# Patient Record
Sex: Female | Born: 1980 | Race: Black or African American | Hispanic: No | Marital: Single | Smoking: Former smoker
Health system: Southern US, Community
[De-identification: ages and names within clinical notes are randomized; demographics above are authoritative.]

## PROBLEM LIST (undated history)

## (undated) DIAGNOSIS — E876 Hypokalemia: Secondary | ICD-10-CM

## (undated) DIAGNOSIS — K5790 Diverticulosis of intestine, part unspecified, without perforation or abscess without bleeding: Secondary | ICD-10-CM

## (undated) DIAGNOSIS — K209 Esophagitis, unspecified without bleeding: Secondary | ICD-10-CM

## (undated) DIAGNOSIS — G43909 Migraine, unspecified, not intractable, without status migrainosus: Secondary | ICD-10-CM

## (undated) DIAGNOSIS — E669 Obesity, unspecified: Secondary | ICD-10-CM

## (undated) DIAGNOSIS — F419 Anxiety disorder, unspecified: Secondary | ICD-10-CM

## (undated) DIAGNOSIS — Z8489 Family history of other specified conditions: Secondary | ICD-10-CM

## (undated) DIAGNOSIS — Z8719 Personal history of other diseases of the digestive system: Secondary | ICD-10-CM

## (undated) DIAGNOSIS — J302 Other seasonal allergic rhinitis: Secondary | ICD-10-CM

## (undated) DIAGNOSIS — K449 Diaphragmatic hernia without obstruction or gangrene: Secondary | ICD-10-CM

## (undated) DIAGNOSIS — F329 Major depressive disorder, single episode, unspecified: Secondary | ICD-10-CM

## (undated) DIAGNOSIS — J45909 Unspecified asthma, uncomplicated: Secondary | ICD-10-CM

## (undated) DIAGNOSIS — M1990 Unspecified osteoarthritis, unspecified site: Secondary | ICD-10-CM

## (undated) HISTORY — DX: Esophagitis, unspecified without bleeding: K20.90

## (undated) HISTORY — DX: Hypokalemia: E87.6

## (undated) HISTORY — DX: Anxiety disorder, unspecified: F41.9

## (undated) HISTORY — DX: Diaphragmatic hernia without obstruction or gangrene: K44.9

## (undated) HISTORY — DX: Personal history of other diseases of the digestive system: Z87.19

## (undated) HISTORY — DX: Unspecified osteoarthritis, unspecified site: M19.90

## (undated) HISTORY — PX: WISDOM TOOTH EXTRACTION: SHX21

## (undated) HISTORY — DX: Major depressive disorder, single episode, unspecified: F32.9

## (undated) HISTORY — DX: Migraine, unspecified, not intractable, without status migrainosus: G43.909

## (undated) HISTORY — PX: BREAST BIOPSY: SHX20

## (undated) HISTORY — DX: Diverticulosis of intestine, part unspecified, without perforation or abscess without bleeding: K57.90

---
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
## 2011-10-10 DIAGNOSIS — Z8719 Personal history of other diseases of the digestive system: Secondary | ICD-10-CM

## 2011-10-10 HISTORY — DX: Personal history of other diseases of the digestive system: Z87.19

## 2012-10-09 DIAGNOSIS — M1990 Unspecified osteoarthritis, unspecified site: Secondary | ICD-10-CM

## 2012-10-09 HISTORY — DX: Unspecified osteoarthritis, unspecified site: M19.90

## 2012-10-09 NOTE — L&D Delivery Note (Signed)
Delivery Note At 1106 I was notified pt had developed increased bp's 140s-160s/60s-80s, with one outlying bp of 177/122. Pt also feeling lots of pressure and breathing/pushing w/ uc's. Pre-e labs and labetalol prn were ordered. She did receive 1 dose of labetalol 20mg  iv just prior to delivery. Denies  scotomata, ruq/epigastric pain, n/v. Reports having frontal ha since last night, but thinks it's r/t allergies. DTRs 2+, no clonus, trace BLE edema. At 11:35 AM a viable female was delivered via Vaginal, Spontaneous Delivery (Presentation:Left Occiput Anterior).  APGAR: 8, 9; weight: not available at time of note.   Placenta status: Intact, Spontaneous.  Cord:  with the following complications: hyperspiraled.    Anesthesia: Epidural  Episiotomy: n/a Lacerations: small hemostatic 1st degree bilateral periurethral, no repair needed Suture Repair: n/a Est. Blood Loss (mL):  Mom to postpartum.  Baby to Couplet care / Skin to Skin. Plans to breastfeed, undecided about contraception, desires OP circumcision.  Marge Duncans 08/23/2013, 11:51 AM

## 2012-12-20 ENCOUNTER — Emergency Department (HOSPITAL_COMMUNITY)
Admission: EM | Admit: 2012-12-20 | Discharge: 2012-12-20 | Disposition: A | Discharge status: Home / Self Care | Payer: Medicaid Other | Attending: Emergency Medicine | Admitting: Emergency Medicine

## 2012-12-20 ENCOUNTER — Encounter (HOSPITAL_COMMUNITY): Payer: Self-pay | Admitting: *Deleted

## 2012-12-20 DIAGNOSIS — R350 Frequency of micturition: Secondary | ICD-10-CM | POA: Insufficient documentation

## 2012-12-20 DIAGNOSIS — R112 Nausea with vomiting, unspecified: Secondary | ICD-10-CM | POA: Insufficient documentation

## 2012-12-20 DIAGNOSIS — E669 Obesity, unspecified: Secondary | ICD-10-CM | POA: Insufficient documentation

## 2012-12-20 DIAGNOSIS — R109 Unspecified abdominal pain: Secondary | ICD-10-CM

## 2012-12-20 DIAGNOSIS — R197 Diarrhea, unspecified: Secondary | ICD-10-CM | POA: Insufficient documentation

## 2012-12-20 DIAGNOSIS — R1013 Epigastric pain: Secondary | ICD-10-CM | POA: Insufficient documentation

## 2012-12-20 HISTORY — DX: Other seasonal allergic rhinitis: J30.2

## 2012-12-20 HISTORY — DX: Obesity, unspecified: E66.9

## 2012-12-20 LAB — URINALYSIS, ROUTINE W REFLEX MICROSCOPIC
Ketones, ur: 15 mg/dL — AB
Leukocytes, UA: NEGATIVE
Nitrite: NEGATIVE
Urobilinogen, UA: 0.2 mg/dL (ref 0.0–1.0)
pH: 6 (ref 5.0–8.0)

## 2012-12-20 MED ORDER — GI COCKTAIL ~~LOC~~
30.0000 mL | Freq: Once | ORAL | Status: AC
Start: 2012-12-20 — End: 2012-12-20
  Administered 2012-12-20: 30 mL via ORAL
  Filled 2012-12-20: qty 30

## 2012-12-20 MED ORDER — ONDANSETRON 4 MG PO TBDP
4.0000 mg | ORAL_TABLET | Freq: Once | ORAL | Status: AC
Start: 2012-12-20 — End: 2012-12-20
  Administered 2012-12-20: 4 mg via ORAL
  Filled 2012-12-20: qty 1

## 2012-12-20 MED ORDER — PROMETHAZINE HCL 25 MG PO TABS: 25.0000 mg | ORAL_TABLET | Freq: Four times a day (QID) | ORAL | Status: DC | PRN

## 2012-12-20 NOTE — ED Provider Notes (Signed)
History     CSN: [id number]  Arrival date & time 12/20/12  4098   First MD Initiated Contact with Patient 12/20/12 [phone]      Chief Complaint  Patient presents with  . Abdominal Pain  . Diarrhea    (Consider location/radiation/quality/duration/timing/severity/associated sxs/prior treatment) HPI Comments: Patient presents with a chief complaint of nausea, vomiting, and diarrhea.  She is also having some mild intermittent epigastric pain.  She describes the pain as a crampy pain.  Pain does not radiate.  Symptoms started yesterday.  She reports that she has had several episodes of diarrhea and one episode of vomiting.  No blood in her emesis or blood in her stool.  She has not taken anything for her symptoms prior to arrival.  She denies fever or chills.   She has some increased urinary frequency, but no dysuria or hematuria.  No prior history of abdominal surgeries.    Patient is a 32 y.o. female presenting with diarrhea. The history is provided by the patient.  Diarrhea Associated symptoms: abdominal pain and vomiting   Associated symptoms: no chills and no fever     Past Medical History  Diagnosis Date  . Obesity   . Seasonal allergies     History reviewed. No pertinent past surgical history.  History reviewed. No pertinent family history.  History  Substance Use Topics  . Smoking status: Not on file  . Smokeless tobacco: Not on file  . Alcohol Use: No    OB History   Grav Para Term Preterm Abortions TAB SAB Ect Mult Living                  Review of Systems  Constitutional: Negative for fever and chills.  Gastrointestinal: Positive for nausea, vomiting, abdominal pain and diarrhea. Negative for constipation, blood in stool and abdominal distention.  Genitourinary: Positive for frequency. Negative for urgency and pelvic pain.  All other systems reviewed and are negative.    Allergies  Chocolate; Coffee bean extract; Soy allergy; Tea; and Yeast-related  products  Home Medications   Current Outpatient Rx  Name  Route  Sig  Dispense  Refill  . naphazoline-pheniramine (NAPHCON-A) 0.025-0.3 % ophthalmic solution   Both Eyes   Place 1 drop into both eyes 4 (four) times daily as needed.         . naproxen sodium (ANAPROX) 220 MG tablet   Oral   Take 440 mg by mouth 2 (two) times daily as needed.           BP 107/59  Pulse 80  Temp(Src) 98 F (36.7 C) (Oral)  SpO2 97%  LMP 11/17/2012  Physical Exam  Nursing note and vitals reviewed. Constitutional: She appears well-developed and well-nourished. No distress.  HENT:  Head: Normocephalic and atraumatic.  Mouth/Throat: Oropharynx is clear and moist.  Neck: Normal range of motion. Neck supple.  Cardiovascular: Normal rate, regular rhythm and normal heart sounds.   Pulmonary/Chest: Effort normal and breath sounds normal.  Abdominal: Soft. Bowel sounds are normal. She exhibits no distension and no mass. There is no rebound, no guarding, no CVA tenderness, no tenderness at McBurney's point and negative Murphy's sign.  Mild epigastric tenderness to palpation  Musculoskeletal: Normal range of motion.  Neurological: She is alert.  Skin: Skin is warm and dry. She is not diaphoretic.  Psychiatric: She has a normal mood and affect.    ED Course  Procedures (including critical care time)  Labs Reviewed  URINALYSIS, ROUTINE W REFLEX  MICROSCOPIC - Abnormal; Notable for the following:    Ketones, ur 15 (*)    All other components within normal limits   No results found.   No diagnosis found.  Patient able to tolerate PO liquids.  On re examination patient did not have any tenderness to palpation of the abdomen.  MDM  Patient presents today with a chief complaint of nausea, vomiting, and diarrhea.  Symptoms improved while in the ED.  No rebound or guarding on physical exam.  Patient able to tolerate PO liquids.  Patient discharged home with Rx for Phenergan.  Return precautions  discussed.          Pascal Lux Cecil, PA-C 12/21/12 1719

## 2012-12-20 NOTE — ED Notes (Signed)
Patient given urine cup to get sample per PA request.

## 2012-12-20 NOTE — ED Notes (Signed)
AIDET performed. 

## 2012-12-20 NOTE — ED Notes (Signed)
Pt ambulatory leaving ED; pt alert and mentating appropriately upon d/c teaching and prescription teaching; pt given d/c teaching and prescription; pt verbalizes understanding of d/c teaching and prescription, pt has no further questions upon d/c. NAD noted upon d/c.

## 2012-12-20 NOTE — ED Notes (Signed)
Mid abd pain that started yesterday, having nausea and mild diarrhea.

## 2012-12-20 NOTE — ED Notes (Signed)
Pt given ginger ale in attempt to PO challenge pt; pt informed to call out if gets nauseous; pt denies nausea currently; pt alert and mentating appropriately.

## 2012-12-20 NOTE — ED Notes (Signed)
Pt states she has been able to keep some of the ginger ale down

## 2012-12-22 NOTE — ED Provider Notes (Signed)
Medical screening examination/treatment/procedure(s) were performed by non-physician practitioner and as supervising physician I was immediately available for consultation/collaboration.  Flint Melter, MD 12/22/12 [phone]

## 2013-01-01 ENCOUNTER — Emergency Department (HOSPITAL_COMMUNITY)
Admission: EM | Admit: 2013-01-01 | Discharge: 2013-01-01 | Disposition: A | Discharge status: Home / Self Care | Payer: Medicaid Other | Attending: Emergency Medicine | Admitting: Emergency Medicine

## 2013-01-01 ENCOUNTER — Emergency Department (HOSPITAL_COMMUNITY): Payer: Medicaid Other

## 2013-01-01 ENCOUNTER — Encounter (HOSPITAL_COMMUNITY): Payer: Self-pay

## 2013-01-01 DIAGNOSIS — R109 Unspecified abdominal pain: Secondary | ICD-10-CM | POA: Insufficient documentation

## 2013-01-01 DIAGNOSIS — Z3201 Encounter for pregnancy test, result positive: Secondary | ICD-10-CM | POA: Insufficient documentation

## 2013-01-01 DIAGNOSIS — Z3490 Encounter for supervision of normal pregnancy, unspecified, unspecified trimester: Secondary | ICD-10-CM

## 2013-01-01 DIAGNOSIS — E669 Obesity, unspecified: Secondary | ICD-10-CM | POA: Insufficient documentation

## 2013-01-01 DIAGNOSIS — R3589 Other polyuria: Secondary | ICD-10-CM | POA: Insufficient documentation

## 2013-01-01 DIAGNOSIS — R358 Other polyuria: Secondary | ICD-10-CM | POA: Insufficient documentation

## 2013-01-01 LAB — URINALYSIS, ROUTINE W REFLEX MICROSCOPIC
Bilirubin Urine: NEGATIVE
Hgb urine dipstick: NEGATIVE
Ketones, ur: NEGATIVE mg/dL
Nitrite: NEGATIVE
Protein, ur: NEGATIVE mg/dL
Specific Gravity, Urine: 1.023 (ref 1.005–1.030)
Urobilinogen, UA: 0.2 mg/dL (ref 0.0–1.0)

## 2013-01-01 LAB — WET PREP, GENITAL: Trich, Wet Prep: NONE SEEN

## 2013-01-01 LAB — HCG, QUANTITATIVE, PREGNANCY: hCG, Beta Chain, Quant, S: 44581 m[IU]/mL — ABNORMAL HIGH (ref <5)

## 2013-01-01 MED ORDER — PRENATAL VITAMINS 0.8 MG PO TABS: 1.0000 | ORAL_TABLET | Freq: Every day | ORAL | Status: DC

## 2013-01-01 NOTE — ED Notes (Signed)
Pt returned from ultrasound.   Placed back on monitor.

## 2013-01-01 NOTE — ED Notes (Signed)
Lower abdominal pain for 1 month, bilateral breast pain , sore to touch,  Pt. Is voiding a lot, denies any dysuria. Pt. Is late for her period , 16 days.  Pt, is nauseated  Denies any vomiting.

## 2013-01-01 NOTE — ED Notes (Signed)
Pt called for triage x3 

## 2013-01-01 NOTE — ED Provider Notes (Signed)
History     CSN: [id number]  Arrival date & time 01/01/13  1538   First MD Initiated Contact with Patient 01/01/13 1657      Chief Complaint  Patient presents with  . Breast Pain    (Consider location/radiation/quality/duration/timing/severity/associated sxs/prior treatment) HPI Complete of nausea, intermittent low abdominal pain for 2 weeks patient reports breast tenderness for several weeks, patient reports that she is late for her menstrual period. Last normal limits her. Presently 6 weeks ago. She had an episode of spotting yesterday. Other associated symptoms include polyuria. No treatment prior to coming here nothing makes symptoms better or worse she denies abdominal discomfort presently. Patient states blood type is B. positive Past Medical History  Diagnosis Date  . Obesity   . Seasonal allergies     History reviewed. No pertinent past surgical history.  No family history on file.  History  Substance Use Topics  . Smoking status: Never Smoker   . Smokeless tobacco: Not on file  . Alcohol Use: No    OB History   Grav Para Term Preterm Abortions TAB SAB Ect Mult Living                  Review of Systems  Constitutional: Negative.   HENT: Negative.   Respiratory: Negative.   Cardiovascular: Negative.   Gastrointestinal: Positive for abdominal pain.  Genitourinary:       Late for menses, breast tenderness,  Musculoskeletal: Negative.   Skin: Negative.   Neurological: Negative.   Psychiatric/Behavioral: Negative.   All other systems reviewed and are negative.    Allergies  Chocolate; Coffee bean extract; Soy allergy; Tea; and Yeast-related products  Home Medications  No current outpatient prescriptions on file.  BP 134/79  Pulse 63  Temp(Src) 98.4 F (36.9 C) (Oral)  Resp 16  SpO2 100%  LMP 11/18/2012  Physical Exam  Nursing note and vitals reviewed. Constitutional: She appears well-developed and well-nourished.  HENT:  Head: Normocephalic  and atraumatic.  Eyes: Conjunctivae are normal. Pupils are equal, round, and reactive to light.  Neck: Neck supple. No tracheal deviation present. No thyromegaly present.  Cardiovascular: Normal rate and regular rhythm.   No murmur heard. Pulmonary/Chest: Effort normal and breath sounds normal.  Abdominal: Soft. Bowel sounds are normal. She exhibits no distension. There is no tenderness.  Genitourinary: Vagina normal.  No external lesion. Minimal white discharge in vault. No blood in vault. Cervical os closed no cervical motion tenderness no adnexal masses or tenderness  Musculoskeletal: Normal range of motion. She exhibits no edema and no tenderness.  Neurological: She is alert. Coordination normal.  Skin: Skin is warm and dry. No rash noted.  Psychiatric: She has a normal mood and affect.    ED Course  Procedures (including critical care time)  Labs Reviewed  POCT PREGNANCY, URINE - Abnormal; Notable for the following:    Preg Test, Ur POSITIVE (*)    All other components within normal limits  URINALYSIS, ROUTINE W REFLEX MICROSCOPIC   No results found.   No diagnosis found. Results for orders placed during the hospital encounter of 01/01/13  WET PREP, GENITAL      Result Value Range   Yeast Wet Prep HPF POC NONE SEEN  NONE SEEN   Trich, Wet Prep NONE SEEN  NONE SEEN   Clue Cells Wet Prep HPF POC FEW (*) NONE SEEN   WBC, Wet Prep HPF POC MANY (*) NONE SEEN  URINALYSIS, ROUTINE W REFLEX MICROSCOPIC  Result Value Range   Color, Urine YELLOW  YELLOW   APPearance CLEAR  CLEAR   Specific Gravity, Urine 1.023  1.005 - 1.030   pH 6.5  5.0 - 8.0   Glucose, UA NEGATIVE  NEGATIVE mg/dL   Hgb urine dipstick NEGATIVE  NEGATIVE   Bilirubin Urine NEGATIVE  NEGATIVE   Ketones, ur NEGATIVE  NEGATIVE mg/dL   Protein, ur NEGATIVE  NEGATIVE mg/dL   Urobilinogen, UA 0.2  0.0 - 1.0 mg/dL   Nitrite NEGATIVE  NEGATIVE   Leukocytes, UA NEGATIVE  NEGATIVE  HCG, QUANTITATIVE, PREGNANCY       Result Value Range   hCG, Beta Chain, Quant, S 44581 (*) <5 mIU/mL  POCT PREGNANCY, URINE      Result Value Range   Preg Test, Ur POSITIVE (*) NEGATIVE   US Ob Comp Less 14 Wks  01/01/2013  *RADIOLOGY REPORT*  Clinical Data: Lower abdominal pain, positive pregnancy test  OBSTETRIC <14 WK [name] AND TRANSVAGINAL OB US  Technique:  Both transabdominal and transvaginal ultrasound examinations were performed for complete evaluation of the gestation as well as the maternal uterus, adnexal regions, and pelvic cul-de-sac.  Transvaginal technique was performed to assess early pregnancy.  Comparison:  None.  Intrauterine gestational sac:  Visualized/normal in shape. Yolk sac: Visualized Embryo: Visualized Cardiac Activity: Visualized Heart Rate: 130 bpm  CRL: 7  mm  6 w  4 d          [name] EDC: 08/23/13  Maternal uterus/adnexae: The ovaries are normal.  Small amount of free fluid identified.  IMPRESSION: Intrauterine gestational sac, yolk sac, fetal pole, and cardiac activity noted.  Today's measurement by crown-rump length of 6 weeks 4 days gestational age is concordant with assigned gestational age by LMP of 6 weeks 2 days.  EDC by LMP 08/25/2013.   Original Report Authenticated By: Christiana Pellant, M.D.    US Ob Transvaginal  01/01/2013  *RADIOLOGY REPORT*  Clinical Data: Lower abdominal pain, positive pregnancy test  OBSTETRIC <14 WK [name] AND TRANSVAGINAL OB US  Technique:  Both transabdominal and transvaginal ultrasound examinations were performed for complete evaluation of the gestation as well as the maternal uterus, adnexal regions, and pelvic cul-de-sac.  Transvaginal technique was performed to assess early pregnancy.  Comparison:  None.  Intrauterine gestational sac:  Visualized/normal in shape. Yolk sac: Visualized Embryo: Visualized Cardiac Activity: Visualized Heart Rate: 130 bpm  CRL: 7  mm  6 w  4 d          [name] EDC: 08/23/13  Maternal uterus/adnexae: The ovaries are normal.  Small amount of free fluid  identified.  IMPRESSION: Intrauterine gestational sac, yolk sac, fetal pole, and cardiac activity noted.  Today's measurement by crown-rump length of 6 weeks 4 days gestational age is concordant with assigned gestational age by LMP of 6 weeks 2 days.  EDC by LMP 08/25/2013.   Original Report Authenticated By: Christiana Pellant, M.D.     8:35 PM resting comfortable  MDM  Plan referral women's health clinic Prescription prenatal vitamins Diagnosis intrauterine pregnancy        Doug Sou, MD 01/01/13 2041

## 2013-01-01 NOTE — ED Notes (Signed)
Pt called for triage x2 

## 2013-01-01 NOTE — ED Notes (Signed)
Patient with child in [facility] ED

## 2013-01-02 LAB — GC/CHLAMYDIA PROBE AMP: CT Probe RNA: NEGATIVE

## 2013-01-21 ENCOUNTER — Other Ambulatory Visit: Payer: Medicaid Other

## 2013-01-21 DIAGNOSIS — Z3201 Encounter for pregnancy test, result positive: Secondary | ICD-10-CM

## 2013-01-21 LAB — HIV ANTIBODY (ROUTINE TESTING W REFLEX): HIV: NONREACTIVE

## 2013-01-22 ENCOUNTER — Other Ambulatory Visit: Payer: Medicaid Other

## 2013-01-22 LAB — OBSTETRIC PANEL
Basophils Absolute: 0 10*3/uL (ref 0.0–0.1)
Eosinophils Relative: 1 % (ref 0–5)
Hepatitis B Surface Ag: NEGATIVE
Lymphocytes Relative: 22 % (ref 12–46)
Lymphs Abs: 1.6 10*3/uL (ref 0.7–4.0)
Neutro Abs: 5.1 10*3/uL (ref 1.7–7.7)
Neutrophils Relative %: 70 % (ref 43–77)
Platelets: 229 10*3/uL (ref 150–400)
RBC: 4.5 MIL/uL (ref 3.87–5.11)
RDW: 13.9 % (ref 11.5–15.5)
Rubella: 2.77 Index — ABNORMAL HIGH (ref <0.90)
WBC: 7.2 10*3/uL (ref 4.0–10.5)

## 2013-01-23 LAB — HEMOGLOBINOPATHY EVALUATION
Hemoglobin Other: 0 %
Hgb F Quant: 0 % (ref 0.0–2.0)
Hgb S Quant: 0 %

## 2013-02-18 ENCOUNTER — Encounter: Payer: Self-pay | Admitting: Obstetrics & Gynecology

## 2013-02-18 ENCOUNTER — Other Ambulatory Visit: Payer: Self-pay | Admitting: Obstetrics & Gynecology

## 2013-02-18 ENCOUNTER — Other Ambulatory Visit (HOSPITAL_COMMUNITY)
Admission: RE | Admit: 2013-02-18 | Discharge: 2013-02-18 | Disposition: A | Discharge status: Home / Self Care | Payer: Medicaid Other | Source: Ambulatory Visit | Attending: Obstetrics & Gynecology | Admitting: Obstetrics & Gynecology

## 2013-02-18 ENCOUNTER — Ambulatory Visit (INDEPENDENT_AMBULATORY_CARE_PROVIDER_SITE_OTHER): Payer: Medicaid Other | Admitting: Obstetrics & Gynecology

## 2013-02-18 VITALS — BP 114/77 | Temp 98.1°F | Ht 61.0 in | Wt 203.7 lb

## 2013-02-18 DIAGNOSIS — Z3481 Encounter for supervision of other normal pregnancy, first trimester: Secondary | ICD-10-CM

## 2013-02-18 DIAGNOSIS — Z01419 Encounter for gynecological examination (general) (routine) without abnormal findings: Secondary | ICD-10-CM | POA: Insufficient documentation

## 2013-02-18 DIAGNOSIS — Z1151 Encounter for screening for human papillomavirus (HPV): Secondary | ICD-10-CM | POA: Insufficient documentation

## 2013-02-18 DIAGNOSIS — Z113 Encounter for screening for infections with a predominantly sexual mode of transmission: Secondary | ICD-10-CM | POA: Insufficient documentation

## 2013-02-18 DIAGNOSIS — Z3480 Encounter for supervision of other normal pregnancy, unspecified trimester: Secondary | ICD-10-CM | POA: Insufficient documentation

## 2013-02-18 LAB — POCT URINALYSIS DIP (DEVICE)
Bilirubin Urine: NEGATIVE
Hgb urine dipstick: NEGATIVE
Ketones, ur: NEGATIVE mg/dL
Nitrite: NEGATIVE
Protein, ur: 30 mg/dL — AB
pH: 6.5 (ref 5.0–8.0)

## 2013-02-18 LAB — GLUCOSE TOLERANCE, 1 HOUR (50G) W/O FASTING: Glucose, 1 Hour GTT: 121 mg/dL (ref 70–140)

## 2013-02-18 NOTE — Progress Notes (Signed)
Informal [name] for viability- +FM, +cardiac, FHR 158 per m-mode.

## 2013-02-18 NOTE — Progress Notes (Signed)
Pulse- 78  Pain/pressure- lower abd then shots to vaginal area New ob packet given  Weight gain 11-20lb  Declined flu vaccine

## 2013-02-18 NOTE — Progress Notes (Signed)
Subjective:    Lynn Wilcox is being seen today for her first obstetrical visit.  This is not a planned pregnancy. She is at [redacted]w[redacted]d gestation. Her obstetrical history is significant for obesity. Relationship with FOB: significant other, not living together. Patient does not intend to breast feed. Pregnancy history fully reviewed.  Menstrual History: OB History   Grav Para Term Preterm Abortions TAB SAB Ect Mult Living   2 1 1       1       Patient's last menstrual period was 11/18/2012.    The following portions of the patient's history were reviewed and updated as appropriate: allergies, current medications, past family history, past medical history, past social history, past surgical history and problem list.  Review of Systems Pertinent items are noted in HPI.    Objective:    BP 114/77  Temp(Src) 98.1 F (36.7 C)  Ht 5\' 1"  (1.549 m)  Wt 203 lb 11.2 oz (92.398 kg)  BMI 38.51 kg/m2  LMP 11/18/2012  General Appearance:    Alert, cooperative, no distress, appears stated age  Head:    Normocephalic, without obvious abnormality, atraumatic           Throat:   Lips, mucosa, and tongue normal; teeth and gums normal  Neck:   Supple, symmetrical, trachea midline, no adenopathy;    thyroid:  no enlargement/tenderness/nodules; no carotid   bruit or JVD  Back:     Symmetric, no curvature, ROM normal, no CVA tenderness  Lungs:     Clear to auscultation bilaterally, respirations unlabored  Chest Wall:    No tenderness or deformity   Heart:    Regular rate and rhythm, S1 and S2 normal, no murmur, rub   or gallop  Breast Exam:    No tenderness, masses, or nipple abnormality  Abdomen:     Soft, non-tender, bowel sounds active all four quadrants,    no masses, no organomegaly  Genitalia:    Normal female without lesion, discharge or tenderness: uterus ~16 weeks size difficult to assess due to body habitus     Extremities:   Extremities normal, atraumatic, no cyanosis or edema  Pulses:    2+ and symmetric all extremities  Skin:   Skin color, texture, turgor normal, no rashes or lesions  Lymph nodes:   Cervical, supraclavicular, and axillary nodes normal    Assessment:    Pregnancy at 13 and 3/7 weeks  C/o excess fatigue - will obtain TSH  Quick look sono today to check FHT   Plan:    Initial labs drawn. Prenatal vitamins. Problem list reviewed and updated. Follow up in 4 weeks. Lab: early 1 hr GTT and TSH today

## 2013-02-18 NOTE — Patient Instructions (Addendum)
Prenatal Care  WHAT IS PRENATAL CARE?  Prenatal care means health care during your pregnancy, before your baby is born. Take care of yourself and your baby by:   Getting early prenatal care. If you know you are pregnant, or think you might be pregnant, call your caregiver as soon as possible. Schedule a visit for a general/prenatal examination.  Getting regular prenatal care. Follow your caregiver's schedule for blood and other necessary tests. Do not miss appointments.  Do everything you can to keep yourself and your baby healthy during your pregnancy.  Prenatal care should include evaluation of medical, dietary, educational, psychological, and social needs for the couple and the medical, surgical, and genetic history of the family of the mother and father.  Discuss with your caregiver:  Your medicines, prescription, over-the-counter, and herbal medicines.  Substance abuse, alcohol, smoking, and illegal drugs.  Domestic abuse and violence, if present.  Your immunizations.  Nutrition and diet.  Exercising.  Environment and occupational hazards, at home and at work.  History of sexually transmitted disease, both you and your partner.  Previous pregnancies. WHY IS PRENATAL CARE SO IMPORTANT?  By seeing you regularly, your caregiver has the chance to find problems early, so that they can be treated as soon as possible. Other problems might be prevented. Many studies have shown that early and regular prenatal care is important for the health of both mothers and their babies.  I AM THINKING ABOUT GETTING PREGNANT. HOW CAN I TAKE CARE OF MYSELF?  Taking care of yourself before you get pregnant helps you to have a healthy pregnancy. It also lowers your chances of having a baby born with a birth defect. Here are ways to take care of yourself before you get pregnant:   Eat healthy foods, exercise regularly (30 minutes per day for most days of the week is best), and get enough rest and  sleep. Talk to your caregiver about what kinds of foods and exercises are best for you.  Take 400 micrograms (mcg) of folic acid (one of the B vitamins) every day. The best way to do this is to take a daily multivitamin pill that contains this amount of folic acid. Getting enough of the synthetic (manufactured) form of folic acid every day before you get pregnant and during early pregnancy can help prevent certain birth defects. Many breakfast cereals and other grain products have folic acid added to them, but only certain cereals contain 400 mcg of folic acid per serving. Check the label on your multivitamin or cereal to find the amount of folic acid in the food.  See your caregiver for a complete check up before getting pregnant. Make sure that you have had all your immunization shots, especially for rubella ([place] measles). Rubella can cause serious birth defects. Chickenpox is another illness you want to avoid during pregnancy. If you have had chickenpox and rubella in the past, you should be immune to them.  Tell your caregiver about any prescription or non-prescription medicines (including herbal remedies) you are taking. Some medicines are not safe to take during pregnancy.  Stop smoking cigarettes, drinking alcohol, or taking illegal drugs. Ask your caregiver for help, if you need it. You can also get help with alcohol and drugs by talking with a member of your faith community, a counselor, or a trusted friend.  Discuss and treat any medical, social, or psychological problems before getting pregnant.  Discuss any history of genetic problems in the mother, father, and their families. Do  genetic testing before getting pregnant, when possible.  Discuss any physical or emotional abuse with your caregiver.  Discuss with your caregiver if you might be exposed to harmful chemicals on your job or where you live.  Discuss with your caregiver if you think your job or the hours you work may be  harmful and should be changed.  The father should be involved with the decision making and with all aspects of the pregnancy, labor, and delivery.  If you have medical insurance, make sure you are covered for pregnancy. I JUST FOUND OUT THAT I AM PREGNANT. HOW CAN I TAKE CARE OF MYSELF?  Here are ways to take care of yourself and the precious new life growing inside you:   Continue taking your multivitamin with 400 micrograms (mcg) of folic acid every day.  Get early and regular prenatal care. It does not matter if this is your first pregnancy or if you already have children. It is very important to see a caregiver during your pregnancy. Your caregiver will check at each visit to make sure that you and the baby are healthy. If there are any problems, action can be taken right away to help you and the baby.  Eat a healthy diet that includes:  Fruits.  Vegetables.  Foods low in saturated fat.  Grains.  Calcium-rich foods.  Drink 6 to 8 glasses of liquids a day.  Unless your caregiver tells you not to, try to be physically active for 30 minutes, most days of the week. If you are pressed for time, you can get your activity in through 10 minute segments, three times a day.  If you smoke, drink alcohol, or use drugs, STOP. These can cause long-term damage to your baby. Talk with your caregiver about steps to take to stop smoking. Talk with a member of your faith community, a counselor, a trusted friend, or your caregiver if you are concerned about your alcohol or drug use.  Ask your caregiver before taking any medicine, even over-the-counter medicines. Some medicines are not safe to take during pregnancy.  Get plenty of rest and sleep.  Avoid hot tubs and saunas during pregnancy.  Do not have X-rays taken, unless absolutely necessary and with the recommendation of your caregiver. A lead shield can be placed on your abdomen, to protect the baby when X-rays are taken in other parts of the  body.  Do not empty the cat litter when you are pregnant. It may contain a parasite that causes an infection called toxoplasmosis, which can cause birth defects. Also, use gloves when working in garden areas used by cats.  Do not eat uncooked or undercooked cheese, meats, or fish.  Stay away from toxic chemicals like:  Insecticides.  Solvents (some cleaners or paint thinners).  Lead.  Mercury.  Sexual relations may continue until the end of the pregnancy, unless you have a medical problem or there is a problem with the pregnancy and your caregiver tells you not to.  Do not wear high heel shoes, especially during the second half of the pregnancy. You can lose your balance and fall.  Do not take long trips, unless absolutely necessary. Be sure to see your caregiver before going on the trip.  Do not sit in one position for more than 2 hours, when on a trip.  Take a copy of your medical records when going on a trip.  Know where there is a hospital in the city you are visiting, in case of an  emergency.  Most dangerous household products will have pregnancy warnings on their labels. Ask your caregiver about products if you are unsure.  Limit or eliminate your caffeine intake from coffee, tea, sodas, medicines, and chocolate.  Many women continue working through pregnancy. Staying active might help you stay healthier. If you have a question about the safety or the hours you work at your particular job, talk with your caregiver.  Get informed:  Read books.  Watch videos.  Go to childbirth classes for you and the father.  Talk with experienced moms.  Ask your caregiver about childbirth education classes for you and your partner. Classes can help you and your partner prepare for the birth of your baby.  Ask about a pediatrician (baby doctor) and methods and pain medicine for labor, delivery, and possible Cesarean delivery (C-section). I AM NOT THINKING ABOUT GETTING PREGNANT  RIGHT NOW, BUT HEARD THAT ALL WOMEN SHOULD TAKE FOLIC ACID EVERY DAY?  All women of childbearing age, with even a remote chance of getting pregnant, should try to make sure they get enough folic acid. Many pregnancies are not planned. Many women do not know they are actually pregnant early in their pregnancies, and certain birth defects happen in the very early part of pregnancy. Taking 400 micrograms (mcg) of folic acid every day will help prevent certain birth defects that happen in the early part of pregnancy. If a woman begins taking vitamin pills in the second or third month of pregnancy, it may be too late to prevent birth defects. Folic acid may also have other health benefits for women, besides preventing birth defects.  HOW OFTEN SHOULD I SEE MY CAREGIVER DURING PREGNANCY?  Your caregiver will give you a schedule for your prenatal visits. You will have visits more often as you get closer to the end of your pregnancy. An average pregnancy lasts about 40 weeks.  A typical schedule includes visiting your caregiver:   About once each month, during your first 6 months of pregnancy.  Every 2 weeks, during the next 2 months.  Weekly in the last month, until the delivery date. Your caregiver will probably want to see you more often if:  You are over 35.  Your pregnancy is high risk, because you have certain health problems or problems with the pregnancy, such as:  Diabetes.  High blood pressure.  The baby is not growing on schedule, according to the dates of the pregnancy. Your caregiver will do special tests, to make sure you and the baby are not having any serious problems. WHAT HAPPENS DURING PRENATAL VISITS?   At your first prenatal visit, your caregiver will talk to you about you and your partner's health history and your family's health history, and will do a physical exam.  On your first visit, a physical exam will include checks of your blood pressure, height and weight, and an  exam of your pelvic organs. Your caregiver will do a Pap test if you have not had one recently, and will do cultures of your cervix to make sure there is no infection.  At each visit, there will be tests of your blood, urine, blood pressure, weight, and checking the progress of the baby.  Your caregiver will be able to tell you when to expect that your baby will be born.  Each visit is also a chance for you to learn about staying healthy during pregnancy and for asking questions.  Discuss whether you will be breastfeeding.  At your later prenatal  visits, your caregiver will check how you are doing and how the baby is developing. You may have a number of tests done as your pregnancy progresses.  Ultrasound exams are often used to check on the baby's growth and health.  You may have more urine and blood tests, as well as special tests, if needed. These may include amniocentesis (examine fluid in the pregnancy sac), stress tests (check how baby responds to contractions), biophysical profile (measures fetus well-being). Your caregiver will explain the tests and why they are necessary. I AM IN MY LATE THIRTIES, AND I WANT TO HAVE A CHILD NOW. SHOULD I DO ANYTHING SPECIAL?  As you get older, there is more chance of having a medical problem (high blood pressure), pregnancy problem (preeclampsia, problems with the placenta), miscarriage, or a baby born with a birth defect. However, most women in their late thirties and early forties have healthy babies. See your caregiver on a regular basis before you get pregnant and be sure to go for exams throughout your pregnancy. Your caregiver probably will want to do some special tests to check on you and your baby's health when you are pregnant.  Women today are often delaying having children until later in life, when they are in their thirties and forties. While many women in their thirties and forties have no difficulty getting pregnant, fertility does decline  with age. For women over 40 who cannot get pregnant after 6 months of trying, it is recommended that they see their caregiver for a fertility evaluation. It is not uncommon to have trouble becoming pregnant or experience infertility (inability to become pregnant after trying for one year). If you think that you or your partner may be infertile, you can discuss this with your caregiver. He or she can recommend treatments such as drugs, surgery, or assisted reproductive technology.  Document Released: 09/28/2003 Document Revised: 12/18/2011 Document Reviewed: 08/25/2009 [facility] Patient Information 2013 [place], [place]. Breastfeeding Deciding to breastfeed is one of the best choices you can make for you and your baby. The information that follows gives a brief overview of the benefits of breastfeeding as well as common topics surrounding breastfeeding. BENEFITS OF BREASTFEEDING For the baby  The first milk (colostrum) helps the baby's digestive system function better.   There are antibodies in the mother's milk that help the baby fight off infections.   The baby has a lower incidence of asthma, allergies, and sudden infant death syndrome (SIDS).   The nutrients in breast milk are better for the baby than infant formulas, and breast milk helps the baby's brain grow better.   Babies who breastfeed have less gas, colic, and constipation.  For the mother  Breastfeeding helps develop a very special bond between the mother and her baby.   Breastfeeding is convenient, always available at the correct temperature, and costs nothing.   Breastfeeding burns calories in the mother and helps her lose weight that was gained during pregnancy.   Breastfeeding makes the uterus contract back down to normal size faster and slows bleeding following delivery.   Breastfeeding mothers have a lower risk of developing breast cancer.  BREASTFEEDING FREQUENCY  A healthy, full-term baby may breastfeed as  often as every hour or space his or her feedings to every 3 hours.   Watch your baby for signs of hunger. Nurse your baby if he or she shows signs of hunger. How often you nurse will vary from baby to baby.   Nurse as often as the baby  requests, or when you feel the need to reduce the fullness of your breasts.   Awaken the baby if it has been 3 4 hours since the last feeding.   Frequent feeding will help the mother make more milk and will help prevent problems, such as sore nipples and engorgement of the breasts.  BABY'S POSITION AT THE BREAST  Whether lying down or sitting, be sure that the baby's tummy is facing your tummy.   Support the breast with 4 fingers underneath the breast and the thumb above. Make sure your fingers are well away from the nipple and baby's mouth.   Stroke the baby's lips gently with your finger or nipple.   When the baby's mouth is open wide enough, place all of your nipple and as much of the areola as possible into your baby's mouth.   Pull the baby in close so the tip of the nose and the baby's cheeks touch the breast during the feeding.  FEEDINGS AND SUCTION  The length of each feeding varies from baby to baby and from feeding to feeding.   The baby must suck about 2 3 minutes for your milk to get to him or her. This is called a "let down." For this reason, allow the baby to feed on each breast as long as he or she wants. Your baby will end the feeding when he or she has received the right balance of nutrients.   To break the suction, put your finger into the corner of the baby's mouth and slide it between his or her gums before removing your breast from his or her mouth. This will help prevent sore nipples.  HOW TO TELL WHETHER YOUR BABY IS GETTING ENOUGH BREAST MILK. Wondering whether or not your baby is getting enough milk is a common concern among mothers. You can be assured that your baby is getting enough milk if:   Your baby is actively  sucking and you hear swallowing.   Your baby seems relaxed and satisfied after a feeding.   Your baby nurses at least 8 12 times in a 24 hour time period. Nurse your baby until he or she unlatches or falls asleep at the first breast (at least 10 20 minutes), then offer the second side.   Your baby is wetting 5 6 disposable diapers (6 8 cloth diapers) in a 24 hour period by 59 51 days of age.   Your baby is having at least 3 4 stools every 24 hours for the first 6 weeks. The stool should be soft and yellow.   Your baby should gain 4 7 ounces per week after he or she is 35 days old.   Your breasts feel softer after nursing.  REDUCING BREAST ENGORGEMENT  In the first week after your baby is born, you may experience signs of breast engorgement. When breasts are engorged, they feel heavy, warm, full, and may be tender to the touch. You can reduce engorgement if you:   Nurse frequently, every 2 3 hours. Mothers who breastfeed early and often have fewer problems with engorgement.   Place light ice packs on your breasts for 10 20 minutes between feedings. This reduces swelling. Wrap the ice packs in a lightweight towel to protect your skin. Bags of frozen vegetables work well for this purpose.   Take a warm shower or apply warm, moist heat to your breast for 5 10 minutes just before each feeding. This increases circulation and helps the milk flow.  Gently massage your breast before and during the feeding. Using your finger tips, massage from the chest wall towards your nipple in a circular motion.   Make sure that the baby empties at least one breast at every feeding before switching sides.   Use a breast pump to empty the breasts if your baby is sleepy or not nursing well. You may also want to pump if you are returning to work oryou feel you are getting engorged.   Avoid bottle feeds, pacifiers, or supplemental feedings of water or juice in place of breastfeeding. Breast milk is  all the food your baby needs. It is not necessary for your baby to have water or formula. In fact, to help your breasts make more milk, it is best not to give your baby supplemental feedings during the early weeks.   Be sure the baby is latched on and positioned properly while breastfeeding.   Wear a supportive bra, avoiding underwire styles.   Eat a balanced diet with enough fluids.   Rest often, relax, and take your prenatal vitamins to prevent fatigue, stress, and anemia.  If you follow these suggestions, your engorgement should improve in 24 48 hours. If you are still experiencing difficulty, call your lactation consultant or caregiver.  CARING FOR YOURSELF Take care of your breasts  Bathe or shower daily.   Avoid using soap on your nipples.   Start feedings on your left breast at one feeding and on your right breast at the next feeding.   You will notice an increase in your milk supply 2 5 days after delivery. You may feel some discomfort from engorgement, which makes your breasts very firm and often tender. Engorgement "peaks" out within 24 48 hours. In the meantime, apply warm moist towels to your breasts for 5 10 minutes before feeding. Gentle massage and expression of some milk before feeding will soften your breasts, making it easier for your baby to latch on.   Wear a well-fitting nursing bra, and air dry your nipples for a 3 after each feeding.   Only use cotton bra pads.   Only use pure lanolin on your nipples after nursing. You do not need to wash it off before feeding the baby again. Another option is to express a few drops of breast milk and gently massage it into your nipples.  Take care of yourself  Eat well-balanced meals and nutritious snacks.   Drinking milk, fruit juice, and water to satisfy your thirst (about 8 glasses a day).   Get plenty of rest.  Avoid foods that you notice affect the baby in a bad way.  SEEK MEDICAL CARE IF:    You have difficulty with breastfeeding and need help.   You have a hard, red, sore area on your breast that is accompanied by a fever.   Your baby is too sleepy to eat well or is having trouble sleeping.   Your baby is wetting less than 6 diapers a day, by 30 days of age.   Your baby's skin or white part of his or her eyes is more yellow than it was in the hospital.   You feel depressed.  Document Released: 09/25/2005 Document Revised: 03/26/2012 Document Reviewed: 12/24/2011 [facility] Patient Information 2013 [place], [place].

## 2013-02-19 ENCOUNTER — Encounter: Payer: Self-pay | Admitting: Obstetrics & Gynecology

## 2013-02-19 LAB — CULTURE, OB URINE: Organism ID, Bacteria: NO GROWTH

## 2013-03-18 ENCOUNTER — Encounter: Payer: Self-pay | Admitting: Obstetrics & Gynecology

## 2013-03-18 ENCOUNTER — Ambulatory Visit (INDEPENDENT_AMBULATORY_CARE_PROVIDER_SITE_OTHER): Payer: Medicaid Other | Admitting: Obstetrics & Gynecology

## 2013-03-18 VITALS — BP 104/72 | Temp 99.2°F | Wt 212.1 lb

## 2013-03-18 DIAGNOSIS — Z3480 Encounter for supervision of other normal pregnancy, unspecified trimester: Secondary | ICD-10-CM

## 2013-03-18 DIAGNOSIS — IMO0002 Reserved for concepts with insufficient information to code with codable children: Secondary | ICD-10-CM

## 2013-03-18 LAB — POCT URINALYSIS DIP (DEVICE)
Ketones, ur: 40 mg/dL — AB
Protein, ur: NEGATIVE mg/dL
Specific Gravity, Urine: >=1.03 (ref 1.005–1.030)
Urobilinogen, UA: 0.2 mg/dL (ref 0.0–1.0)
pH: 6 (ref 5.0–8.0)

## 2013-03-18 NOTE — Progress Notes (Signed)
Pt with no complaints.  No FM, No VB , no ctx Quad screen today sono in 2 weeks

## 2013-03-18 NOTE — Progress Notes (Signed)
Pulse- 88  Pain-cramping

## 2013-03-18 NOTE — Patient Instructions (Addendum)
AFP Maternal This is a routine screen (tests) used to check for fetal abnormalities such as Down syndrome and neural tube defects. Down Syndrome is a chromosomal abnormality, sometimes called Trisomy 61. Neural tube defects are serious birth defects. The brain, spinal cord, or their coverings do not develop completely. Women should be tested in the 15th to 20th week of pregnancy. The msAFP screen involves three or four tests that measure substances found in the blood that make the testing better. During development, AFP levels in fetal blood and amniotic fluid rise until about 12 weeks. The levels then gradually fall until birth. AFP is a protein produce by fetal tissue. AFP crosses the placenta and appears in the maternal blood. A baby with an open neural tube defect has an opening in its spine, head, or abdominal wall that allows higher-than-usual amounts of AFP to pass into the mother's blood. If a screen is positive, more tests are needed to make a diagnosis. These include ultrasound and perhaps amniocentesis (checking the fluid that surrounds the baby). These tests are used to help women and their caregivers make decisions about the management of their pregnancies. In pregnancies where the fetus is carrying the chromosomal defect that results in Down syndrome, the levels of AFP and unconjugated estriol tend to be low and hCG and inhibin A levels high.  PREPARATION FOR TEST Blood is drawn from a vein in your arm usually between the 15th and 20th weeks of pregnancy. Four different tests on your blood are done. These are AFP, hCG, unconjugated estriol, and inhibin A. The combination of tests produces a more accurate result. NORMAL FINDINGS   Adult: less than 40ng/mL or less than 40 mg/L (SI units)  Child younger than1 year: less than 30 ng/mL Ranges are stratified by weeks of gestation and vary among laboratories. Ranges for normal findings may vary among different laboratories and hospitals. You  should always check with your doctor after having lab work or other tests done to discuss the meaning of your test results and whether your values are considered within normal limits. MEANING OF TEST  These are screening tests. Not all fetal abnormalities will give positive test results. Of all women who have positive AFP screening results, only a very small number of them have babies who actually have a neural tube defect or chromosomal abnormality. Your caregiver will go over the test results with you and discuss the importance and meaning of your results, as well as treatment options and the need for additional tests if necessary. OBTAINING THE TEST RESULTS It is your responsibility to obtain your test results. Ask the lab or department performing the test when and how you will get your results. Document Released: 10/17/2004 Document Revised: 12/18/2011 Document Reviewed: 08/29/2008 [facility] Patient Information 2014 [place], [place]. Pregnancy - Second Trimester The second trimester of pregnancy (3 to 6 months) is a period of rapid growth for you and your baby. At the end of the sixth month, your baby is about 9 inches long and weighs 1 1/2 pounds. You will begin to feel the baby move between 18 and 20 weeks of the pregnancy. This is called quickening. Weight gain is faster. A clear fluid (colostrum) may leak out of your breasts. You may feel small contractions of the womb (uterus). This is known as false labor or Braxton-Hicks contractions. This is like a practice for labor when the baby is ready to be born. Usually, the problems with morning sickness have usually passed by the end of  your first trimester. Some women develop small dark blotches (called cholasma, mask of pregnancy) on their face that usually goes away after the baby is born. Exposure to the sun makes the blotches worse. Acne may also develop in some pregnant women and pregnant women who have acne, may find that it goes away. PRENATAL  EXAMS  Blood work may continue to be done during prenatal exams. These tests are done to check on your health and the probable health of your baby. Blood work is used to follow your blood levels (hemoglobin). Anemia (low hemoglobin) is common during pregnancy. Iron and vitamins are given to help prevent this. You will also be checked for diabetes between 24 and 28 weeks of the pregnancy. Some of the previous blood tests may be repeated.  The size of the uterus is measured during each visit. This is to make sure that the baby is continuing to grow properly according to the dates of the pregnancy.  Your blood pressure is checked every prenatal visit. This is to make sure you are not getting toxemia.  Your urine is checked to make sure you do not have an infection, diabetes or protein in the urine.  Your weight is checked often to make sure gains are happening at the suggested rate. This is to ensure that both you and your baby are growing normally.  Sometimes, an ultrasound is performed to confirm the proper growth and development of the baby. This is a test which bounces harmless sound waves off the baby so your caregiver can more accurately determine due dates. Sometimes, a test is done on the amniotic fluid surrounding the baby. This test is called an amniocentesis. The amniotic fluid is obtained by sticking a needle into the belly (abdomen). This is done to check the chromosomes in instances where there is a concern about possible genetic problems with the baby. It is also sometimes done near the end of pregnancy if an early delivery is required. In this case, it is done to help make sure the baby's lungs are mature enough for the baby to live outside of the womb. CHANGES OCCURING IN THE SECOND TRIMESTER OF PREGNANCY Your body goes through many changes during pregnancy. They vary from person to person. Talk to your caregiver about changes you notice that you are concerned about.  During the second  trimester, you will likely have an increase in your appetite. It is normal to have cravings for certain foods. This varies from person to person and pregnancy to pregnancy.  Your lower abdomen will begin to bulge.  You may have to urinate more often because the uterus and baby are pressing on your bladder. It is also common to get more bladder infections during pregnancy. You can help this by drinking lots of fluids and emptying your bladder before and after intercourse.  You may begin to get stretch marks on your hips, abdomen, and breasts. These are normal changes in the body during pregnancy. There are no exercises or medicines to take that prevent this change.  You may begin to develop swollen and bulging veins (varicose veins) in your legs. Wearing support hose, elevating your feet for 15 minutes, 3 to 4 times a day and limiting salt in your diet helps lessen the problem.  Heartburn may develop as the uterus grows and pushes up against the stomach. Antacids recommended by your caregiver helps with this problem. Also, eating smaller meals 4 to 5 times a day helps.  Constipation can be treated  with a stool softener or adding bulk to your diet. Drinking lots of fluids, and eating vegetables, fruits, and whole grains are helpful.  Exercising is also helpful. If you have been very active up until your pregnancy, most of these activities can be continued during your pregnancy. If you have been less active, it is helpful to start an exercise program such as walking.  Hemorrhoids may develop at the end of the second trimester. Warm sitz baths and hemorrhoid cream recommended by your caregiver helps hemorrhoid problems.  Backaches may develop during this time of your pregnancy. Avoid heavy lifting, wear low heal shoes, and practice good posture to help with backache problems.  Some pregnant women develop tingling and numbness of their hand and fingers because of swelling and tightening of ligaments  in the wrist (carpel tunnel syndrome). This goes away after the baby is born.  As your breasts enlarge, you may have to get a bigger bra. Get a comfortable, cotton, support bra. Do not get a nursing bra until the last month of the pregnancy if you will be nursing the baby.  You may get a dark line from your belly button to the pubic area called the linea nigra.  You may develop rosy cheeks because of increase blood flow to the face.  You may develop spider looking lines of the face, neck, arms, and chest. These go away after the baby is born. HOME CARE INSTRUCTIONS   It is extremely important to avoid all smoking, herbs, alcohol, and unprescribed drugs during your pregnancy. These chemicals affect the formation and growth of the baby. Avoid these chemicals throughout the pregnancy to ensure the delivery of a healthy infant.  Most of your home care instructions are the same as suggested for the first trimester of your pregnancy. Keep your caregiver's appointments. Follow your caregiver's instructions regarding medicine use, exercise, and diet.  During pregnancy, you are providing food for you and your baby. Continue to eat regular, well-balanced meals. Choose foods such as meat, fish, milk and other low fat dairy products, vegetables, fruits, and whole-grain breads and cereals. Your caregiver will tell you of the ideal weight gain.  A physical sexual relationship may be continued up until near the end of pregnancy if there are no other problems. Problems could include early (premature) leaking of amniotic fluid from the membranes, vaginal bleeding, abdominal pain, or other medical or pregnancy problems.  Exercise regularly if there are no restrictions. Check with your caregiver if you are unsure of the safety of some of your exercises. The greatest weight gain will occur in the last 2 trimesters of pregnancy. Exercise will help you:  Control your weight.  Get you in shape for labor and  delivery.  Lose weight after you have the baby.  Wear a good support or jogging bra for breast tenderness during pregnancy. This may help if worn during sleep. Pads or tissues may be used in the bra if you are leaking colostrum.  Do not use hot tubs, steam rooms or saunas throughout the pregnancy.  Wear your seat belt at all times when driving. This protects you and your baby if you are in an accident.  Avoid raw meat, uncooked cheese, cat litter boxes, and soil used by cats. These carry germs that can cause birth defects in the baby.  The second trimester is also a good time to visit your dentist for your dental health if this has not been done yet. Getting your teeth cleaned is okay.  Use a soft toothbrush. Brush gently during pregnancy.  It is easier to leak urine during pregnancy. Tightening up and strengthening the pelvic muscles will help with this problem. Practice stopping your urination while you are going to the bathroom. These are the same muscles you need to strengthen. It is also the muscles you would use as if you were trying to stop from passing gas. You can practice tightening these muscles up 10 times a set and repeating this about 3 times per day. Once you know what muscles to tighten up, do not perform these exercises during urination. It is more likely to contribute to an infection by backing up the urine.  Ask for help if you have financial, counseling, or nutritional needs during pregnancy. Your caregiver will be able to offer counseling for these needs as well as refer you for other special needs.  Your skin may become oily. If so, wash your face with mild soap, use non-greasy moisturizer and oil or cream based makeup. MEDICINES AND DRUG USE IN PREGNANCY  Take prenatal vitamins as directed. The vitamin should contain 1 milligram of folic acid. Keep all vitamins out of reach of children. Only a couple vitamins or tablets containing iron may be fatal to a baby or young child  when ingested.  Avoid use of all medicines, including herbs, over-the-counter medicines, not prescribed or suggested by your caregiver. Only take over-the-counter or prescription medicines for pain, discomfort, or fever as directed by your caregiver. Do not use aspirin.  Let your caregiver also know about herbs you may be using.  Alcohol is related to a number of birth defects. This includes fetal alcohol syndrome. All alcohol, in any form, should be avoided completely. Smoking will cause low birth rate and premature babies.  Street or illegal drugs are very harmful to the baby. They are absolutely forbidden. A baby born to an addicted mother will be addicted at birth. The baby will go through the same withdrawal an adult does. SEEK MEDICAL CARE IF:  You have any concerns or worries during your pregnancy. It is better to call with your questions if you feel they cannot wait, rather than worry about them. SEEK IMMEDIATE MEDICAL CARE IF:   An unexplained oral temperature above 102 F (38.9 C) develops, or as your caregiver suggests.  You have leaking of fluid from the vagina (birth canal). If leaking membranes are suspected, take your temperature and tell your caregiver of this when you call.  There is vaginal spotting, bleeding, or passing clots. Tell your caregiver of the amount and how many pads are used. Light spotting in pregnancy is common, especially following intercourse.  You develop a bad smelling vaginal discharge with a change in the color from clear to white.  You continue to feel sick to your stomach (nauseated) and have no relief from remedies suggested. You vomit blood or coffee ground-like materials.  You lose more than 2 pounds of weight or gain more than 2 pounds of weight over 1 week, or as suggested by your caregiver.  You notice swelling of your face, hands, feet, or legs.  You get exposed to [place] measles and have never had them.  You are exposed to fifth disease  or chickenpox.  You develop belly (abdominal) pain. Round ligament discomfort is a common non-cancerous (benign) cause of abdominal pain in pregnancy. Your caregiver still must evaluate you.  You develop a bad headache that does not go away.  You develop fever, diarrhea, pain with urination,  or shortness of breath.  You develop visual problems, blurry, or double vision.  You fall or are in a car accident or any kind of trauma.  There is mental or physical violence at home. Document Released: 09/19/2001 Document Revised: 06/19/2012 Document Reviewed: 03/24/2009 [facility] Patient Information 2014 [place], [place].

## 2013-03-26 ENCOUNTER — Encounter: Payer: Self-pay | Admitting: *Deleted

## 2013-03-31 ENCOUNTER — Encounter: Payer: Self-pay | Admitting: *Deleted

## 2013-03-31 DIAGNOSIS — IMO0002 Reserved for concepts with insufficient information to code with codable children: Secondary | ICD-10-CM | POA: Insufficient documentation

## 2013-04-01 ENCOUNTER — Ambulatory Visit (HOSPITAL_COMMUNITY)
Admission: RE | Admit: 2013-04-01 | Discharge: 2013-04-01 | Disposition: A | Discharge status: Home / Self Care | Payer: Medicaid Other | Source: Ambulatory Visit | Attending: Obstetrics & Gynecology | Admitting: Obstetrics & Gynecology

## 2013-04-01 ENCOUNTER — Encounter (HOSPITAL_COMMUNITY): Payer: Self-pay

## 2013-04-01 DIAGNOSIS — Z3480 Encounter for supervision of other normal pregnancy, unspecified trimester: Secondary | ICD-10-CM

## 2013-04-01 DIAGNOSIS — IMO0002 Reserved for concepts with insufficient information to code with codable children: Secondary | ICD-10-CM

## 2013-04-01 DIAGNOSIS — Z3482 Encounter for supervision of other normal pregnancy, second trimester: Secondary | ICD-10-CM

## 2013-04-01 DIAGNOSIS — Z3689 Encounter for other specified antenatal screening: Secondary | ICD-10-CM | POA: Insufficient documentation

## 2013-04-15 ENCOUNTER — Ambulatory Visit (INDEPENDENT_AMBULATORY_CARE_PROVIDER_SITE_OTHER): Payer: Medicaid Other | Admitting: Obstetrics & Gynecology

## 2013-04-15 VITALS — BP 128/76 | Temp 97.0°F | Wt 217.0 lb

## 2013-04-15 DIAGNOSIS — IMO0002 Reserved for concepts with insufficient information to code with codable children: Secondary | ICD-10-CM

## 2013-04-15 DIAGNOSIS — K219 Gastro-esophageal reflux disease without esophagitis: Secondary | ICD-10-CM

## 2013-04-15 DIAGNOSIS — Z3480 Encounter for supervision of other normal pregnancy, unspecified trimester: Secondary | ICD-10-CM

## 2013-04-15 LAB — POCT URINALYSIS DIP (DEVICE)
Hgb urine dipstick: NEGATIVE
Nitrite: NEGATIVE
Protein, ur: NEGATIVE mg/dL
Specific Gravity, Urine: 1.025 (ref 1.005–1.030)
Urobilinogen, UA: 0.2 mg/dL (ref 0.0–1.0)
pH: 7 (ref 5.0–8.0)

## 2013-04-15 MED ORDER — PANTOPRAZOLE SODIUM 40 MG PO TBEC: 40.0000 mg | DELAYED_RELEASE_TABLET | Freq: Every day | ORAL | Status: DC

## 2013-04-15 NOTE — Progress Notes (Signed)
Back pain issues, recommend chiropractic. Lots of heartburn, Rx Protonix

## 2013-04-15 NOTE — Progress Notes (Signed)
Pulse- 88 Edema-feet Pt reports "belly button hurts sometimes" and sharp pain shooting from lower abd to vaginal area

## 2013-04-15 NOTE — Patient Instructions (Signed)

## 2013-05-13 ENCOUNTER — Ambulatory Visit (INDEPENDENT_AMBULATORY_CARE_PROVIDER_SITE_OTHER): Payer: Medicaid Other | Admitting: Obstetrics & Gynecology

## 2013-05-13 VITALS — BP 106/71 | Temp 99.2°F | Wt 222.7 lb

## 2013-05-13 DIAGNOSIS — Z3482 Encounter for supervision of other normal pregnancy, second trimester: Secondary | ICD-10-CM

## 2013-05-13 DIAGNOSIS — IMO0002 Reserved for concepts with insufficient information to code with codable children: Secondary | ICD-10-CM

## 2013-05-13 DIAGNOSIS — Z8709 Personal history of other diseases of the respiratory system: Secondary | ICD-10-CM

## 2013-05-13 LAB — POCT URINALYSIS DIP (DEVICE)
Hgb urine dipstick: NEGATIVE
Nitrite: NEGATIVE
Protein, ur: NEGATIVE mg/dL
Urobilinogen, UA: 0.2 mg/dL (ref 0.0–1.0)
pH: 6.5 (ref 5.0–8.0)

## 2013-05-13 MED ORDER — ALBUTEROL SULFATE HFA 108 (90 BASE) MCG/ACT IN AERS: 2.0000 | INHALATION_SPRAY | Freq: Four times a day (QID) | RESPIRATORY_TRACT | Status: DC | PRN

## 2013-05-13 NOTE — Progress Notes (Signed)
Reflux is much better, dealing with back pain. Has h/o asthma, getting some sx. rx albuterol inhaler prn.

## 2013-05-13 NOTE — Progress Notes (Signed)
Pulse- 90  Edema-feet  Pain/pressure-vaginal

## 2013-05-13 NOTE — Patient Instructions (Addendum)

## 2013-05-20 ENCOUNTER — Encounter: Payer: Self-pay | Admitting: *Deleted

## 2013-05-27 ENCOUNTER — Encounter (HOSPITAL_COMMUNITY): Payer: Self-pay | Admitting: Nurse Practitioner

## 2013-05-27 ENCOUNTER — Emergency Department (HOSPITAL_COMMUNITY)
Admission: EM | Admit: 2013-05-27 | Discharge: 2013-05-27 | Disposition: A | Discharge status: Home / Self Care | Payer: Medicaid Other | Attending: Emergency Medicine | Admitting: Emergency Medicine

## 2013-05-27 DIAGNOSIS — E669 Obesity, unspecified: Secondary | ICD-10-CM | POA: Insufficient documentation

## 2013-05-27 DIAGNOSIS — Y929 Unspecified place or not applicable: Secondary | ICD-10-CM | POA: Insufficient documentation

## 2013-05-27 DIAGNOSIS — T148XXA Other injury of unspecified body region, initial encounter: Secondary | ICD-10-CM | POA: Insufficient documentation

## 2013-05-27 DIAGNOSIS — O9989 Other specified diseases and conditions complicating pregnancy, childbirth and the puerperium: Secondary | ICD-10-CM | POA: Insufficient documentation

## 2013-05-27 DIAGNOSIS — M79609 Pain in unspecified limb: Secondary | ICD-10-CM | POA: Insufficient documentation

## 2013-05-27 DIAGNOSIS — Y939 Activity, unspecified: Secondary | ICD-10-CM | POA: Insufficient documentation

## 2013-05-27 DIAGNOSIS — Z79899 Other long term (current) drug therapy: Secondary | ICD-10-CM | POA: Insufficient documentation

## 2013-05-27 DIAGNOSIS — X58XXXA Exposure to other specified factors, initial encounter: Secondary | ICD-10-CM | POA: Insufficient documentation

## 2013-05-27 MED ORDER — HYDROCODONE-ACETAMINOPHEN 5-325 MG PO TABS: 2.0000 | ORAL_TABLET | ORAL | Status: DC | PRN

## 2013-05-27 MED ORDER — HYDROCODONE-ACETAMINOPHEN 5-325 MG PO TABS
2.0000 | ORAL_TABLET | Freq: Once | ORAL | Status: AC
  Administered 2013-05-27: 2 via ORAL
  Filled 2013-05-27: qty 2

## 2013-05-27 NOTE — ED Notes (Addendum)
Patient says she is [redacted] weeks pregnant and her right leg is hurting so bad she cannot walk on it.  The patient says she cannot put pressure on her right leg because it hurts.  She also mentioned that every time she turns, something pops in her hips.  She cannot walk or do anything because of the pain.  The patient said their have been no complications with her pregnancy.  She says when she lifts her belly on the right side, she take the pressure off the leg and it hurts less.

## 2013-05-27 NOTE — ED Notes (Signed)
Pt c/o R groin/R thigh pain onset Sunday. States the pain is so bad its hard to move her leg or walk. She is [redacted] weeks pregnant with regular prenatal care and no complications.

## 2013-05-27 NOTE — ED Notes (Signed)
Patient is alert and orientedx4.  Patient was explained discharge instructions and they understood them with no questions.  The patient's signigicant other, Leonides Grills, is taking the patient home.

## 2013-05-27 NOTE — ED Provider Notes (Signed)
CSN: [id number]     Arrival date & time 05/27/13  1115 History     First MD Initiated Contact with Patient 05/27/13 1219     Chief Complaint  Patient presents with  . Leg Pain   (Consider location/radiation/quality/duration/timing/severity/associated sxs/prior Treatment) HPI Comments: 32 year old female who is approximately [redacted] weeks pregnant, presents with right inguinal and femoral pain that started in the last several days. She states that she originally felt acute onset of pain, this was during walking, it is reproducible when she stands and walks and is relieved when she lays down the back. It gets severe when she tries to lift her right leg off the bed and when she internally rotates. There is no associated dysuria, hematuria, diarrhea, fever, chills, nausea or vomiting. She has had appropriate fetal movements, she has no abdominal pain whatsoever. According to the patient's pregnancy is totally uncomplicated thus far, her first pregnancy was a totally normal pregnancy that she is G2 P1 at 27 weeks.  Patient is a 32 y.o. female presenting with leg pain. The history is provided by the patient and a relative.  Leg Pain   Past Medical History  Diagnosis Date  . Obesity   . Seasonal allergies    Past Surgical History  Procedure Laterality Date  . Wisdom tooth extraction    . Breast biopsy      age 23   History reviewed. No pertinent family history. History  Substance Use Topics  . Smoking status: Never Smoker   . Smokeless tobacco: Never Used  . Alcohol Use: No   OB History   Grav Para Term Preterm Abortions TAB SAB Ect Mult Living   2 1 1       1      Review of Systems  All other systems reviewed and are negative.    Allergies  Chocolate; Coffee bean extract; Soy allergy; Tea; and Yeast-related products  Home Medications   Current Outpatient Rx  Name  Route  Sig  Dispense  Refill  . albuterol (PROVENTIL HFA;VENTOLIN HFA) 108 (90 BASE) MCG/ACT inhaler    Inhalation   Inhale 2 puffs into the lungs every 6 (six) hours as needed for wheezing.   1 Inhaler   2   . pantoprazole (PROTONIX) 40 MG tablet   Oral   Take 1 tablet (40 mg total) by mouth daily.   30 tablet   4   . Prenatal Multivit-Min-Fe-FA (PRENATAL VITAMINS) 0.8 MG tablet   Oral   Take 1 tablet by mouth daily.   30 tablet   0   . HYDROcodone-acetaminophen (NORCO/VICODIN) 5-325 MG per tablet   Oral   Take 2 tablets by mouth every 4 (four) hours as needed for pain.   10 tablet   0    BP 113/66  Pulse 72  Temp(Src) 98.1 F (36.7 C) (Oral)  Resp 16  Ht 5\' 2"  (1.575 m)  Wt 225 lb (102.059 kg)  BMI 41.14 kg/m2  SpO2 100%  LMP 11/18/2012 Physical Exam  Nursing note and vitals reviewed. Constitutional: She appears well-developed and well-nourished. No distress.  HENT:  Head: Normocephalic and atraumatic.  Mouth/Throat: Oropharynx is clear and moist. No oropharyngeal exudate.  Eyes: Conjunctivae and EOM are normal. Pupils are equal, round, and reactive to light. Right eye exhibits no discharge. Left eye exhibits no discharge. No scleral icterus.  Neck: Normal range of motion. Neck supple. No JVD present. No thyromegaly present.  Cardiovascular: Normal rate, regular rhythm, normal heart sounds and intact  distal pulses.  Exam reveals no gallop and no friction rub.   No murmur heard. Pulmonary/Chest: Effort normal and breath sounds normal. No respiratory distress. She has no wheezes. She has no rales.  Abdominal: Soft. Bowel sounds are normal. She exhibits no distension and no mass. There is no tenderness.  Abdominal girth appropriate for gestational., No tenderness, positive fetal movements, no pain or tenderness in the right lower quadrant.  Musculoskeletal: Normal range of motion. She exhibits tenderness ( Tenderness with straight leg raise, there is no pain when I manually raise her leg without her assistance, pain with internal rotation). She exhibits no edema.  No  tenderness with knee extension or flexion or ankle extension or flexion on the right side.  Lymphadenopathy:    She has no cervical adenopathy.  Neurological: She is alert. Coordination normal.  Skin: Skin is warm and dry. No rash noted. No erythema.  Psychiatric: She has a normal mood and affect. Her behavior is normal.    ED Course   Procedures (including critical care time)  Labs Reviewed - No data to display No results found. 1. Muscle strain     MDM  Overall the patient appears frail well, she has a soft nontender abdomen with appropriate gestational size, she is reducible tenderness with internal rotation of the leg as well as straight leg raise flexion of that same leg. I suspect that she has a muscle strain possibly upper intrapelvic muscles and hip flexors. She does not have pain with adduction. I talked her and counseled her against using NSAIDs at this point in her pregnancy as they would be category D., I have recommended Tylenol and if not she should be using something stronger such as a hydrocodone that she has been warned of the side effects of these medications in pregnancy she wishes to move forward with this and followup with her family doctor. The patient appears stable for discharge, fetal heart tones are 160 beats per minute.  Meds given in ED:  Medications  HYDROcodone-acetaminophen (NORCO/VICODIN) 5-325 MG per tablet 2 tablet (2 tablets Oral Given 05/27/13 1313)    New Prescriptions   HYDROCODONE-ACETAMINOPHEN (NORCO/VICODIN) 5-325 MG PER TABLET    Take 2 tablets by mouth every 4 (four) hours as needed for pain.      Vida Roller, MD 05/27/13 [phone]

## 2013-06-03 ENCOUNTER — Ambulatory Visit (INDEPENDENT_AMBULATORY_CARE_PROVIDER_SITE_OTHER): Payer: Medicaid Other | Admitting: Obstetrics & Gynecology

## 2013-06-03 VITALS — BP 127/78 | Temp 97.2°F | Wt 225.2 lb

## 2013-06-03 DIAGNOSIS — Z3483 Encounter for supervision of other normal pregnancy, third trimester: Secondary | ICD-10-CM

## 2013-06-03 DIAGNOSIS — K219 Gastro-esophageal reflux disease without esophagitis: Secondary | ICD-10-CM

## 2013-06-03 DIAGNOSIS — N390 Urinary tract infection, site not specified: Secondary | ICD-10-CM

## 2013-06-03 DIAGNOSIS — IMO0002 Reserved for concepts with insufficient information to code with codable children: Secondary | ICD-10-CM

## 2013-06-03 DIAGNOSIS — O2343 Unspecified infection of urinary tract in pregnancy, third trimester: Secondary | ICD-10-CM

## 2013-06-03 DIAGNOSIS — O2390 Unspecified genitourinary tract infection in pregnancy, unspecified trimester: Secondary | ICD-10-CM

## 2013-06-03 DIAGNOSIS — Z23 Encounter for immunization: Secondary | ICD-10-CM

## 2013-06-03 LAB — POCT URINALYSIS DIP (DEVICE)
Nitrite: NEGATIVE
Specific Gravity, Urine: 1.025 (ref 1.005–1.030)
Urobilinogen, UA: 1 mg/dL (ref 0.0–1.0)
pH: 7 (ref 5.0–8.0)

## 2013-06-03 LAB — CBC
HCT: 32.6 % — ABNORMAL LOW (ref 36.0–46.0)
MCHC: 33.7 g/dL (ref 30.0–36.0)
MCV: 84 fL (ref 78.0–100.0)
RDW: 13.9 % (ref 11.5–15.5)

## 2013-06-03 MED ORDER — TETANUS-DIPHTH-ACELL PERTUSSIS 5-2.5-18.5 LF-MCG/0.5 IM SUSP
0.5000 mL | Freq: Once | INTRAMUSCULAR | Status: AC
  Administered 2013-06-03: 0.5 mL via INTRAMUSCULAR

## 2013-06-03 NOTE — Patient Instructions (Signed)
Return to clinic for any obstetric concerns or go to [facility] for evaluation  

## 2013-06-03 NOTE — Progress Notes (Signed)
Pulse- 86   Pain/pressure-when baby moves or balls up in a knot  Edema- ankles Pt went [facility] a week ago and was put on bed rest for a pulled muscle.

## 2013-06-03 NOTE — Addendum Note (Signed)
Addended by: Franchot Mimes on: 06/03/2013 12:51 PM   Modules accepted: Orders

## 2013-06-03 NOTE — Progress Notes (Signed)
Third trimester labs today. Counseled about Tdap vaccine, patient to get this today.  Moderate LE on UA today, no urinary symptoms, will send urine culture and follow up results.  No other complaints or concerns.  Fetal movement and labor precautions reviewed.

## 2013-06-04 ENCOUNTER — Encounter: Payer: Self-pay | Admitting: Obstetrics & Gynecology

## 2013-06-04 ENCOUNTER — Telehealth: Payer: Self-pay

## 2013-06-04 LAB — GLUCOSE TOLERANCE, 1 HOUR (50G) W/O FASTING: Glucose, 1 Hour GTT: 162 mg/dL — ABNORMAL HIGH (ref 70–140)

## 2013-06-04 NOTE — Telephone Encounter (Signed)
Called pt and left message to please return the call its concerning results and appt.

## 2013-06-04 NOTE — Telephone Encounter (Signed)
Message copied by Faythe Casa on Wed Jun 04, 2013  3:28 PM ------      Message from: Jaynie Collins A      Created: Wed Jun 04, 2013  8:44 AM       1 hr GTT 162.  Please schedule for 3 hr GTT. Please call to inform patient of results and recommendations.       ------

## 2013-06-05 NOTE — Telephone Encounter (Signed)
Spoke to patient and gave result. She will come in on 06/10/2013 at 0800 for 3 hour GTT. Patient knows to be fasting.

## 2013-06-08 LAB — CULTURE, OB URINE

## 2013-06-10 MED ORDER — NITROFURANTOIN MONOHYD MACRO 100 MG PO CAPS: 100.0000 mg | ORAL_CAPSULE | Freq: Two times a day (BID) | ORAL | Status: DC

## 2013-06-10 NOTE — Progress Notes (Signed)
UA showed >80K of coag negative Staph. Macrobid prescribed.

## 2013-06-10 NOTE — Addendum Note (Signed)
Addended by: Jaynie Collins A on: 06/10/2013 08:46 AM   Modules accepted: Orders

## 2013-06-11 ENCOUNTER — Other Ambulatory Visit: Payer: Medicaid Other

## 2013-06-11 DIAGNOSIS — O99810 Abnormal glucose complicating pregnancy: Secondary | ICD-10-CM

## 2013-06-12 ENCOUNTER — Encounter: Payer: Self-pay | Admitting: Family Medicine

## 2013-06-17 ENCOUNTER — Encounter: Payer: Self-pay | Admitting: Family Medicine

## 2013-06-17 ENCOUNTER — Ambulatory Visit (INDEPENDENT_AMBULATORY_CARE_PROVIDER_SITE_OTHER): Payer: Medicaid Other | Admitting: Family Medicine

## 2013-06-17 VITALS — BP 121/79 | Temp 97.1°F | Wt 224.4 lb

## 2013-06-17 DIAGNOSIS — Z3480 Encounter for supervision of other normal pregnancy, unspecified trimester: Secondary | ICD-10-CM

## 2013-06-17 DIAGNOSIS — IMO0002 Reserved for concepts with insufficient information to code with codable children: Secondary | ICD-10-CM

## 2013-06-17 LAB — POCT URINALYSIS DIP (DEVICE)
Glucose, UA: NEGATIVE mg/dL
Hgb urine dipstick: NEGATIVE
Specific Gravity, Urine: >=1.03 (ref 1.005–1.030)
Urobilinogen, UA: 1 mg/dL (ref 0.0–1.0)
pH: 6 (ref 5.0–8.0)

## 2013-06-17 NOTE — Patient Instructions (Signed)

## 2013-06-17 NOTE — Progress Notes (Signed)
S: 32 yo G2P1001 @ [redacted]w[redacted]d for routine OBV. - no ctx, vb, lof. +FM - having increased urinary frequency- no fevers, chills, nausea, vomiting, back pain - rx of macrobid given but she wasn't taking due to a reaction on the rx with her protonix so wasn't sure she could.   O:see flowsheet  A/P:  - doing well. No concerns - reassurance given about taking macrobid. Advised can take with zantac instead of protonix for the next week if she would like  - 3 hour glucola was normal.  - will f/u in 2 weeks - labor precautions and fm discussed

## 2013-06-17 NOTE — Progress Notes (Signed)
Pulse: 92

## 2013-06-25 ENCOUNTER — Encounter: Payer: Self-pay | Admitting: *Deleted

## 2013-07-01 ENCOUNTER — Encounter: Payer: Self-pay | Admitting: Family Medicine

## 2013-07-01 ENCOUNTER — Ambulatory Visit (INDEPENDENT_AMBULATORY_CARE_PROVIDER_SITE_OTHER): Payer: Medicaid Other | Admitting: Family Medicine

## 2013-07-01 VITALS — BP 113/75 | Temp 98.2°F | Wt 228.3 lb

## 2013-07-01 DIAGNOSIS — IMO0002 Reserved for concepts with insufficient information to code with codable children: Secondary | ICD-10-CM

## 2013-07-01 DIAGNOSIS — Z3483 Encounter for supervision of other normal pregnancy, third trimester: Secondary | ICD-10-CM

## 2013-07-01 LAB — POCT URINALYSIS DIP (DEVICE)
Bilirubin Urine: NEGATIVE
Hgb urine dipstick: NEGATIVE
Ketones, ur: NEGATIVE mg/dL
pH: 8.5 — ABNORMAL HIGH (ref 5.0–8.0)

## 2013-07-01 NOTE — Progress Notes (Signed)
31yo G2P1001 @ [redacted]w[redacted]d here for ROBV - no ctx, vb, lof. +FM - finished macrobid for UTI- no symptoms - having some hip pain when gets up in the middle of the night to go to the bathroom. Feels like her joints are loose and groin is tight.   O: see flowsheet   A/p - pain at night consistent with round ligament pain and likely relaxin release causing some of the loose feeling - ua without evidence of UTI - ptl precuations discussed - no other concerns today - f/u in 2 weeks

## 2013-07-01 NOTE — Patient Instructions (Signed)
Pregnancy - Third Trimester The third trimester of pregnancy (the last 3 months) is a period of the most rapid growth for you and your baby. The baby approaches a length of 20 inches and a weight of 6 to 10 pounds. The baby is adding on fat and getting ready for life outside your body. While inside, babies have periods of sleeping and waking, sucking thumbs, and hiccuping. You can often feel small contractions of the uterus. This is false labor. It is also called Braxton-Hicks contractions. This is like a practice for labor. The usual problems in this stage of pregnancy include more difficulty breathing, swelling of the hands and feet from water retention, and having to urinate more often because of the uterus and baby pressing on your bladder.  PRENATAL EXAMS  Blood work may continue to be done during prenatal exams. These tests are done to check on your health and the probable health of your baby. Blood work is used to follow your blood levels (hemoglobin). Anemia (low hemoglobin) is common during pregnancy. Iron and vitamins are given to help prevent this. You may also continue to be checked for diabetes. Some of the past blood tests may be done again.  The size of the uterus is measured during each visit. This makes sure your baby is growing properly according to your pregnancy dates.  Your blood pressure is checked every prenatal visit. This is to make sure you are not getting toxemia.  Your urine is checked every prenatal visit for infection, diabetes, and protein.  Your weight is checked at each visit. This is done to make sure gains are happening at the suggested rate and that you and your baby are growing normally.  Sometimes, an ultrasound is performed to confirm the position and the proper growth and development of the baby. This is a test done that bounces harmless sound waves off the baby so your caregiver can more accurately determine a due date.  Discuss the type of pain medicine and  anesthesia you will have during your labor and delivery.  Discuss the possibility and anesthesia if a cesarean section might be necessary.  Inform your caregiver if there is any mental or physical violence at home. Sometimes, a specialized non-stress test, contraction stress test, and biophysical profile are done to make sure the baby is not having a problem. Checking the amniotic fluid surrounding the baby is called an amniocentesis. The amniotic fluid is removed by sticking a needle into the belly (abdomen). This is sometimes done near the end of pregnancy if an early delivery is required. In this case, it is done to help make sure the baby's lungs are mature enough for the baby to live outside of the womb. If the lungs are not mature and it is unsafe to deliver the baby, an injection of cortisone medicine is given to the mother 1 to 2 days before the delivery. This helps the baby's lungs mature and makes it safer to deliver the baby. CHANGES OCCURING IN THE THIRD TRIMESTER OF PREGNANCY Your body goes through many changes during pregnancy. They vary from person to person. Talk to your caregiver about changes you notice and are concerned about.  During the last trimester, you have probably had an increase in your appetite. It is normal to have cravings for certain foods. This varies from person to person and pregnancy to pregnancy.  You may begin to get stretch marks on your hips, abdomen, and breasts. These are normal changes in the body   during pregnancy. There are no exercises or medicines to take which prevent this change.  Constipation may be treated with a stool softener or adding bulk to your diet. Drinking lots of fluids, fiber in vegetables, fruits, and whole grains are helpful.  Exercising is also helpful. If you have been very active up until your pregnancy, most of these activities can be continued during your pregnancy. If you have been less active, it is helpful to start an exercise  program such as walking. Consult your caregiver before starting exercise programs.  Avoid all smoking, alcohol, non-prescribed drugs, herbs and "street drugs" during your pregnancy. These chemicals affect the formation and growth of the baby. Avoid chemicals throughout the pregnancy to ensure the delivery of a healthy infant.  Backache, varicose veins, and hemorrhoids may develop or get worse.  You will tire more easily in the third trimester, which is normal.  The baby's movements may be stronger and more often.  You may become short of breath easily.  Your belly button may stick out.  A yellow discharge may leak from your breasts called colostrum.  You may have a bloody mucus discharge. This usually occurs a few days to a week before labor begins. HOME CARE INSTRUCTIONS   Keep your caregiver's appointments. Follow your caregiver's instructions regarding medicine use, exercise, and diet.  During pregnancy, you are providing food for you and your baby. Continue to eat regular, well-balanced meals. Choose foods such as meat, fish, milk and other low fat dairy products, vegetables, fruits, and whole-grain breads and cereals. Your caregiver will tell you of the ideal weight gain.  A physical sexual relationship may be continued throughout pregnancy if there are no other problems such as early (premature) leaking of amniotic fluid from the membranes, vaginal bleeding, or belly (abdominal) pain.  Exercise regularly if there are no restrictions. Check with your caregiver if you are unsure of the safety of your exercises. Greater weight gain will occur in the last 2 trimesters of pregnancy. Exercising helps:  Control your weight.  Get you in shape for labor and delivery.  You lose weight after you deliver.  Rest a lot with legs elevated, or as needed for leg cramps or low back pain.  Wear a good support or jogging bra for breast tenderness during pregnancy. This may help if worn during  sleep. Pads or tissues may be used in the bra if you are leaking colostrum.  Do not use hot tubs, steam rooms, or saunas.  Wear your seat belt when driving. This protects you and your baby if you are in an accident.  Avoid raw meat, cat litter boxes and soil used by cats. These carry germs that can cause birth defects in the baby.  It is easier to leak urine during pregnancy. Tightening up and strengthening the pelvic muscles will help with this problem. You can practice stopping your urination while you are going to the bathroom. These are the same muscles you need to strengthen. It is also the muscles you would use if you were trying to stop from passing gas. You can practice tightening these muscles up 10 times a set and repeating this about 3 times per day. Once you know what muscles to tighten up, do not perform these exercises during urination. It is more likely to cause an infection by backing up the urine.  Ask for help if you have financial, counseling, or nutritional needs during pregnancy. Your caregiver will be able to offer counseling for these   needs as well as refer you for other special needs.  Make a list of emergency phone numbers and have them available.  Plan on getting help from family or friends when you go home from the hospital.  Make a trial run to the hospital.  Take prenatal classes with the father to understand, practice, and ask questions about the labor and delivery.  Prepare the baby's room or nursery.  Do not travel out of the city unless it is absolutely necessary and with the advice of your caregiver.  Wear only low or no heal shoes to have better balance and prevent falling. MEDICINES AND DRUG USE IN PREGNANCY  Take prenatal vitamins as directed. The vitamin should contain 1 milligram of folic acid. Keep all vitamins out of reach of children. Only a couple vitamins or tablets containing iron may be fatal to a baby or young child when ingested.  Avoid use  of all medicines, including herbs, over-the-counter medicines, not prescribed or suggested by your caregiver. Only take over-the-counter or prescription medicines for pain, discomfort, or fever as directed by your caregiver. Do not use aspirin, ibuprofen or naproxen unless approved by your caregiver.  Let your caregiver also know about herbs you may be using.  Alcohol is related to a number of birth defects. This includes fetal alcohol syndrome. All alcohol, in any form, should be avoided completely. Smoking will cause low birth rate and premature babies.  Illegal drugs are very harmful to the baby. They are absolutely forbidden. A baby born to an addicted mother will be addicted at birth. The baby will go through the same withdrawal an adult does. SEEK MEDICAL CARE IF: You have any concerns or worries during your pregnancy. It is better to call with your questions if you feel they cannot wait, rather than worry about them. SEEK IMMEDIATE MEDICAL CARE IF:   An unexplained oral temperature above 102 F (38.9 C) develops, or as your caregiver suggests.  You have leaking of fluid from the vagina. If leaking membranes are suspected, take your temperature and tell your caregiver of this when you call.  There is vaginal spotting, bleeding or passing clots. Tell your caregiver of the amount and how many pads are used.  You develop a bad smelling vaginal discharge with a change in the color from clear to white.  You develop vomiting that lasts more than 24 hours.  You develop chills or fever.  You develop shortness of breath.  You develop burning on urination.  You loose more than 2 pounds of weight or gain more than 2 pounds of weight or as suggested by your caregiver.  You notice sudden swelling of your face, hands, and feet or legs.  You develop belly (abdominal) pain. Round ligament discomfort is a common non-cancerous (benign) cause of abdominal pain in pregnancy. Your caregiver still  must evaluate you.  You develop a severe headache that does not go away.  You develop visual problems, blurred or double vision.  If you have not felt your baby move for more than 1 hour. If you think the baby is not moving as much as usual, eat something with sugar in it and lie down on your left side for an hour. The baby should move at least 4 to 5 times per hour. Call right away if your baby moves less than that.  You fall, are in a car accident, or any kind of trauma.  There is mental or physical violence at home. Document Released: 09/19/2001   Document Revised: 06/19/2012 Document Reviewed: 03/24/2009 [employer] Patient Information 2014 [employer]. Round Ligament Pain The round ligament is made up of muscle and fibrous tissue. It is attached to the uterus near the fallopian tube. The round ligament is located on both sides of the uterus and helps support the position of the uterus. It usually begins in the second trimester of pregnancy when the uterus comes out of the pelvis. The pain can come and go until the baby is delivered. Round ligament pain is not a serious problem and does not cause harm to the baby. CAUSE During pregnancy the uterus grows the most from the second trimester to delivery. As it grows, it stretches and slightly twists the round ligaments. When the uterus leans from one side to the other, the round ligament on the opposite side pulls and stretches. This can cause pain. SYMPTOMS  Pain can occur on one side or both sides. The pain is usually a short, sharp, and pinching-like. Sometimes it can be a dull, lingering and aching pain. The pain is located in the lower side of the abdomen or in the groin. The pain is internal and usually starts deep in the groin and moves up to the outside of the hip area. Pain can occur with:  Sudden change in position like getting out of bed or a chair.  Rolling over in bed.  Coughing or sneezing.  Walking too much.  Any type of  physical activity. DIAGNOSIS  Your caregiver will make sure there are no serious problems causing the pain. When nothing serious is found, the symptoms usually indicate that the pain is from the round ligament. TREATMENT   Sit down and relax when the pain starts.  Flex your knees up to your belly.  Lay on your side with a pillow under your belly (abdomen) and another one between your legs.  Sit in a hot bath for 15 to 20 minutes or until the pain goes away. HOME CARE INSTRUCTIONS   Only take over-the-counter or prescriptions medicines for pain, discomfort or fever as directed by your caregiver.  Sit and stand slowly.  Avoid long walks if it causes pain.  Stop or lessen your physical activities if it causes pain. SEEK MEDICAL CARE IF:   The pain does not go away with any of your treatment.  You need stronger medication for the pain.  You develop back pain that you did not have before with the side pain. SEEK IMMEDIATE MEDICAL CARE IF:   You develop a temperature of 102 F (38.9 C) or higher.  You develop uterine contractions.  You develop vaginal bleeding.  You develop nausea, vomiting or diarrhea.  You develop chills.  You have pain when you urinate. Document Released: 07/04/2008 Document Revised: 12/18/2011 Document Reviewed: 07/04/2008 [employer] Patient Information 2014 [employer].  

## 2013-07-01 NOTE — Progress Notes (Signed)
Pulse- 88 Patient reports occasional lower abdominal/pelvic pressure; also reports lower back pain and occasional right hand numbness

## 2013-07-16 ENCOUNTER — Ambulatory Visit (INDEPENDENT_AMBULATORY_CARE_PROVIDER_SITE_OTHER): Payer: Medicaid Other | Admitting: Obstetrics and Gynecology

## 2013-07-16 ENCOUNTER — Encounter: Payer: Self-pay | Admitting: Obstetrics and Gynecology

## 2013-07-16 VITALS — BP 121/67 | Temp 98.6°F | Wt 227.9 lb

## 2013-07-16 DIAGNOSIS — O368390 Maternal care for abnormalities of the fetal heart rate or rhythm, unspecified trimester, not applicable or unspecified: Secondary | ICD-10-CM

## 2013-07-16 DIAGNOSIS — Z3483 Encounter for supervision of other normal pregnancy, third trimester: Secondary | ICD-10-CM

## 2013-07-16 NOTE — Progress Notes (Signed)
P=91, 

## 2013-07-16 NOTE — Progress Notes (Signed)
Fetal tachy x > 1 min> Diane to establish baseline normalcy with EFM.  S>D today, pt obese. Had normal 3 hr. Plan [name] eval if persists. Recommend avoid simple sugars.

## 2013-07-16 NOTE — Addendum Note (Signed)
Addended by: Jill Side on: 07/16/2013 12:41 PM   Modules accepted: Orders

## 2013-07-16 NOTE — Patient Instructions (Signed)

## 2013-07-17 LAB — POCT URINALYSIS DIP (DEVICE)
Ketones, ur: NEGATIVE mg/dL
Protein, ur: NEGATIVE mg/dL
Specific Gravity, Urine: 1.02 (ref 1.005–1.030)
pH: 7.5 (ref 5.0–8.0)

## 2013-07-18 NOTE — Progress Notes (Signed)
FHR 150 baseline

## 2013-07-30 ENCOUNTER — Encounter: Payer: Self-pay | Admitting: Family Medicine

## 2013-07-30 ENCOUNTER — Ambulatory Visit (INDEPENDENT_AMBULATORY_CARE_PROVIDER_SITE_OTHER): Payer: Medicaid Other | Admitting: Family Medicine

## 2013-07-30 VITALS — BP 112/76 | Temp 97.8°F | Wt 233.0 lb

## 2013-07-30 DIAGNOSIS — IMO0002 Reserved for concepts with insufficient information to code with codable children: Secondary | ICD-10-CM

## 2013-07-30 DIAGNOSIS — Z3493 Encounter for supervision of normal pregnancy, unspecified, third trimester: Secondary | ICD-10-CM

## 2013-07-30 LAB — POCT URINALYSIS DIP (DEVICE)
Hgb urine dipstick: NEGATIVE
Protein, ur: NEGATIVE mg/dL
Specific Gravity, Urine: 1.02 (ref 1.005–1.030)
Urobilinogen, UA: 0.2 mg/dL (ref 0.0–1.0)
pH: 7 (ref 5.0–8.0)

## 2013-07-30 LAB — OB RESULTS CONSOLE GC/CHLAMYDIA: Chlamydia: NEGATIVE

## 2013-07-30 NOTE — Addendum Note (Signed)
Addended by: Franchot Mimes on: 07/30/2013 10:17 AM   Modules accepted: Orders

## 2013-07-30 NOTE — Progress Notes (Signed)
Pulse 84  Edema trace in hands and feet.

## 2013-07-30 NOTE — Progress Notes (Signed)
  Subjective:    Lynn Wilcox is a 32 y.o. female being seen today for her obstetrical visit. She is at [redacted]w[redacted]d gestation. Patient reports no bleeding, no cramping, no leaking and occasional contractions. Fetal movement: normal.  Menstrual History: OB History   Grav Para Term Preterm Abortions TAB SAB Ect Mult Living   2 1 1       1        Patient's last menstrual period was 11/18/2012.    The following portions of the patient's history were reviewed and updated as appropriate: allergies, current medications, past family history, past medical history, past social history, past surgical history and problem list.  Review of Systems Pertinent items are noted in HPI.   Objective:    BP 112/76  Temp(Src) 97.8 F (36.6 C)  Wt 105.688 kg (233 lb)  BMI 42.61 kg/m2  LMP 11/18/2012 FHT:  140 BPM  Uterine Size: 38 cm and size equals dates  Presentation:      Assessment:   Lynn Wilcox is a 32 y.o. G2P1001 at [redacted]w[redacted]d here for ROB visit.  Discussed with Patient:  -Plans to breast feed.  All questions answered. -Continue prenatal vitamins. -Reviewed fetal kick counts Pt to perform daily at a time when the baby is active, lie laterally with both hands on belly in quiet room and count all movements (hiccups, shoulder rolls, obvious kicks, etc); pt is to report to clinic L&D for less than 10 movements felt in a one hour time period-pt told as soon as she counts 10 movements the count is complete.  - Routine precautions discussed (depression, infection s/s).   Patient provided with all pertinent phone numbers for emergencies. - RTC for any VB, regular, painful cramps/ctxs occurring at a rate of >2/10 min, fever (100.5 or higher), n/v/d, any pain that is unresolving or worsening, LOF, decreased fetal movement, CP, SOB, edema - RTC in 2 weeks for next appt. - Did GBS swabs today and will f/u results and call if abnormal.  Contact#:  Pt has no drug allergies, so will get PCN if GBS+ OR will get  sensitivities on GBS swab as pt is PCN-allergic.  Problems: Patient Active Problem List   Diagnosis Date Noted  . History of asthma 05/13/2013  . GERD (gastroesophageal reflux disease) 04/15/2013  . Edema or excessive weight gain, antepartum 03/31/2013  . Supervision of other normal pregnancy 02/18/2013    To Do: 1.  2. Labor bag packed  [ ]  Vaccines: Flu:  Tdap: recd [ ]  BCM:  [ ]  Readiness: baby has a place to sleep, car seat, other baby necessities.  Edu: [ ]  PTL precautions; [ ]  BF class; [ ]  childbirth class; [ ]   BF counseling;

## 2013-07-31 LAB — GC/CHLAMYDIA PROBE AMP: CT Probe RNA: NEGATIVE

## 2013-08-04 ENCOUNTER — Encounter: Payer: Self-pay | Admitting: Family Medicine

## 2013-08-06 ENCOUNTER — Ambulatory Visit (INDEPENDENT_AMBULATORY_CARE_PROVIDER_SITE_OTHER): Payer: Medicaid Other | Admitting: Family

## 2013-08-06 VITALS — BP 119/76 | Temp 97.0°F | Wt 234.3 lb

## 2013-08-06 DIAGNOSIS — Z3483 Encounter for supervision of other normal pregnancy, third trimester: Secondary | ICD-10-CM

## 2013-08-06 DIAGNOSIS — IMO0002 Reserved for concepts with insufficient information to code with codable children: Secondary | ICD-10-CM

## 2013-08-06 LAB — POCT URINALYSIS DIP (DEVICE)
Nitrite: NEGATIVE
Protein, ur: 30 mg/dL — AB
Specific Gravity, Urine: 1.025 (ref 1.005–1.030)
pH: 7 (ref 5.0–8.0)

## 2013-08-06 MED ORDER — CYCLOBENZAPRINE HCL 10 MG PO TABS: 10.0000 mg | ORAL_TABLET | Freq: Every evening | ORAL | Status: DC | PRN

## 2013-08-06 NOTE — Progress Notes (Signed)
Pulse- 85 Patient reports pain with braxton hicks contractions

## 2013-08-06 NOTE — Progress Notes (Signed)
Reports leg slipping yesterday in front of her, stretched inner leg, did not fall and strike abdomen.  Continued pain in left groin area.  No bleeding or leaking of fluid.  Reviewed signs of labor and GBS results. +headaches > tylenol or flexeril.

## 2013-08-13 ENCOUNTER — Ambulatory Visit (INDEPENDENT_AMBULATORY_CARE_PROVIDER_SITE_OTHER): Payer: Medicaid Other | Admitting: Advanced Practice Midwife

## 2013-08-13 VITALS — BP 105/74 | Temp 98.0°F | Wt 235.7 lb

## 2013-08-13 DIAGNOSIS — Z3483 Encounter for supervision of other normal pregnancy, third trimester: Secondary | ICD-10-CM

## 2013-08-13 DIAGNOSIS — IMO0002 Reserved for concepts with insufficient information to code with codable children: Secondary | ICD-10-CM

## 2013-08-13 LAB — POCT URINALYSIS DIP (DEVICE)
Bilirubin Urine: NEGATIVE
Hgb urine dipstick: NEGATIVE
Ketones, ur: NEGATIVE mg/dL
Protein, ur: NEGATIVE mg/dL
Urobilinogen, UA: 0.2 mg/dL (ref 0.0–1.0)
pH: 7 (ref 5.0–8.0)

## 2013-08-13 NOTE — Progress Notes (Signed)
Doing well. Reviewed signs of labor. Declines exam today

## 2013-08-13 NOTE — Progress Notes (Signed)
P=98 Pt c/o of pressure and irregular contractions, desires cervical exam today.

## 2013-08-13 NOTE — Patient Instructions (Signed)

## 2013-08-20 ENCOUNTER — Ambulatory Visit (INDEPENDENT_AMBULATORY_CARE_PROVIDER_SITE_OTHER): Payer: Medicaid Other | Admitting: Advanced Practice Midwife

## 2013-08-20 VITALS — BP 121/83 | Temp 98.5°F | Wt 237.2 lb

## 2013-08-20 DIAGNOSIS — K219 Gastro-esophageal reflux disease without esophagitis: Secondary | ICD-10-CM

## 2013-08-20 DIAGNOSIS — IMO0002 Reserved for concepts with insufficient information to code with codable children: Secondary | ICD-10-CM

## 2013-08-20 LAB — POCT URINALYSIS DIP (DEVICE)
Glucose, UA: NEGATIVE mg/dL
Hgb urine dipstick: NEGATIVE
Ketones, ur: NEGATIVE mg/dL
Protein, ur: 30 mg/dL — AB
Specific Gravity, Urine: 1.025 (ref 1.005–1.030)
Urobilinogen, UA: 1 mg/dL (ref 0.0–1.0)

## 2013-08-20 NOTE — Progress Notes (Signed)
Pulse- 79  Edema-feet  Pain/pressure- lower abd, stomach Pt reports feeling leaking fluid

## 2013-08-20 NOTE — Progress Notes (Signed)
More contractions this week. Has moisture >> no pooling, no ferning.

## 2013-08-20 NOTE — Patient Instructions (Signed)

## 2013-08-23 ENCOUNTER — Inpatient Hospital Stay (HOSPITAL_COMMUNITY)
Admission: AD | Admit: 2013-08-23 | Discharge: 2013-08-25 | DRG: 775 | Disposition: A | Discharge status: Home / Self Care | Payer: Medicaid Other | Source: Ambulatory Visit | Attending: Obstetrics & Gynecology | Admitting: Obstetrics & Gynecology

## 2013-08-23 ENCOUNTER — Encounter (HOSPITAL_COMMUNITY): Payer: Medicaid Other | Admitting: Anesthesiology

## 2013-08-23 ENCOUNTER — Encounter (HOSPITAL_COMMUNITY): Payer: Self-pay | Admitting: *Deleted

## 2013-08-23 ENCOUNTER — Inpatient Hospital Stay (HOSPITAL_COMMUNITY): Payer: Medicaid Other | Admitting: Anesthesiology

## 2013-08-23 DIAGNOSIS — Z3483 Encounter for supervision of other normal pregnancy, third trimester: Secondary | ICD-10-CM

## 2013-08-23 DIAGNOSIS — IMO0002 Reserved for concepts with insufficient information to code with codable children: Secondary | ICD-10-CM

## 2013-08-23 HISTORY — DX: Unspecified asthma, uncomplicated: J45.909

## 2013-08-23 LAB — COMPREHENSIVE METABOLIC PANEL
AST: 15 U/L (ref 0–37)
Albumin: 2.6 g/dL — ABNORMAL LOW (ref 3.5–5.2)
Alkaline Phosphatase: 135 U/L — ABNORMAL HIGH (ref 39–117)
CO2: 21 mEq/L (ref 19–32)
Calcium: 9.5 mg/dL (ref 8.4–10.5)
Chloride: 101 mEq/L (ref 96–112)
Creatinine, Ser: 0.58 mg/dL (ref 0.50–1.10)
GFR calc non Af Amer: >90 mL/min (ref >90)
Glucose, Bld: 108 mg/dL — ABNORMAL HIGH (ref 70–99)
Potassium: 3.6 mEq/L (ref 3.5–5.1)
Total Bilirubin: 0.2 mg/dL — ABNORMAL LOW (ref 0.3–1.2)

## 2013-08-23 LAB — CBC
HCT: 34.3 % — ABNORMAL LOW (ref 36.0–46.0)
Hemoglobin: 11.8 g/dL — ABNORMAL LOW (ref 12.0–15.0)
MCH: 27.2 pg (ref 26.0–34.0)
MCV: 79 fL (ref 78.0–100.0)
RBC: 4.34 MIL/uL (ref 3.87–5.11)
WBC: 11.1 10*3/uL — ABNORMAL HIGH (ref 4.0–10.5)

## 2013-08-23 LAB — ABO/RH: ABO/RH(D): B POS

## 2013-08-23 LAB — TYPE AND SCREEN
ABO/RH(D): B POS
Antibody Screen: NEGATIVE

## 2013-08-23 MED ORDER — SODIUM CHLORIDE 0.9 % IJ SOLN
3.0000 mL | Freq: Two times a day (BID) | INTRAMUSCULAR | Status: DC
Start: 2013-08-23 — End: 2013-08-25

## 2013-08-23 MED ORDER — FLEET ENEMA 7-19 GM/118ML RE ENEM: 1.0000 | ENEMA | Freq: Every day | RECTAL | Status: DC | PRN

## 2013-08-23 MED ORDER — SODIUM CHLORIDE 0.9 % IV SOLN: 250.0000 mL | INTRAVENOUS | Status: DC | PRN

## 2013-08-23 MED ORDER — OXYCODONE-ACETAMINOPHEN 5-325 MG PO TABS: 1.0000 | ORAL_TABLET | ORAL | Status: DC | PRN

## 2013-08-23 MED ORDER — LIDOCAINE HCL (PF) 1 % IJ SOLN
30.0000 mL | INTRAMUSCULAR | Status: DC | PRN
  Filled 2013-08-23: qty 30

## 2013-08-23 MED ORDER — EPHEDRINE 5 MG/ML INJ
10.0000 mg | INTRAVENOUS | Status: DC | PRN
  Filled 2013-08-23: qty 2

## 2013-08-23 MED ORDER — DIPHENHYDRAMINE HCL 50 MG/ML IJ SOLN: 12.5000 mg | INTRAMUSCULAR | Status: DC | PRN

## 2013-08-23 MED ORDER — WITCH HAZEL-GLYCERIN EX PADS: 1.0000 "application " | MEDICATED_PAD | CUTANEOUS | Status: DC | PRN

## 2013-08-23 MED ORDER — FENTANYL CITRATE 0.05 MG/ML IJ SOLN: 100.0000 ug | INTRAMUSCULAR | Status: DC | PRN

## 2013-08-23 MED ORDER — PHENYLEPHRINE 40 MCG/ML (10ML) SYRINGE FOR IV PUSH (FOR BLOOD PRESSURE SUPPORT)
80.0000 ug | PREFILLED_SYRINGE | INTRAVENOUS | Status: DC | PRN
  Filled 2013-08-23: qty 10
  Filled 2013-08-23: qty 2

## 2013-08-23 MED ORDER — EPHEDRINE 5 MG/ML INJ
10.0000 mg | INTRAVENOUS | Status: DC | PRN
  Filled 2013-08-23: qty 2
  Filled 2013-08-23: qty 4

## 2013-08-23 MED ORDER — ONDANSETRON HCL 4 MG/2ML IJ SOLN: 4.0000 mg | INTRAMUSCULAR | Status: DC | PRN

## 2013-08-23 MED ORDER — PRENATAL MULTIVITAMIN CH
1.0000 | ORAL_TABLET | Freq: Every day | ORAL | Status: DC
  Administered 2013-08-23 – 2013-08-24 (×2): 1 via ORAL
  Filled 2013-08-23 (×2): qty 1

## 2013-08-23 MED ORDER — MEASLES, MUMPS & RUBELLA VAC ~~LOC~~ INJ
0.5000 mL | INJECTION | Freq: Once | SUBCUTANEOUS | Status: DC
  Filled 2013-08-23: qty 0.5

## 2013-08-23 MED ORDER — TETANUS-DIPHTH-ACELL PERTUSSIS 5-2.5-18.5 LF-MCG/0.5 IM SUSP: 0.5000 mL | Freq: Once | INTRAMUSCULAR | Status: DC

## 2013-08-23 MED ORDER — ACETAMINOPHEN 325 MG PO TABS: 650.0000 mg | ORAL_TABLET | ORAL | Status: DC | PRN

## 2013-08-23 MED ORDER — LANOLIN HYDROUS EX OINT: TOPICAL_OINTMENT | CUTANEOUS | Status: DC | PRN

## 2013-08-23 MED ORDER — BENZOCAINE-MENTHOL 20-0.5 % EX AERO
1.0000 "application " | INHALATION_SPRAY | CUTANEOUS | Status: DC | PRN
  Administered 2013-08-23: 1 via TOPICAL
  Filled 2013-08-23: qty 56

## 2013-08-23 MED ORDER — OXYTOCIN 40 UNITS IN LACTATED RINGERS INFUSION - SIMPLE MED: 62.5000 mL/h | INTRAVENOUS | Status: DC | PRN

## 2013-08-23 MED ORDER — OXYTOCIN 40 UNITS IN LACTATED RINGERS INFUSION - SIMPLE MED
62.5000 mL/h | INTRAVENOUS | Status: DC
  Filled 2013-08-23: qty 1000

## 2013-08-23 MED ORDER — FENTANYL 2.5 MCG/ML BUPIVACAINE 1/10 % EPIDURAL INFUSION (WH - ANES)
INTRAMUSCULAR | Status: DC | PRN
  Administered 2013-08-23: 14 mL/h via EPIDURAL

## 2013-08-23 MED ORDER — DIBUCAINE 1 % RE OINT: 1.0000 "application " | TOPICAL_OINTMENT | RECTAL | Status: DC | PRN

## 2013-08-23 MED ORDER — IBUPROFEN 600 MG PO TABS
600.0000 mg | ORAL_TABLET | Freq: Four times a day (QID) | ORAL | Status: DC
  Administered 2013-08-23 – 2013-08-25 (×8): 600 mg via ORAL
  Filled 2013-08-23 (×8): qty 1

## 2013-08-23 MED ORDER — LACTATED RINGERS IV SOLN: 500.0000 mL | INTRAVENOUS | Status: DC | PRN

## 2013-08-23 MED ORDER — OXYTOCIN BOLUS FROM INFUSION
500.0000 mL | INTRAVENOUS | Status: DC
  Administered 2013-08-23: 500 mL via INTRAVENOUS

## 2013-08-23 MED ORDER — LABETALOL HCL 5 MG/ML IV SOLN
20.0000 mg | INTRAVENOUS | Status: DC | PRN
  Administered 2013-08-23: 20 mg via INTRAVENOUS
  Filled 2013-08-23: qty 16
  Filled 2013-08-23 (×2): qty 4

## 2013-08-23 MED ORDER — ONDANSETRON HCL 4 MG PO TABS: 4.0000 mg | ORAL_TABLET | ORAL | Status: DC | PRN

## 2013-08-23 MED ORDER — LACTATED RINGERS IV SOLN
INTRAVENOUS | Status: DC
  Administered 2013-08-23: 07:00:00 via INTRAVENOUS

## 2013-08-23 MED ORDER — IBUPROFEN 600 MG PO TABS
600.0000 mg | ORAL_TABLET | Freq: Four times a day (QID) | ORAL | Status: DC | PRN
  Administered 2013-08-23: 600 mg via ORAL
  Filled 2013-08-23: qty 1

## 2013-08-23 MED ORDER — LIDOCAINE HCL (PF) 1 % IJ SOLN
INTRAMUSCULAR | Status: DC | PRN
  Administered 2013-08-23 (×3): 5 mL

## 2013-08-23 MED ORDER — DIPHENHYDRAMINE HCL 25 MG PO CAPS: 25.0000 mg | ORAL_CAPSULE | Freq: Four times a day (QID) | ORAL | Status: DC | PRN

## 2013-08-23 MED ORDER — ZOLPIDEM TARTRATE 5 MG PO TABS: 5.0000 mg | ORAL_TABLET | Freq: Every evening | ORAL | Status: DC | PRN

## 2013-08-23 MED ORDER — SIMETHICONE 80 MG PO CHEW: 80.0000 mg | CHEWABLE_TABLET | ORAL | Status: DC | PRN

## 2013-08-23 MED ORDER — CITRIC ACID-SODIUM CITRATE 334-500 MG/5ML PO SOLN: 30.0000 mL | ORAL | Status: DC | PRN

## 2013-08-23 MED ORDER — FENTANYL 2.5 MCG/ML BUPIVACAINE 1/10 % EPIDURAL INFUSION (WH - ANES)
14.0000 mL/h | INTRAMUSCULAR | Status: DC | PRN
  Filled 2013-08-23 (×2): qty 125

## 2013-08-23 MED ORDER — BISACODYL 10 MG RE SUPP: 10.0000 mg | Freq: Every day | RECTAL | Status: DC | PRN

## 2013-08-23 MED ORDER — SENNOSIDES-DOCUSATE SODIUM 8.6-50 MG PO TABS
2.0000 | ORAL_TABLET | ORAL | Status: DC
  Administered 2013-08-23 – 2013-08-24 (×2): 2 via ORAL
  Filled 2013-08-23 (×2): qty 2

## 2013-08-23 MED ORDER — ONDANSETRON HCL 4 MG/2ML IJ SOLN: 4.0000 mg | Freq: Four times a day (QID) | INTRAMUSCULAR | Status: DC | PRN

## 2013-08-23 MED ORDER — PHENYLEPHRINE 40 MCG/ML (10ML) SYRINGE FOR IV PUSH (FOR BLOOD PRESSURE SUPPORT)
80.0000 ug | PREFILLED_SYRINGE | INTRAVENOUS | Status: DC | PRN
  Filled 2013-08-23: qty 2

## 2013-08-23 MED ORDER — SODIUM CHLORIDE 0.9 % IJ SOLN: 3.0000 mL | INTRAMUSCULAR | Status: DC | PRN

## 2013-08-23 MED ORDER — LACTATED RINGERS IV SOLN
500.0000 mL | Freq: Once | INTRAVENOUS | Status: AC
  Administered 2013-08-23: 500 mL via INTRAVENOUS

## 2013-08-23 NOTE — MAU Note (Signed)
Gush of light pink fluid around 1230. Contractions every 15 minutes with vaginal bleeding when wiping.

## 2013-08-23 NOTE — H&P (Signed)
Lynn Wilcox is a 32 y.o. female G2P1001 at 75.0 with EDC established by 6wk scan presenting for eval of ctx tonight. Denies bldg; reports an episode of ? Leaking but none since. She receives her prenatal care at the [facility] starting at 13wks  and it has been essentially unremarkable. History  OB History   Grav Para Term Preterm Abortions TAB SAB Ect Mult Living   2 1 1       1      Past Medical History  Diagnosis Date  . Obesity   . Seasonal allergies   . Asthma     Uses inhaler prn   Past Surgical History  Procedure Laterality Date  . Wisdom tooth extraction    . Breast biopsy      age 32   Family History: family history is not on file. Social History:  reports that she has never smoked. She has never used smokeless tobacco. She reports that she does not drink alcohol or use illicit drugs.   Prenatal Transfer Tool  Maternal Diabetes: No- elevated 1hr with nl 3hr GTT Genetic Screening: Normal Maternal Ultrasounds/Referrals: Normal Fetal Ultrasounds or other Referrals:  None Maternal Substance Abuse:  No Significant Maternal Medications:  Meds include: Protonix Significant Maternal Lab Results:  Lab values include: Group B Strep negative Other Comments:  None  ROS  Dilation: 4 Effacement (%): 80 Station: -2 Exam by:: L. Munford RN Blood pressure 139/82, pulse 88, temperature 98.6 F (37 C), temperature source Oral, resp. rate 18, height 5\' 2"  (1.575 m), weight 109.317 kg (241 lb), last menstrual period 11/18/2012, SpO2 100.00%. Exam Physical Exam  Constitutional: She is oriented to person, place, and time. She appears well-developed.  HENT:  Head: Normocephalic.  Cardiovascular: Normal rate.   Respiratory: Effort normal.  GI:  EFM 130s +accels, no decels Ctx q 4-5 mins with interspersed irritability  Genitourinary:  Cx changed from 3 to 4/80/-2 with increased bldy show  Musculoskeletal: Normal range of motion.  Neurological: She is alert and oriented to person,  place, and time.  Skin: Skin is warm and dry.  Psychiatric: She has a normal mood and affect. Her behavior is normal. Thought content normal.    Prenatal labs: ABO, Rh: B/POS/-- (04/15 1106) Antibody: NEG (04/15 1106) Rubella: 2.77 (04/15 1106) RPR: NON REAC (08/26 1208)  HBsAg: NEGATIVE (04/15 1106)  HIV: NON REACTIVE (08/26 1208)  GBS: Negative (10/22 0000)   Assessment/Plan: IUP at 40.0 Latent phase of labor  Admit to [facility] Expectant management Anticipate SVD   Cam Hai 08/23/2013, 6:36 AM

## 2013-08-23 NOTE — Anesthesia Preprocedure Evaluation (Signed)
Anesthesia Evaluation  Patient identified by MRN, date of birth, ID band Patient awake    Reviewed: Allergy & Precautions, H&P , NPO status , Patient's Chart, lab work & pertinent test results  History of Anesthesia Complications Negative for: history of anesthetic complications  Airway Mallampati: II  TM Distance: >3 FB Neck ROM: full    Dental no notable dental hx. (+) Teeth Intact   Pulmonary neg pulmonary ROS, asthma ,  breath sounds clear to auscultation  Pulmonary exam normal       Cardiovascular negative cardio ROS  Rhythm:regular Rate:Normal     Neuro/Psych negative neurological ROS  negative psych ROS   GI/Hepatic negative GI ROS, Neg liver ROS,   Endo/Other  negative endocrine ROSMorbid obesity  Renal/GU negative Renal ROS  negative genitourinary   Musculoskeletal   Abdominal Normal abdominal exam  (+)   Peds  Hematology negative hematology ROS (+)   Anesthesia Other Findings   Reproductive/Obstetrics (+) Pregnancy                             Anesthesia Physical Anesthesia Plan  ASA: II  Anesthesia Plan: Epidural   Post-op Pain Management:    Induction:   Airway Management Planned:   Additional Equipment:   Intra-op Plan:   Post-operative Plan:   Informed Consent: I have reviewed the patients History and Physical, chart, labs and discussed the procedure including the risks, benefits and alternatives for the proposed anesthesia with the patient or authorized representative who has indicated his/her understanding and acceptance.     Plan Discussed with:   Anesthesia Plan Comments:         Anesthesia Quick Evaluation  

## 2013-08-23 NOTE — Progress Notes (Signed)
Kim booker cnm on phone - aware that pts b/p is increasing - no new orders at present

## 2013-08-23 NOTE — Anesthesia Postprocedure Evaluation (Signed)
  Anesthesia Post Note  Patient: Lynn Wilcox  Procedure(s) Performed: * No procedures listed *  Anesthesia type: Epidural  Patient location: Mother/Baby  Post pain: Pain level controlled  Post assessment: Post-op Vital signs reviewed  Last Vitals:  Filed Vitals:   08/23/13 1550  BP: 123/61  Pulse: 112  Temp: 37 C  Resp: 20    Post vital signs: Reviewed  Level of consciousness:alert  Complications: No apparent anesthesia complications

## 2013-08-23 NOTE — Anesthesia Procedure Notes (Signed)
Epidural Patient location during procedure: OB  Staffing Anesthesiologist: Fayette Hamada Performed by: anesthesiologist   Preanesthetic Checklist Completed: patient identified, site marked, surgical consent, pre-op evaluation, timeout performed, IV checked, risks and benefits discussed and monitors and equipment checked  Epidural Patient position: sitting Prep: ChloraPrep Patient monitoring: heart rate, continuous pulse ox and blood pressure Approach: right paramedian Injection technique: LOR saline  Needle:  Needle type: Tuohy  Needle gauge: 17 G Needle length: 9 cm and 9 Needle insertion depth: 8 cm Catheter type: closed end flexible Catheter size: 20 Guage Catheter at skin depth: 14 cm Test dose: negative  Assessment Events: blood not aspirated, injection not painful, no injection resistance, negative IV test and no paresthesia  Additional Notes   Patient tolerated the insertion well without complications.   

## 2013-08-23 NOTE — MAU Note (Signed)
Report called to Dana RN in BS.  

## 2013-08-23 NOTE — Progress Notes (Signed)
Called Joellyn Haff, CNM to ask if urine protein creatinine was still needed; was not collected when ordered in labor and delivery. Blood pressures have been much better since delivery. Kim ordered to cancel the urine protein creatinine level for now and continue to monitor blood pressures. Earl Gala, [facility] [place]

## 2013-08-23 NOTE — Progress Notes (Signed)
Lagretta Loseke is a 32 y.o. G2P1001 at [redacted]w[redacted]d admitted for active labor  Subjective: Mostly comfortable w/ epidural, feeling pressure w/ uc's  Objective: BP 149/70  Pulse 102  Temp(Src) 98.3 F (36.8 C) (Oral)  Resp 16  Ht 5\' 2"  (1.575 m)  Wt 109.317 kg (241 lb)  BMI 44.07 kg/m2  SpO2 100%  LMP 11/18/2012      FHT:  FHR: 140 bpm, variability: moderate,  accelerations:  Present,  decelerations:  Absent UC:   regular, every 2-3 minutes SVE:  6/90/-1, BBOW AROM mod amount clear fluid  Labs: Lab Results  Component Value Date   WBC 11.1* 08/23/2013   HGB 11.8* 08/23/2013   HCT 34.3* 08/23/2013   MCV 79.0 08/23/2013   PLT 178 08/23/2013    Assessment / Plan: Spontaneous labor, progressing normally  Labor: Progressing normally Preeclampsia:  n/a Fetal Wellbeing:  Category I Pain Control:  Epidural I/D:  n/a Anticipated MOD:  NSVD  Marge Duncans 08/23/2013, 9:06 AM

## 2013-08-23 NOTE — Lactation Note (Addendum)
This note was copied from the chart of Lynn Lakevia Perris. Lactation Consultation Note  Patient Name: Lynn Wilcox WUJWJ'[id number] Date: 08/23/2013 Reason for consult: Initial assessment Mom was attempting to latch baby with baby on pillow and leaning in to latch. Assisted Mom with positioning and obtaining good depth with latch. BF basics reviewed with parents. Encouraged to que base BF, cluster feeding discussed. Lactation brochure left for review, advised of OP services and support group. This is Mom's 2nd baby but 1st time BF. 1st baby would not latch.  Advised to ask for assist as needed.   Maternal Data Formula Feeding for Exclusion: No Infant to breast within first hour of birth: Yes Has patient been taught Hand Expression?: Yes Does the patient have breastfeeding experience prior to this delivery?: No  Feeding Feeding Type: Breast Fed Length of feed: 5 min  LATCH Score/Interventions Latch: Repeated attempts needed to sustain latch, nipple held in mouth throughout feeding, stimulation needed to elicit sucking reflex. Intervention(s): Adjust position;Assist with latch;Breast massage;Breast compression  Audible Swallowing: A few with stimulation  Type of Nipple: Everted at rest and after stimulation  Comfort (Breast/Nipple): Soft / non-tender     Hold (Positioning): Assistance needed to correctly position infant at breast and maintain latch. Intervention(s): Breastfeeding basics reviewed;Support Pillows;Position options;Skin to skin  LATCH Score: 7  Lactation Tools Discussed/Used WIC Program: No   Consult Status Consult Status: Follow-up Date: 08/24/13 Follow-up type: In-patient    Alfred Levins 08/23/2013, 11:25 PM

## 2013-08-24 MED ORDER — IBUPROFEN 600 MG PO TABS: 600.0000 mg | ORAL_TABLET | Freq: Four times a day (QID) | ORAL | Status: DC | PRN

## 2013-08-24 NOTE — Progress Notes (Signed)
Pt not discharged 2/2 infant feeding and bili. Will follow up tomrrow.

## 2013-08-24 NOTE — Lactation Note (Signed)
This note was copied from the chart of Lynn Crystalmarie Yasin. Lactation Consultation Note  Patient Name: Lynn Wilcox WUJWJ'X Date: 08/24/2013 Reason for consult: Difficult latch;Follow-up assessment Mom has not been able to get baby to stay awake at the breast since last evening. Mom wants to supplement baby. She pumped with hand pump and received few drops of colostrum. Discussed with Mom ways to supplement and Mom agreed to try SNS so we could keep baby at the breast. Mom attempted to latch baby, but she has very large heavy breasts that she is not supporting well and baby cannot sustain a latch. Assisted Mom with positioning and had her use the pillows to support her breast. Baby was able to latch well in football position on his side. We set up SNS with formula, baby stayed active at the breast with good swallows for 20 minutes. Mom seemed very relieved. DEBP set up for Mom and discussed feeding plan. Demonstrated to both parents how to clean SNS, reviewed guidelines for supplementing with breastfeeding. Encouraged to give baby 15 ml of EBM of formula with feedings. Mom to use SNS to supplement, alternating breast with each feeding. Mom to post pump on preemie setting for 15 minutes, while FOB cleans SNS. Parents reported they agreed with plan. Advised Mom to call as needed for assistance.   Maternal Data    Feeding Feeding Type: Formula Length of feed: 30 min  LATCH Score/Interventions Latch: Repeated attempts needed to sustain latch, nipple held in mouth throughout feeding, stimulation needed to elicit sucking reflex. Intervention(s): Adjust position;Assist with latch;Breast massage;Breast compression  Audible Swallowing: Spontaneous and intermittent (using SNS to supplement)  Type of Nipple: Everted at rest and after stimulation (short nipple shaft) Intervention(s): Hand pump;Double electric pump  Comfort (Breast/Nipple): Soft / non-tender     Hold (Positioning): Assistance needed  to correctly position infant at breast and maintain latch. Intervention(s): Breastfeeding basics reviewed;Support Pillows;Position options;Skin to skin  LATCH Score: 8  Lactation Tools Discussed/Used Tools: Pump;Supplemental Nutrition System Breast pump type: Double-Electric Breast Pump   Consult Status Consult Status: Follow-up Date: 08/25/13 Follow-up type: In-patient    Alfred Levins 08/24/2013, 5:50 PM

## 2013-08-24 NOTE — Discharge Summary (Signed)
Obstetric Discharge Summary Reason for Admission: onset of labor Prenatal Procedures: ultrasound Intrapartum Procedures: spontaneous vaginal delivery Postpartum Procedures: none Complications-Operative and Postpartum: none Eating, drinking, voiding, ambulating well.  +flatus.  Lochia and pain wnl.  Denies dizziness, lightheadedness, or sob. No complaints. States infant not really wanting to BF, she is trying q 2-3hrs, but infant just falling asleep when she attempts. LC came in last night when she first moved to [facility]. Pt worked with lactation on 11/16. Doing well. Meeting milestones and will discharge today. MOC: condoms.  Hemoglobin  Date Value Range Status  08/23/2013 11.8* 12.0 - 15.0 g/dL Final     HCT  Date Value Range Status  08/23/2013 34.3* 36.0 - 46.0 % Final    Physical Exam:  General: alert, cooperative and no distress Lochia: appropriate Uterine Fundus: firm Incision: n/a DVT Evaluation: No evidence of DVT seen on physical exam. Negative Homan's sign. No cords or calf tenderness. No significant calf/ankle edema.  Discharge Diagnoses: Term Pregnancy-delivered  Discharge Information: Date: 08/25/2013 Activity: pelvic rest Diet: routine Medications: PNV and Ibuprofen Condition: stable Instructions: refer to practice specific booklet Discharge to: home Follow-up Information   Schedule an appointment as soon as possible for a visit with [facility]. (4-6 weeks for your postpartum visit)    Specialty:  Obstetrics and Gynecology   Contact information:   [address] [place] [address] [phone]      Newborn Data: Live born female  Birth Weight: 8 lb 8.9 oz (3880 g) APGAR: 8, 9  Home with mother pending Ped assessment Breastfeeding, discussed contraception options- pt declines any hormonal option at this time, and would like to use condoms.  States she has been w/ her partner for 55yrs now, and condoms have worked well for them. Desires OP  circumcision.   Tawana Scale 08/25/2013, 7:41 AM

## 2013-08-24 NOTE — Progress Notes (Signed)
Patient set up with double electric pump.

## 2013-08-25 ENCOUNTER — Encounter (HOSPITAL_COMMUNITY): Payer: Self-pay | Admitting: *Deleted

## 2013-08-25 NOTE — Progress Notes (Signed)
Ur chart review completed.  

## 2013-08-25 NOTE — Lactation Note (Signed)
This note was copied from the chart of Lynn Wilcox. Lactation Consultation Note Mom states that breast feeding has not been going well; states that she did like using the SNS but that now her nipples are so sore that she cannot latch baby at all, nor can she pump comfortably. Mom states that she started using a bottle with formula to give her breasts a break.  Mom states that she does want to continue to breast feed. Mom request another SNS so she can restart feeding at the breast when nipples feel better. SNS provided at mom's request. Discussed the importance of continuing to pump 8 times a day and once at night to protect milk supply; discussed strategies to improve pump comfort - lubricate flange with coconut oil or extra virgin olive oil, and to hand express if pumping is too painful. Mom will use hand pump until she is able to obtain a double electric. Comfort gels provided, with instructions for use. Written instructions provided. Questions answered. Mom verbalize understanding and agreement. Offered to make out patient appointment; mom states she will call when she is ready; office number provided.  Enc mom to attend the BFSG.   Patient Name: Lynn Wilcox Date: 08/25/2013     Maternal Data    Feeding Feeding Type: Breast Fed Length of feed: 15 min  LATCH Score/Interventions                      Lactation Tools Discussed/Used     Consult Status      Lenard Forth 08/25/2013, 10:44 AM

## 2013-08-26 NOTE — Discharge Summary (Signed)
Attestation of Attending Supervision of Advanced Practitioner (CNM/NP): Evaluation and management procedures were performed by the Advanced Practitioner under my supervision and collaboration.  I have reviewed the Advanced Practitioner's note and chart, and I agree with the management and plan.  Tammy Wickliffe 08/26/2013 9:25 AM   

## 2013-08-27 ENCOUNTER — Encounter: Payer: Medicaid Other | Admitting: Advanced Practice Midwife

## 2013-09-25 ENCOUNTER — Ambulatory Visit (INDEPENDENT_AMBULATORY_CARE_PROVIDER_SITE_OTHER): Payer: Medicaid Other | Admitting: Obstetrics and Gynecology

## 2013-09-25 ENCOUNTER — Encounter: Payer: Self-pay | Admitting: Obstetrics and Gynecology

## 2013-09-25 VITALS — BP 127/84 | HR 66 | Temp 96.9°F | Ht 64.0 in | Wt 220.4 lb

## 2013-09-25 DIAGNOSIS — G5600 Carpal tunnel syndrome, unspecified upper limb: Secondary | ICD-10-CM

## 2013-09-25 DIAGNOSIS — G5603 Carpal tunnel syndrome, bilateral upper limbs: Secondary | ICD-10-CM | POA: Insufficient documentation

## 2013-09-25 NOTE — Patient Instructions (Signed)

## 2013-09-25 NOTE — Progress Notes (Signed)
  Subjective:     Lynn Wilcox is a 32 y.o. female G2P2002 who presents for a postpartum visit. She is 1 month 2d postpartum following a spontaneous vaginal delivery. I have fully reviewed the prenatal and intrapartum course. The delivery was at 40 gestational weeks. Outcome: spontaneous vaginal delivery. Anesthesia: epidural. Postpartum course has been uncomplicated. Baby's course has been uncomplicated. Baby is feeding by breast. Bleeding staining only. Bowel function is normal. Bladder function is normal. Patient is not sexually active. Contraception method is condoms. Postpartum depression screening: negative. Stress with newborn, holidays and her mother was visiting and had a stroke. Coping well. Has 79 yo son.    Review of Systems Pertinent items are noted in HPI.  Still having bilateral CTS symptoms but not as severe as during pregnnacy. The following portions of the patient's history were reviewed and updated as appropriate: allergies, current medications, past medical history, past surgical history, social history, problem list.  Objective:    BP 127/84  Pulse 66  Temp(Src) 96.9 F (36.1 C) (Oral)  Ht 5\' 4"  (1.626 m)  Wt 220 lb 6.4 oz (99.973 kg)  BMI 37.81 kg/m2  Breastfeeding? Yes  General:  alert, cooperative and no distress   Breasts:  inspection negative, no nipple discharge or bleeding, no masses or nodularity palpable  Lungs: clear to auscultation bilaterally  Heart:  regular rate and rhythm, S1, S2 normal, no murmur, click, rub or gallop  Abdomen: obese, 4 cm diastasis   Vulva:  not evaluated  Vagina: not evaluated  Cervix:  not evaluated  Corpus: not examined  Adnexa:  not evaluated  Rectal Exam: Not performed.        Assessment:     4 postpartum exam. Pap smear not done at today's visit.  Persistent  bilat CTS symtoms Plan:    1. Contraception: condoms and breastfeeding> declines LARC or other method at this time. 2. Continue PNVs 3. Follow up in: 6 months or  as needed.

## 2014-06-04 ENCOUNTER — Encounter: Payer: Self-pay | Admitting: Obstetrics and Gynecology

## 2014-06-04 ENCOUNTER — Ambulatory Visit (INDEPENDENT_AMBULATORY_CARE_PROVIDER_SITE_OTHER): Payer: Medicaid Other | Admitting: Obstetrics and Gynecology

## 2014-06-04 VITALS — BP 110/80 | HR 72 | Ht 63.0 in | Wt 249.0 lb

## 2014-06-04 DIAGNOSIS — Z01419 Encounter for gynecological examination (general) (routine) without abnormal findings: Secondary | ICD-10-CM

## 2014-06-04 DIAGNOSIS — Z113 Encounter for screening for infections with a predominantly sexual mode of transmission: Secondary | ICD-10-CM

## 2014-06-04 NOTE — Progress Notes (Signed)
  Subjective:     Lynn Wilcox is a 33 y.o. female G2P2 with LMP 05/18/2014 and BMI 44 who is here for a comprehensive physical exam. The patient reports occasional vaginal pruritis. She denies any abnormal discharge. She desires full STD testing today.  History   Social History  . Marital Status: Single    Spouse Name: N/A    Number of Children: N/A  . Years of Education: N/A   Occupational History  . Not on file.   Social History Main Topics  . Smoking status: Never Smoker   . Smokeless tobacco: Never Used  . Alcohol Use: No  . Drug Use: No  . Sexual Activity: Yes    Birth Control/ Protection: None   Other Topics Concern  . Not on file   Social History Narrative  . No narrative on file   Health Maintenance  Topic Date Due  . Influenza Vaccine  05/09/2014  . Pap Smear  02/19/2016  . Tetanus/tdap  06/04/2023   Past Medical History  Diagnosis Date  . Obesity   . Seasonal allergies   . Asthma     Uses inhaler prn   Past Surgical History  Procedure Laterality Date  . Wisdom tooth extraction    . Breast biopsy      age 53   History  Substance Use Topics  . Smoking status: Never Smoker   . Smokeless tobacco: Never Used  . Alcohol Use: No   No family history on file.     Review of Systems A comprehensive review of systems was negative.   Objective:      GENERAL: Well-developed, well-nourished female in no acute distress.  HEENT: Normocephalic, atraumatic. Sclerae anicteric.  NECK: Supple. Normal thyroid.  LUNGS: Clear to auscultation bilaterally.  HEART: Regular rate and rhythm. BREASTS: Symmetric in size. No palpable masses or lymphadenopathy, skin changes, or nipple drainage. ABDOMEN: Soft, nontender, nondistended. No organomegaly. PELVIC: Normal external female genitalia. Vagina is pink and rugated.  Normal discharge. Normal appearing cervix. Uterus is normal in size. No adnexal mass or tenderness. EXTREMITIES: No cyanosis, clubbing, or edema, 2+  distal pulses.    Assessment:    Healthy female exam.      Plan:     Pap smear collected Wet prep collected HIV, RPR, Hepatitis B and C ordered per patient request Patient advised to perform monthly self breast and vulva exam Patient will be contacted with any abnormal results See After Visit Summary for Counseling Recommendations

## 2014-06-04 NOTE — Progress Notes (Signed)
Patient  Is here today for yearly exam and would like screened for sti.

## 2014-06-04 NOTE — Patient Instructions (Signed)
Preventive Care for Adults A healthy lifestyle and preventive care can promote health and wellness. Preventive health guidelines for women include the following key practices.  A routine yearly physical is a good way to check with your health care provider about your health and preventive screening. It is a chance to share any concerns and updates on your health and to receive a thorough exam.  Visit your dentist for a routine exam and preventive care every 6 months. Brush your teeth twice a day and floss once a day. Good oral hygiene prevents tooth decay and gum disease.  The frequency of eye exams is based on your age, health, family medical history, use of contact lenses, and other factors. Follow your health care provider's recommendations for frequency of eye exams.  Eat a healthy diet. Foods like vegetables, fruits, whole grains, low-fat dairy products, and lean protein foods contain the nutrients you need without too many calories. Decrease your intake of foods high in solid fats, added sugars, and salt. Eat the right amount of calories for you.Get information about a proper diet from your health care provider, if necessary.  Regular physical exercise is one of the most important things you can do for your health. Most adults should get at least 150 minutes of moderate-intensity exercise (any activity that increases your heart rate and causes you to sweat) each week. In addition, most adults need muscle-strengthening exercises on 2 or more days a week.  Maintain a healthy weight. The body mass index (BMI) is a screening tool to identify possible weight problems. It provides an estimate of body fat based on height and weight. Your health care provider can find your BMI and can help you achieve or maintain a healthy weight.For adults 20 years and older:  A BMI below 18.5 is considered underweight.  A BMI of 18.5 to 24.9 is normal.  A BMI of 25 to 29.9 is considered overweight.  A BMI of  30 and above is considered obese.  Maintain normal blood lipids and cholesterol levels by exercising and minimizing your intake of saturated fat. Eat a balanced diet with plenty of fruit and vegetables. Blood tests for lipids and cholesterol should begin at age 77 and be repeated every 5 years. If your lipid or cholesterol levels are high, you are over 50, or you are at high risk for heart disease, you may need your cholesterol levels checked more frequently.Ongoing high lipid and cholesterol levels should be treated with medicines if diet and exercise are not working.  If you smoke, find out from your health care provider how to quit. If you do not use tobacco, do not start.  Lung cancer screening is recommended for adults aged 11-80 years who are at high risk for developing lung cancer because of a history of smoking. A yearly low-dose CT scan of the lungs is recommended for people who have at least a 30-pack-year history of smoking and are a current smoker or have quit within the past 15 years. A pack year of smoking is smoking an average of 1 pack of cigarettes a day for 1 year (for example: 1 pack a day for 30 years or 2 packs a day for 15 years). Yearly screening should continue until the smoker has stopped smoking for at least 15 years. Yearly screening should be stopped for people who develop a health problem that would prevent them from having lung cancer treatment.  If you are pregnant, do not drink alcohol. If you are breastfeeding,  be very cautious about drinking alcohol. If you are not pregnant and choose to drink alcohol, do not have more than 1 drink per day. One drink is considered to be 12 ounces (355 mL) of beer, 5 ounces (148 mL) of wine, or 1.5 ounces (44 mL) of liquor.  Avoid use of street drugs. Do not share needles with anyone. Ask for help if you need support or instructions about stopping the use of drugs.  High blood pressure causes heart disease and increases the risk of  stroke. Your blood pressure should be checked at least every 1 to 2 years. Ongoing high blood pressure should be treated with medicines if weight loss and exercise do not work.  If you are 60-61 years old, ask your health care provider if you should take aspirin to prevent strokes.  Diabetes screening involves taking a blood sample to check your fasting blood sugar level. This should be done once every 3 years, after age 77, if you are within normal weight and without risk factors for diabetes. Testing should be considered at a younger age or be carried out more frequently if you are overweight and have at least 1 risk factor for diabetes.  Breast cancer screening is essential preventive care for women. You should practice "breast self-awareness." This means understanding the normal appearance and feel of your breasts and may include breast self-examination. Any changes detected, no matter how small, should be reported to a health care provider. Women in their 85s and 30s should have a clinical breast exam (CBE) by a health care provider as part of a regular health exam every 1 to 3 years. After age 14, women should have a CBE every year. Starting at age 66, women should consider having a mammogram (breast X-ray test) every year. Women who have a family history of breast cancer should talk to their health care provider about genetic screening. Women at a high risk of breast cancer should talk to their health care providers about having an MRI and a mammogram every year.  Breast cancer gene (BRCA)-related cancer risk assessment is recommended for women who have family members with BRCA-related cancers. BRCA-related cancers include breast, ovarian, tubal, and peritoneal cancers. Having family members with these cancers may be associated with an increased risk for harmful changes (mutations) in the breast cancer genes BRCA1 and BRCA2. Results of the assessment will determine the need for genetic counseling and  BRCA1 and BRCA2 testing.  Routine pelvic exams to screen for cancer are no longer recommended for nonpregnant women who are considered low risk for cancer of the pelvic organs (ovaries, uterus, and vagina) and who do not have symptoms. Ask your health care provider if a screening pelvic exam is right for you.  If you have had past treatment for cervical cancer or a condition that could lead to cancer, you need Pap tests and screening for cancer for at least 20 years after your treatment. If Pap tests have been discontinued, your risk factors (such as having a new sexual partner) need to be reassessed to determine if screening should be resumed. Some women have medical problems that increase the chance of getting cervical cancer. In these cases, your health care provider may recommend more frequent screening and Pap tests.  The HPV test is an additional test that may be used for cervical cancer screening. The HPV test looks for the virus that can cause the cell changes on the cervix. The cells collected during the Pap test can be  tested for HPV. The HPV test could be used to screen women aged 83 years and older, and should be used in women of any age who have unclear Pap test results. After the age of 59, women should have HPV testing at the same frequency as a Pap test.  Colorectal cancer can be detected and often prevented. Most routine colorectal cancer screening begins at the age of 85 years and continues through age 81 years. However, your health care provider may recommend screening at an earlier age if you have risk factors for colon cancer. On a yearly basis, your health care provider may provide home test kits to check for hidden blood in the stool. Use of a small camera at the end of a tube, to directly examine the colon (sigmoidoscopy or colonoscopy), can detect the earliest forms of colorectal cancer. Talk to your health care provider about this at age 55, when routine screening begins. Direct  exam of the colon should be repeated every 5-10 years through age 7 years, unless early forms of pre-cancerous polyps or small growths are found.  People who are at an increased risk for hepatitis B should be screened for this virus. You are considered at high risk for hepatitis B if:  You were born in a country where hepatitis B occurs often. Talk with your health care provider about which countries are considered high risk.  Your parents were born in a high-risk country and you have not received a shot to protect against hepatitis B (hepatitis B vaccine).  You have HIV or AIDS.  You use needles to inject street drugs.  You live with, or have sex with, someone who has hepatitis B.  You get hemodialysis treatment.  You take certain medicines for conditions like cancer, organ transplantation, and autoimmune conditions.  Hepatitis C blood testing is recommended for all people born from [birth year] through 1965 and any individual with known risks for hepatitis C.  Practice safe sex. Use condoms and avoid high-risk sexual practices to reduce the spread of sexually transmitted infections (STIs). STIs include gonorrhea, chlamydia, syphilis, trichomonas, herpes, HPV, and human immunodeficiency virus (HIV). Herpes, HIV, and HPV are viral illnesses that have no cure. They can result in disability, cancer, and death.  You should be screened for sexually transmitted illnesses (STIs) including gonorrhea and chlamydia if:  You are sexually active and are younger than 24 years.  You are older than 24 years and your health care provider tells you that you are at risk for this type of infection.  Your sexual activity has changed since you were last screened and you are at an increased risk for chlamydia or gonorrhea. Ask your health care provider if you are at risk.  If you are at risk of being infected with HIV, it is recommended that you take a prescription medicine daily to prevent HIV infection. This is  called preexposure prophylaxis (PrEP). You are considered at risk if:  You are a heterosexual woman, are sexually active, and are at increased risk for HIV infection.  You take drugs by injection.  You are sexually active with a partner who has HIV.  Talk with your health care provider about whether you are at high risk of being infected with HIV. If you choose to begin PrEP, you should first be tested for HIV. You should then be tested every 3 months for as long as you are taking PrEP.  Osteoporosis is a disease in which the bones lose minerals and strength  with aging. This can result in serious bone fractures or breaks. The risk of osteoporosis can be identified using a bone density scan. Women ages 65 years and over and women at risk for fractures or osteoporosis should discuss screening with their health care providers. Ask your health care provider whether you should take a calcium supplement or vitamin D to reduce the rate of osteoporosis.  Menopause can be associated with physical symptoms and risks. Hormone replacement therapy is available to decrease symptoms and risks. You should talk to your health care provider about whether hormone replacement therapy is right for you.  Use sunscreen. Apply sunscreen liberally and repeatedly throughout the day. You should seek shade when your shadow is shorter than you. Protect yourself by wearing long sleeves, pants, a wide-brimmed hat, and sunglasses year round, whenever you are outdoors.  Once a month, do a whole body skin exam, using a mirror to look at the skin on your back. Tell your health care provider of new moles, moles that have irregular borders, moles that are larger than a pencil eraser, or moles that have changed in shape or color.  Stay current with required vaccines (immunizations).  Influenza vaccine. All adults should be immunized every year.  Tetanus, diphtheria, and acellular pertussis (Td, Tdap) vaccine. Pregnant women should  receive 1 dose of Tdap vaccine during each pregnancy. The dose should be obtained regardless of the length of time since the last dose. Immunization is preferred during the 27th-36th week of gestation. An adult who has not previously received Tdap or who does not know her vaccine status should receive 1 dose of Tdap. This initial dose should be followed by tetanus and diphtheria toxoids (Td) booster doses every 10 years. Adults with an unknown or incomplete history of completing a 3-dose immunization series with Td-containing vaccines should begin or complete a primary immunization series including a Tdap dose. Adults should receive a Td booster every 10 years.  Varicella vaccine. An adult without evidence of immunity to varicella should receive 2 doses or a second dose if she has previously received 1 dose. Pregnant females who do not have evidence of immunity should receive the first dose after pregnancy. This first dose should be obtained before leaving the health care facility. The second dose should be obtained 4-8 weeks after the first dose.  Human papillomavirus (HPV) vaccine. Females aged 13-26 years who have not received the vaccine previously should obtain the 3-dose series. The vaccine is not recommended for use in pregnant females. However, pregnancy testing is not needed before receiving a dose. If a female is found to be pregnant after receiving a dose, no treatment is needed. In that case, the remaining doses should be delayed until after the pregnancy. Immunization is recommended for any person with an immunocompromised condition through the age of 26 years if she did not get any or all doses earlier. During the 3-dose series, the second dose should be obtained 4-8 weeks after the first dose. The third dose should be obtained 24 weeks after the first dose and 16 weeks after the second dose.  Zoster vaccine. One dose is recommended for adults aged 60 years or older unless certain conditions are  present.  Measles, mumps, and rubella (MMR) vaccine. Adults born before 1957 generally are considered immune to measles and mumps. Adults born in 1957 or later should have 1 or more doses of MMR vaccine unless there is a contraindication to the vaccine or there is laboratory evidence of immunity to   each of the three diseases. A routine second dose of MMR vaccine should be obtained at least 28 days after the first dose for students attending postsecondary schools, health care workers, or international travelers. People who received inactivated measles vaccine or an unknown type of measles vaccine during 1963-1967 should receive 2 doses of MMR vaccine. People who received inactivated mumps vaccine or an unknown type of mumps vaccine before 1979 and are at high risk for mumps infection should consider immunization with 2 doses of MMR vaccine. For females of childbearing age, rubella immunity should be determined. If there is no evidence of immunity, females who are not pregnant should be vaccinated. If there is no evidence of immunity, females who are pregnant should delay immunization until after pregnancy. Unvaccinated health care workers born before [birth year] who lack laboratory evidence of measles, mumps, or rubella immunity or laboratory confirmation of disease should consider measles and mumps immunization with 2 doses of MMR vaccine or rubella immunization with 1 dose of MMR vaccine.  Pneumococcal 13-valent conjugate (PCV13) vaccine. When indicated, a person who is uncertain of her immunization history and has no record of immunization should receive the PCV13 vaccine. An adult aged 23 years or older who has certain medical conditions and has not been previously immunized should receive 1 dose of PCV13 vaccine. This PCV13 should be followed with a dose of pneumococcal polysaccharide (PPSV23) vaccine. The PPSV23 vaccine dose should be obtained at least 8 weeks after the dose of PCV13 vaccine. An adult aged 71  years or older who has certain medical conditions and previously received 1 or more doses of PPSV23 vaccine should receive 1 dose of PCV13. The PCV13 vaccine dose should be obtained 1 or more years after the last PPSV23 vaccine dose.  Pneumococcal polysaccharide (PPSV23) vaccine. When PCV13 is also indicated, PCV13 should be obtained first. All adults aged 59 years and older should be immunized. An adult younger than age 67 years who has certain medical conditions should be immunized. Any person who resides in a nursing home or long-term care facility should be immunized. An adult smoker should be immunized. People with an immunocompromised condition and certain other conditions should receive both PCV13 and PPSV23 vaccines. People with human immunodeficiency virus (HIV) infection should be immunized as soon as possible after diagnosis. Immunization during chemotherapy or radiation therapy should be avoided. Routine use of PPSV23 vaccine is not recommended for American Indians, [place] Natives, or people younger than 65 years unless there are medical conditions that require PPSV23 vaccine. When indicated, people who have unknown immunization and have no record of immunization should receive PPSV23 vaccine. One-time revaccination 5 years after the first dose of PPSV23 is recommended for people aged 19-64 years who have chronic kidney failure, nephrotic syndrome, asplenia, or immunocompromised conditions. People who received 1-2 doses of PPSV23 before age 68 years should receive another dose of PPSV23 vaccine at age 87 years or later if at least 5 years have passed since the previous dose. Doses of PPSV23 are not needed for people immunized with PPSV23 at or after age 42 years.  Meningococcal vaccine. Adults with asplenia or persistent complement component deficiencies should receive 2 doses of quadrivalent meningococcal conjugate (MenACWY-D) vaccine. The doses should be obtained at least 2 months apart.  Microbiologists working with certain meningococcal bacteria, [place] recruits, people at risk during an outbreak, and people who travel to or live in countries with a high rate of meningitis should be immunized. A first-year college student up through age  21 years who is living in a residence hall should receive a dose if she did not receive a dose on or after her 16th birthday. Adults who have certain high-risk conditions should receive one or more doses of vaccine.  Hepatitis A vaccine. Adults who wish to be protected from this disease, have certain high-risk conditions, work with hepatitis A-infected animals, work in hepatitis A research labs, or travel to or work in countries with a high rate of hepatitis A should be immunized. Adults who were previously unvaccinated and who anticipate close contact with an international adoptee during the first 60 days after arrival in the [place] from a country with a high rate of hepatitis A should be immunized.  Hepatitis B vaccine. Adults who wish to be protected from this disease, have certain high-risk conditions, may be exposed to blood or other infectious body fluids, are household contacts or sex partners of hepatitis B positive people, are clients or workers in certain care facilities, or travel to or work in countries with a high rate of hepatitis B should be immunized.  Haemophilus influenzae type b (Hib) vaccine. A previously unvaccinated person with asplenia or sickle cell disease or having a scheduled splenectomy should receive 1 dose of Hib vaccine. Regardless of previous immunization, a recipient of a hematopoietic stem cell transplant should receive a 3-dose series 6-12 months after her successful transplant. Hib vaccine is not recommended for adults with HIV infection. Preventive Services / Frequency Ages 64 to 68 years  Blood pressure check.** / Every 1 to 2 years.  Lipid and cholesterol check.** / Every 5 years beginning at age  22.  Clinical breast exam.** / Every 3 years for women in their 88s and 53s.  BRCA-related cancer risk assessment.** / For women who have family members with a BRCA-related cancer (breast, ovarian, tubal, or peritoneal cancers).  Pap test.** / Every 2 years from ages 90 through 51. Every 3 years starting at age 21 through age 56 or 3 with a history of 3 consecutive normal Pap tests.  HPV screening.** / Every 3 years from ages 24 through ages 1 to 46 with a history of 3 consecutive normal Pap tests.  Hepatitis C blood test.** / For any individual with known risks for hepatitis C.  Skin self-exam. / Monthly.  Influenza vaccine. / Every year.  Tetanus, diphtheria, and acellular pertussis (Tdap, Td) vaccine.** / Consult your health care provider. Pregnant women should receive 1 dose of Tdap vaccine during each pregnancy. 1 dose of Td every 10 years.  Varicella vaccine.** / Consult your health care provider. Pregnant females who do not have evidence of immunity should receive the first dose after pregnancy.  HPV vaccine. / 3 doses over 6 months, if 72 and younger. The vaccine is not recommended for use in pregnant females. However, pregnancy testing is not needed before receiving a dose.  Measles, mumps, rubella (MMR) vaccine.** / You need at least 1 dose of MMR if you were born in 1957 or later. You may also need a 2nd dose. For females of childbearing age, rubella immunity should be determined. If there is no evidence of immunity, females who are not pregnant should be vaccinated. If there is no evidence of immunity, females who are pregnant should delay immunization until after pregnancy.  Pneumococcal 13-valent conjugate (PCV13) vaccine.** / Consult your health care provider.  Pneumococcal polysaccharide (PPSV23) vaccine.** / 1 to 2 doses if you smoke cigarettes or if you have certain conditions.  Meningococcal vaccine.** /  1 dose if you are age 19 to 21 years and a first-year college  student living in a residence hall, or have one of several medical conditions, you need to get vaccinated against meningococcal disease. You may also need additional booster doses.  Hepatitis A vaccine.** / Consult your health care provider.  Hepatitis B vaccine.** / Consult your health care provider.  Haemophilus influenzae type b (Hib) vaccine.** / Consult your health care provider. Ages 40 to 64 years  Blood pressure check.** / Every 1 to 2 years.  Lipid and cholesterol check.** / Every 5 years beginning at age 20 years.  Lung cancer screening. / Every year if you are aged 55-80 years and have a 30-pack-year history of smoking and currently smoke or have quit within the past 15 years. Yearly screening is stopped once you have quit smoking for at least 15 years or develop a health problem that would prevent you from having lung cancer treatment.  Clinical breast exam.** / Every year after age 40 years.  BRCA-related cancer risk assessment.** / For women who have family members with a BRCA-related cancer (breast, ovarian, tubal, or peritoneal cancers).  Mammogram.** / Every year beginning at age 40 years and continuing for as long as you are in good health. Consult with your health care provider.  Pap test.** / Every 3 years starting at age 30 years through age 65 or 70 years with a history of 3 consecutive normal Pap tests.  HPV screening.** / Every 3 years from ages 30 years through ages 65 to 70 years with a history of 3 consecutive normal Pap tests.  Fecal occult blood test (FOBT) of stool. / Every year beginning at age 50 years and continuing until age 75 years. You may not need to do this test if you get a colonoscopy every 10 years.  Flexible sigmoidoscopy or colonoscopy.** / Every 5 years for a flexible sigmoidoscopy or every 10 years for a colonoscopy beginning at age 50 years and continuing until age 75 years.  Hepatitis C blood test.** / For all people born from 1945 through  1965 and any individual with known risks for hepatitis C.  Skin self-exam. / Monthly.  Influenza vaccine. / Every year.  Tetanus, diphtheria, and acellular pertussis (Tdap/Td) vaccine.** / Consult your health care provider. Pregnant women should receive 1 dose of Tdap vaccine during each pregnancy. 1 dose of Td every 10 years.  Varicella vaccine.** / Consult your health care provider. Pregnant females who do not have evidence of immunity should receive the first dose after pregnancy.  Zoster vaccine.** / 1 dose for adults aged 60 years or older.  Measles, mumps, rubella (MMR) vaccine.** / You need at least 1 dose of MMR if you were born in 1957 or later. You may also need a 2nd dose. For females of childbearing age, rubella immunity should be determined. If there is no evidence of immunity, females who are not pregnant should be vaccinated. If there is no evidence of immunity, females who are pregnant should delay immunization until after pregnancy.  Pneumococcal 13-valent conjugate (PCV13) vaccine.** / Consult your health care provider.  Pneumococcal polysaccharide (PPSV23) vaccine.** / 1 to 2 doses if you smoke cigarettes or if you have certain conditions.  Meningococcal vaccine.** / Consult your health care provider.  Hepatitis A vaccine.** / Consult your health care provider.  Hepatitis B vaccine.** / Consult your health care provider.  Haemophilus influenzae type b (Hib) vaccine.** / Consult your health care provider. Ages 65   years and over  Blood pressure check.** / Every 1 to 2 years.  Lipid and cholesterol check.** / Every 5 years beginning at age 28 years.  Lung cancer screening. / Every year if you are aged 62-80 years and have a 30-pack-year history of smoking and currently smoke or have quit within the past 15 years. Yearly screening is stopped once you have quit smoking for at least 15 years or develop a health problem that would prevent you from having lung cancer  treatment.  Clinical breast exam.** / Every year after age 2 years.  BRCA-related cancer risk assessment.** / For women who have family members with a BRCA-related cancer (breast, ovarian, tubal, or peritoneal cancers).  Mammogram.** / Every year beginning at age 73 years and continuing for as long as you are in good health. Consult with your health care provider.  Pap test.** / Every 3 years starting at age 23 years through age 89 or 52 years with 3 consecutive normal Pap tests. Testing can be stopped between 65 and 70 years with 3 consecutive normal Pap tests and no abnormal Pap or HPV tests in the past 10 years.  HPV screening.** / Every 3 years from ages 58 years through ages 9 or 36 years with a history of 3 consecutive normal Pap tests. Testing can be stopped between 65 and 70 years with 3 consecutive normal Pap tests and no abnormal Pap or HPV tests in the past 10 years.  Fecal occult blood test (FOBT) of stool. / Every year beginning at age 63 years and continuing until age 80 years. You may not need to do this test if you get a colonoscopy every 10 years.  Flexible sigmoidoscopy or colonoscopy.** / Every 5 years for a flexible sigmoidoscopy or every 10 years for a colonoscopy beginning at age 48 years and continuing until age 1 years.  Hepatitis C blood test.** / For all people born from [birth year] through 1964 and any individual with known risks for hepatitis C.  Osteoporosis screening.** / A one-time screening for women ages 87 years and over and women at risk for fractures or osteoporosis.  Skin self-exam. / Monthly.  Influenza vaccine. / Every year.  Tetanus, diphtheria, and acellular pertussis (Tdap/Td) vaccine.** / 1 dose of Td every 10 years.  Varicella vaccine.** / Consult your health care provider.  Zoster vaccine.** / 1 dose for adults aged 36 years or older.  Pneumococcal 13-valent conjugate (PCV13) vaccine.** / Consult your health care provider.  Pneumococcal  polysaccharide (PPSV23) vaccine.** / 1 dose for all adults aged 2 years and older.  Meningococcal vaccine.** / Consult your health care provider.  Hepatitis A vaccine.** / Consult your health care provider.  Hepatitis B vaccine.** / Consult your health care provider.  Haemophilus influenzae type b (Hib) vaccine.** / Consult your health care provider. ** Family history and personal history of risk and conditions may change your health care provider's recommendations. Document Released: 11/21/2001 Document Revised: 02/09/2014 Document Reviewed: 02/20/2011 [facility] Patient Information 2015 [place], [place]. This information is not intended to replace advice given to you by your health care provider. Make sure you discuss any questions you have with your health care provider.

## 2014-06-05 LAB — HIV ANTIBODY (ROUTINE TESTING W REFLEX): HIV 1&2 Ab, 4th Generation: NONREACTIVE

## 2014-06-05 LAB — CYTOLOGY - PAP

## 2014-06-05 LAB — WET PREP, GENITAL
CLUE CELLS WET PREP: NONE SEEN
Trich, Wet Prep: NONE SEEN
YEAST WET PREP: NONE SEEN

## 2014-06-05 LAB — RPR

## 2014-06-05 LAB — HEPATITIS C ANTIBODY: HCV Ab: REACTIVE — AB

## 2014-06-05 LAB — HEPATITIS B SURFACE ANTIGEN: HEP B S AG: NEGATIVE

## 2014-06-09 ENCOUNTER — Other Ambulatory Visit (INDEPENDENT_AMBULATORY_CARE_PROVIDER_SITE_OTHER): Payer: Medicaid Other | Admitting: *Deleted

## 2014-06-09 ENCOUNTER — Telehealth: Payer: Self-pay | Admitting: *Deleted

## 2014-06-09 DIAGNOSIS — R768 Other specified abnormal immunological findings in serum: Secondary | ICD-10-CM

## 2014-06-09 DIAGNOSIS — K759 Inflammatory liver disease, unspecified: Secondary | ICD-10-CM

## 2014-06-09 NOTE — Progress Notes (Signed)
Pt is here today for a HCV RNA test to confirm a positive Hep. C result.

## 2014-06-09 NOTE — Telephone Encounter (Signed)
Message copied by [facility] on Tue Jun 09, 2014  2:28 PM ------      Message from: CONSTANT, Vickii Chafe      Created: Tue Jun 09, 2014  8:42 AM       Please inform patient of positive Hepatitis C screen. Patient needs to come in to confirm this result with additional blood work -HCV RNA (please order)      Thanks      Clinical cytogeneticist ------

## 2014-06-09 NOTE — Telephone Encounter (Signed)
Pt aware. Pt coming in today for more blood work.

## 2014-06-12 LAB — HCV RNA QUANT RFLX ULTRA OR GENOTYP: HCV Quantitative: NOT DETECTED IU/mL (ref <15)

## 2014-06-23 ENCOUNTER — Telehealth: Payer: Self-pay | Admitting: *Deleted

## 2014-06-24 NOTE — Telephone Encounter (Signed)
Error in opening chart/tn

## 2014-08-10 ENCOUNTER — Encounter: Payer: Self-pay | Admitting: Obstetrics and Gynecology

## 2014-12-23 ENCOUNTER — Ambulatory Visit (INDEPENDENT_AMBULATORY_CARE_PROVIDER_SITE_OTHER): Payer: Medicaid Other | Admitting: Family Medicine

## 2014-12-23 ENCOUNTER — Encounter: Payer: Self-pay | Admitting: Family Medicine

## 2014-12-23 VITALS — BP 112/69 | HR 97 | Temp 98.9°F | Ht 62.0 in | Wt 252.6 lb

## 2014-12-23 DIAGNOSIS — N644 Mastodynia: Secondary | ICD-10-CM

## 2014-12-23 DIAGNOSIS — R5382 Chronic fatigue, unspecified: Secondary | ICD-10-CM

## 2014-12-23 LAB — CBC
HCT: 37.1 % (ref 36.0–46.0)
Hemoglobin: 12 g/dL (ref 12.0–15.0)
MCH: 24.4 pg — ABNORMAL LOW (ref 26.0–34.0)
MCHC: 32.3 g/dL (ref 30.0–36.0)
MCV: 75.4 fL — ABNORMAL LOW (ref 78.0–100.0)
MPV: 10.3 fL (ref 8.6–12.4)
PLATELETS: 291 10*3/uL (ref 150–400)
RBC: 4.92 MIL/uL (ref 3.87–5.11)
RDW: 15.3 % (ref 11.5–15.5)
WBC: 9.3 10*3/uL (ref 4.0–10.5)

## 2014-12-23 LAB — COMPREHENSIVE METABOLIC PANEL
ALBUMIN: 3.7 g/dL (ref 3.5–5.2)
ALT: 10 U/L (ref 0–35)
AST: 12 U/L (ref 0–37)
Alkaline Phosphatase: 55 U/L (ref 39–117)
BUN: 12 mg/dL (ref 6–23)
CO2: 24 mEq/L (ref 19–32)
CREATININE: 0.7 mg/dL (ref 0.50–1.10)
Calcium: 8.7 mg/dL (ref 8.4–10.5)
Chloride: 103 mEq/L (ref 96–112)
Glucose, Bld: 88 mg/dL (ref 70–99)
Potassium: 4.1 mEq/L (ref 3.5–5.3)
SODIUM: 138 meq/L (ref 135–145)
Total Bilirubin: 0.2 mg/dL (ref 0.2–1.2)
Total Protein: 7.2 g/dL (ref 6.0–8.3)

## 2014-12-23 LAB — TSH: TSH: 1.767 u[IU]/mL (ref 0.350–4.500)

## 2014-12-23 NOTE — Progress Notes (Signed)
  Subjective:     Lynn Wilcox is a 34 y.o. female who presents for evaluation of fatigue. Symptoms began 18 months ago. The patient feels the fatigue began with: exercise intolerance. Symptoms of her fatigue have been change in appetite, diffuse soft tissue aches and pains, feelings of depression and avoidance of going out. Patient describes the following psychological symptoms: depression, stress in the family and stress with significant other. Patient denies change in hair texture, cold intolerance, constipation, excessive menstrual bleeding, GI blood loss, significant change in weight and unusual rashes. Symptoms have progressed to a point and plateaued. Symptom severity: moderate. Previous visits for this problem: none.   Breast Pain: aching, burning pain in left breast that started after having her baby about 15 months ago.  Stopped breast feeding about 10 months ago.  No discharge, no rashes, no lumps.  The following portions of the patient's history were reviewed and updated as appropriate: allergies, current medications, past family history, past medical history, past social history, past surgical history and problem list.  Review of Systems Pertinent items are noted in HPI.    Objective:    BP 112/69 mmHg  Pulse 97  Temp(Src) 98.9 F (37.2 C)  Ht 5\' 2"  (1.575 m)  Wt 252 lb 9.6 oz (114.579 kg)  BMI 46.19 kg/m2  LMP 12/03/2014  General Appearance:    Alert, cooperative, no distress, appears stated age  Head:    Normocephalic, without obvious abnormality, atraumatic  Neck:   Supple, symmetrical, trachea midline, no adenopathy;    thyroid:  no enlargement/tenderness/nodules; no carotid   bruit or JVD  Breast:    No masses, skin rash, pain illicited on exam  Lungs:     Clear to auscultation bilaterally, respirations unlabored   Heart:    Regular rate and rhythm, S1 and S2 normal, no murmur, rub   or gallop  Abdomen:     Soft, non-tender, bowel sounds active all four quadrants,   no masses, no organomegaly  Extremities:   Extremities normal, atraumatic, no cyanosis or edema  Pulses:   2+ and symmetric all extremities  Skin:   Skin color, texture, turgor normal, no rashes or lesions  Lymph nodes:   Cervical, supraclavicular, and axillary nodes normal  Neurologic:   CNII-XII intact, normal strength, sensation and reflexes    throughout      Assessment:    Depression is likely contributing to the problem.    Plan:   Problem List Items Addressed This Visit    None    Visit Diagnoses    Chronic fatigue    -  Primary    Relevant Orders    CBC    Comprehensive metabolic panel    TSH    Hemoglobin A1c    Breast pain          Check TSH, CBC, HgA1c, CMP for chronic fatigue.  I do think that the likely etiology is depression.  Will refer to [facility] or Internal Medicine for further management of this.  No etiology of patient's breast pain.  Return to clinic with any changes.

## 2014-12-23 NOTE — Progress Notes (Signed)
Here to follow up on issues since childbirth such as mood swings, no energy.

## 2014-12-23 NOTE — Patient Instructions (Signed)

## 2014-12-24 LAB — HEMOGLOBIN A1C
Hgb A1c MFr Bld: 5.9 % — ABNORMAL HIGH (ref <5.7)
MEAN PLASMA GLUCOSE: 123 mg/dL — AB (ref <117)

## 2014-12-29 NOTE — Progress Notes (Signed)
Quick Note:  Please let pt know that her labs were normal. She should follow up with family medicine as we discussed. ______

## 2014-12-30 ENCOUNTER — Telehealth: Payer: Self-pay

## 2014-12-30 NOTE — Telephone Encounter (Signed)
-----   Message from Truett Mainland, DO sent at 12/29/2014  7:48 PM EDT ----- Please let pt know that her labs were normal.  She should follow up with family medicine as we discussed.

## 2014-12-30 NOTE — Telephone Encounter (Signed)
Called patient and informed her of results. Patient verbalized understanding and gratitude. No questions or concerns.

## 2015-04-21 IMAGING — US US OB COMP +14 WK
1 series · 12 of 28 positions shown · non-contrast
Comparison: none

[Series 1: us ob comp +14 wk · 12 of 65 slices shown]
[im 3/65]
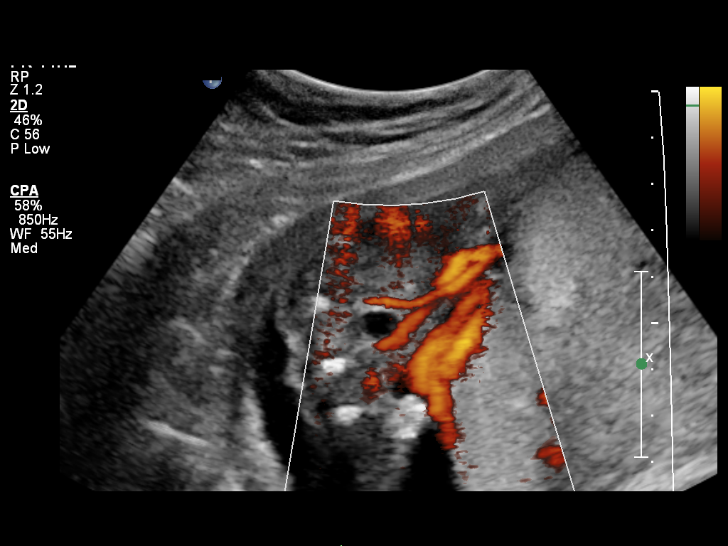
[im 8/65]
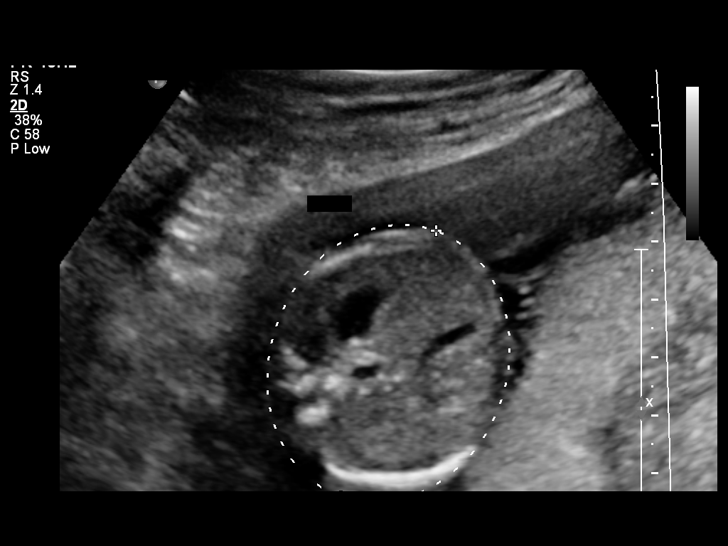
[im 12/65]
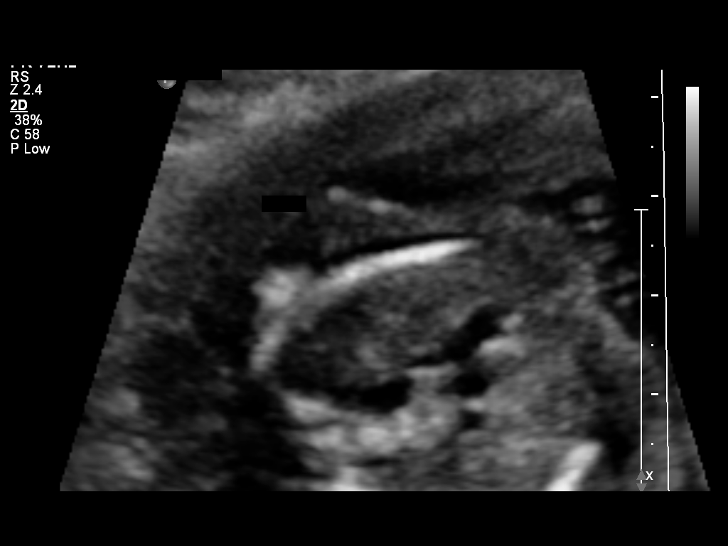
[im 19/65]
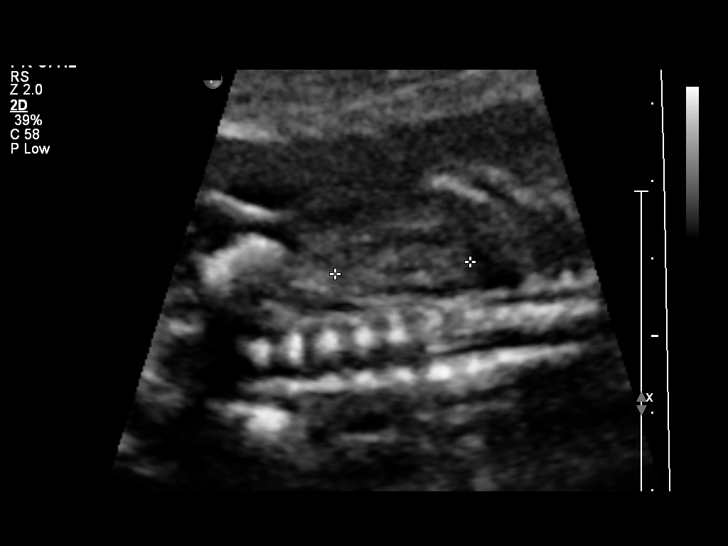
[im 24/65]
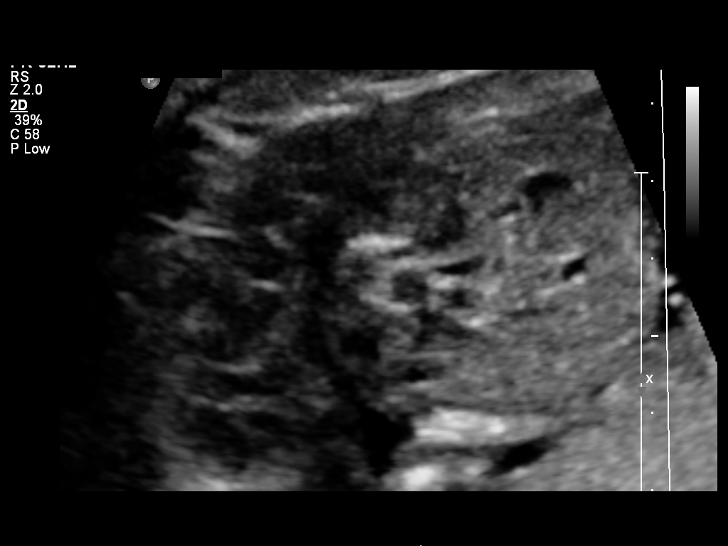
[im 29/65]
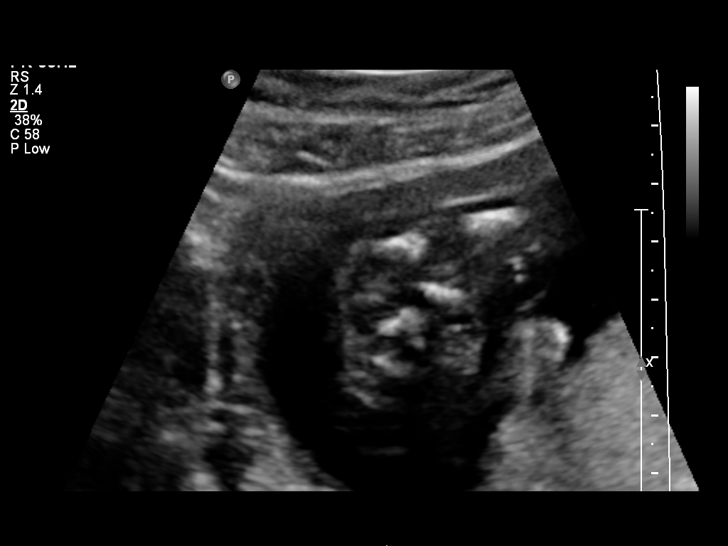
[im 36/65]
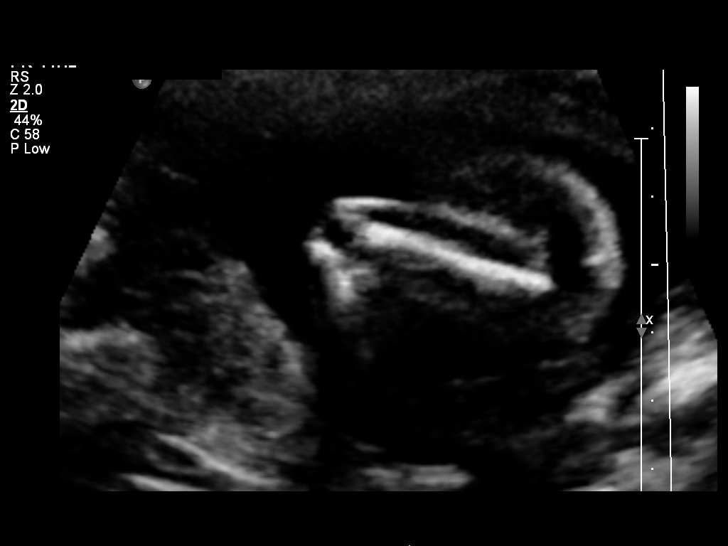
[im 41/65]
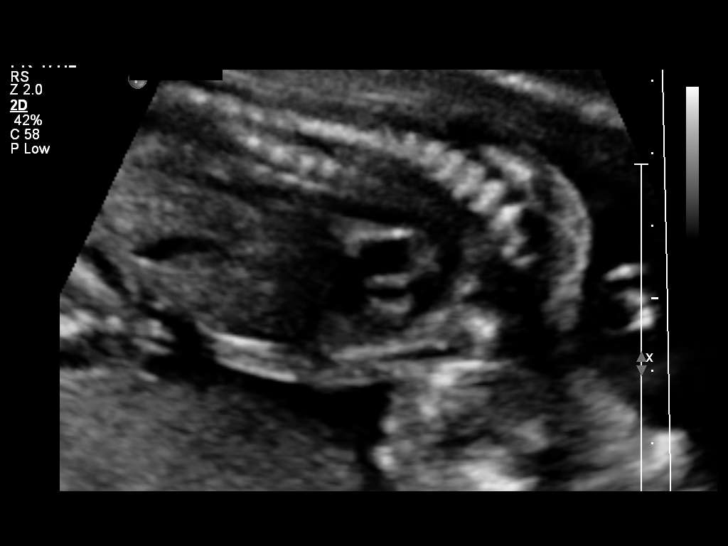
[im 46/65]
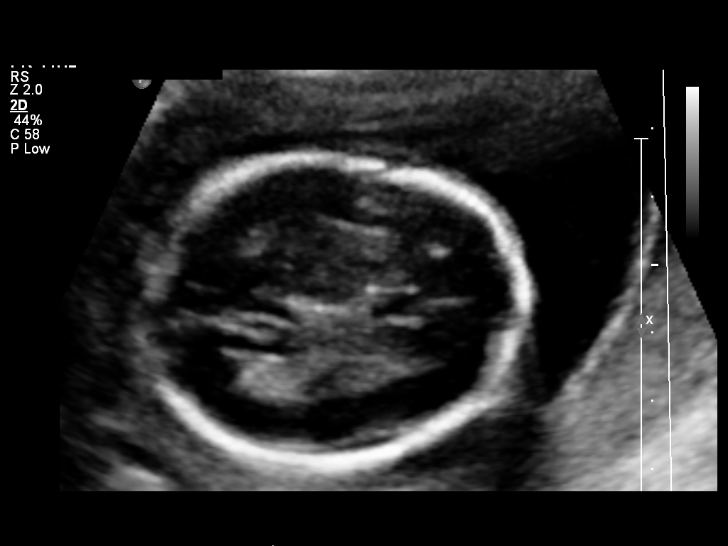
[im 53/65]
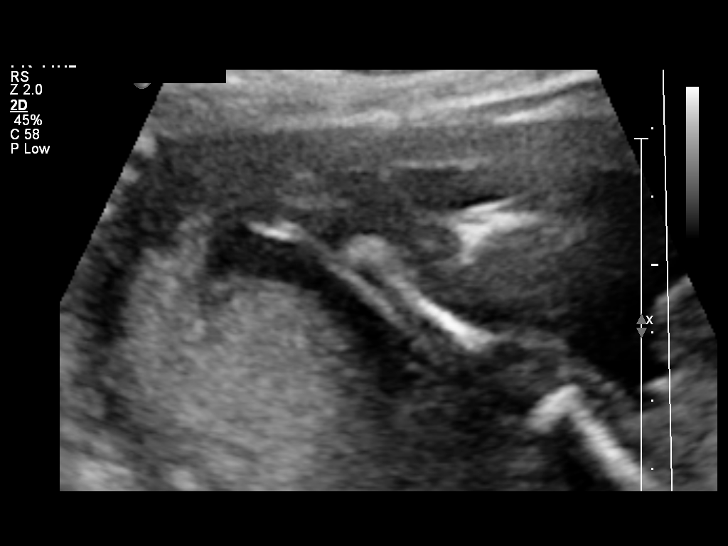
[im 57/65]
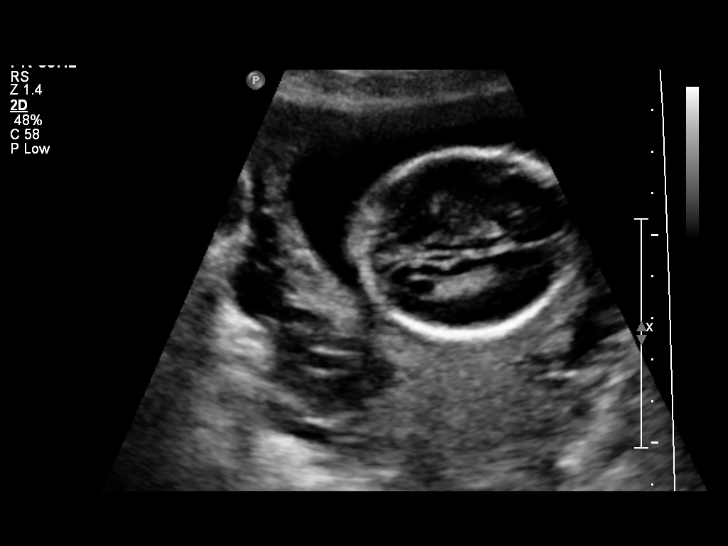
[im 62/65]
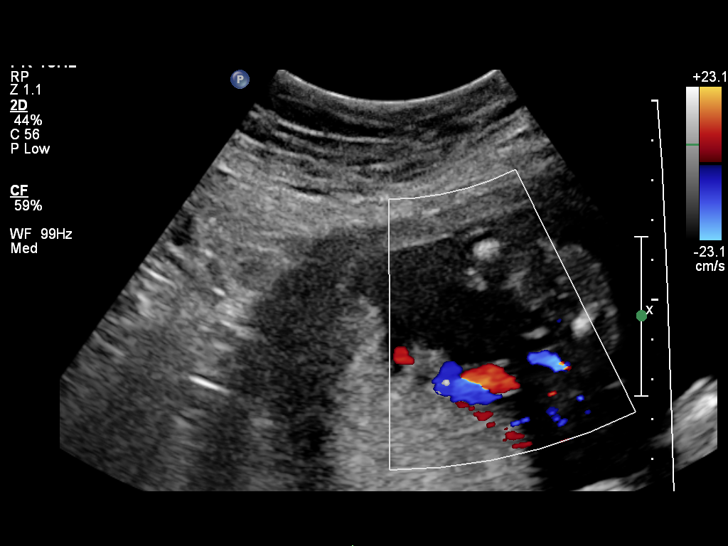

[12 of 28 positions shown; findings below may reference images not displayed]

OBSTETRICS REPORT
                      (Signed Final 04/01/2013 [DATE])

Service(s) Provided

 US OB COMP + 14 WK                                    76805.1
Indications

 Basic anatomic survey
Fetal Evaluation

 Num Of Fetuses:    1
 Preg. Location:    Intrauterine
 Fetal Heart Rate:  152                         bpm
 Cardiac Activity:  Observed
 Presentation:      Cephalic
 Placenta:          Posterior, above cervical
                    os
 P. Cord            Visualized, central
 Insertion:

 Amniotic Fluid
 AFI FV:      Subjectively within normal limits
                                             Larg Pckt:   4.53   cm
 RUQ:   4.53   cm
Biometry

 BPD:     45.3  mm    G. Age:   19w 5d                CI:        70.64   70 - 86
                                                      FL/HC:      17.4   16.1 -

 HC:     171.8  mm    G. Age:   19w 5d       72  %    HC/AC:      1.22   1.09 -

 AC:     140.7  mm    G. Age:   19w 3d       56  %    FL/BPD:
 FL:      29.9  mm    G. Age:   19w 2d       46  %    FL/AC:      21.3   20 - 24
 HUM:     29.2  mm    G. Age:   19w 4d       60  %

 Est. FW:     290  gm    0 lb 10 oz      50  %
Gestational Age

 LMP:           19w 1d       Date:   11/18/12                 EDD:   08/25/13
 U/S Today:     19w 4d                                        EDD:   08/22/13
 Best:          19w 1d    Det. By:   LMP  (11/18/12)          EDD:   08/25/13
Anatomy
 Cranium:          Appears normal         Aortic Arch:      Appears normal
 Fetal Cavum:      Appears normal         Ductal Arch:      Appears normal
 Ventricles:       Appears normal         Diaphragm:        Appears normal
 Choroid Plexus:   Appears normal         Stomach:          Appears normal
 Cerebellum:       Not well visualized    Abdomen:          Appears normal
 Posterior Fossa:  Not well visualized    Abdominal Wall:   Appears nml (cord
                                                            insert, abd wall)
 Nuchal Fold:      Not well visualized    Cord Vessels:     Appears normal (3
                                                            vessel cord)
 Face:             Appears normal         Kidneys:          Appear normal
                   (orbits and profile)
 Lips:             Appears normal         Bladder:          Appears normal
 Heart:            Appears normal         Spine:            Appears normal
                   (4CH, axis, and
                   situs)
 RVOT:             Appears normal         Lower             Appears normal
                                          Extremities:
 LVOT:             Appears normal         Upper             Appears normal
                                          Extremities:
Cervix Uterus Adnexa

 Cervical Length:   3.46      cm

 Cervix:       Normal appearance by transabdominal scan.
 Uterus:       No abnormality visualized.
 Cul De Sac:   No free fluid seen.

 Adnexa:     No abnormality visualized.
Impression

 Single intrauterine gestation demonstrating an estimated
 gestational age by ultrasound of 19w 4d. This is correlated
 with expected estimated gestational age by LMP of 19w 1d.

 Visualized fetal anatomy appears normal. The posterior fossa
 and cerebellum could not be well visualized today due to
 position. No focal placental abnormalities are seen.

 Subjectively and quantitatively normal amniotic fluid volume.

 Normal cervical length and appearance.

 questions or concerns.

## 2017-04-30 ENCOUNTER — Encounter (HOSPITAL_COMMUNITY): Payer: Self-pay | Admitting: Emergency Medicine

## 2017-04-30 ENCOUNTER — Emergency Department (HOSPITAL_COMMUNITY)
Admission: EM | Admit: 2017-04-30 | Discharge: 2017-04-30 | Disposition: A | Discharge status: Home / Self Care | Payer: Medicaid Other | Attending: Emergency Medicine | Admitting: Emergency Medicine

## 2017-04-30 DIAGNOSIS — J45909 Unspecified asthma, uncomplicated: Secondary | ICD-10-CM | POA: Diagnosis not present

## 2017-04-30 DIAGNOSIS — R0981 Nasal congestion: Secondary | ICD-10-CM | POA: Diagnosis present

## 2017-04-30 DIAGNOSIS — J069 Acute upper respiratory infection, unspecified: Secondary | ICD-10-CM | POA: Diagnosis not present

## 2017-04-30 MED ORDER — CETIRIZINE HCL 10 MG PO TABS: 10.0000 mg | ORAL_TABLET | Freq: Every day | ORAL | 1 refills | Status: DC

## 2017-04-30 MED ORDER — FLUTICASONE PROPIONATE 50 MCG/ACT NA SUSP: 1.0000 | Freq: Every day | NASAL | 2 refills | Status: DC

## 2017-04-30 NOTE — ED Provider Notes (Signed)
[facility] DEPT Provider Note   CSN: [id number] Arrival date & time: 04/30/17  0109     History   Chief Complaint Chief Complaint  Patient presents with  . Nasal Congestion    HPI Lynn Wilcox is a 36 y.o. female.  Patient presents to the emergency department with chief complaint of nasal and sinus congestion. She states the symptoms started 2 days ago. They worsened today. She reports associated ear fullness and dry cough. She denies any fevers, chills, nausea, or vomiting. She has not taken anything for symptoms. There are no other associated symptoms or modifying factors.   The history is provided by the patient. No language interpreter was used.    Past Medical History:  Diagnosis Date  . Asthma    Uses inhaler prn  . Obesity   . Seasonal allergies     Patient Active Problem List   Diagnosis Date Noted  . Bilateral carpal tunnel syndrome 09/25/2013  . History of asthma 05/13/2013  . GERD (gastroesophageal reflux disease) 04/15/2013    Past Surgical History:  Procedure Laterality Date  . BREAST BIOPSY     age 70  . WISDOM TOOTH EXTRACTION      OB History    Gravida Para Term Preterm AB Living   2 2 2     2    SAB TAB Ectopic Multiple Live Births           2       Home Medications    Prior to Admission medications   Medication Sig Start Date End Date Taking? Authorizing Provider  albuterol (PROVENTIL HFA;VENTOLIN HFA) 108 (90 BASE) MCG/ACT inhaler Inhale 2 puffs into the lungs every 6 (six) hours as needed for wheezing. 05/13/13   Woodroe Mode, MD  cetirizine (ZYRTEC ALLERGY) 10 MG tablet Take 1 tablet (10 mg total) by mouth daily. 04/30/17   Montine Circle, PA-C  fluticasone (FLONASE) 50 MCG/ACT nasal spray Place 1 spray into both nostrils daily. 04/30/17   Montine Circle, PA-C  ibuprofen (ADVIL,MOTRIN) 600 MG tablet Take 1 tablet (600 mg total) by mouth every 6 (six) hours as needed for mild pain, moderate pain or cramping. 08/24/13   Roma Schanz, CNM    Family History Family History  Problem Relation Age of Onset  . Stroke Mother   . Hypertension Mother   . Hyperlipidemia Mother     Social History Social History  Substance Use Topics  . Smoking status: Never Smoker  . Smokeless tobacco: Never Used  . 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  HENT: Positive for postnasal drip, rhinorrhea, sinus pressure, sneezing and sore throat.   Respiratory: Positive for cough. Negative for shortness of breath.   Cardiovascular: Negative for chest pain.  Gastrointestinal: Negative for abdominal pain, constipation, diarrhea, nausea and vomiting.  Genitourinary: Negative for dysuria.     Physical Exam Updated Vital Signs BP 126/89 (BP Location: Right Arm)   Pulse (!) 101   Temp 98.4 F (36.9 C) (Oral)   Resp 18   SpO2 100%   Physical Exam Physical Exam  Constitutional: Pt  is oriented to person, place, and time. Appears well-developed and well-nourished. No distress.  HENT:  Head: Normocephalic and atraumatic.  Right Ear: Tympanic membrane, external ear and ear canal normal.  Left Ear: Tympanic membrane, external  ear and ear canal normal.  Nose: Mucosal edema and moderate rhinorrhea present. No epistaxis. Right sinus exhibits no maxillary sinus tenderness and no frontal sinus tenderness. Left sinus exhibits no maxillary sinus tenderness and no frontal sinus tenderness.  Mouth/Throat: Uvula is midline and mucous membranes are normal. Mucous membranes are not pale and not cyanotic. No oropharyngeal exudate, posterior oropharyngeal edema, posterior oropharyngeal erythema or tonsillar abscesses.  Eyes: Conjunctivae are normal. Pupils are equal, round, and reactive to light.  Neck: Normal range of motion and full passive range of motion without  pain.  Cardiovascular: Normal rate and intact distal pulses.   Pulmonary/Chest: Effort normal and breath sounds normal. No stridor.  Clear and equal breath sounds without focal wheezes, rhonchi, rales  Abdominal: Soft. Bowel sounds are normal. There is no tenderness.  Musculoskeletal: Normal range of motion.  Lymphadenopathy:    Pthas no cervical adenopathy.  Neurological: Pt is alert and oriented to person, place, and time.  Skin: Skin is warm and dry. No rash noted. Pt is not diaphoretic.  Psychiatric: Normal mood and affect.  Nursing note and vitals reviewed.    ED Treatments / Results  Labs (all labs ordered are listed, but only abnormal results are displayed) Labs Reviewed - No data to display  EKG  EKG Interpretation None       Radiology No results found.  Procedures Procedures (including critical care time)  Medications Ordered in ED Medications - No data to display   Initial Impression / Assessment and Plan / ED Course  I have reviewed the triage vital signs and the nursing notes.  Pertinent labs & imaging results that were available during my care of the patient were reviewed by me and considered in my medical decision making (see chart for details).     Patients symptoms are consistent with URI, likely viral etiology. Discussed that antibiotics are not indicated for viral infections. Pt will be discharged with symptomatic treatment.  Verbalizes understanding and is agreeable with plan. Pt is hemodynamically stable & in NAD prior to dc.   Final Clinical Impressions(s) / ED Diagnoses   Final diagnoses:  Upper respiratory tract infection, unspecified type    New Prescriptions New Prescriptions   CETIRIZINE (ZYRTEC ALLERGY) 10 MG TABLET    Take 1 tablet (10 mg total) by mouth daily.   FLUTICASONE (FLONASE) 50 MCG/ACT NASAL SPRAY    Place 1 spray into both nostrils daily.     Montine Circle, PA-C 04/30/17 0211    Ward, Delice Bison, DO 04/30/17  [phone]

## 2017-04-30 NOTE — ED Triage Notes (Signed)
Pt presents to ED for assessment of 3 days of "the worst nasal congestion I've ever had".  Pt c/o intermittent headache and mild cough.

## 2017-04-30 NOTE — ED Notes (Signed)
PA at bedside.

## 2017-07-18 ENCOUNTER — Ambulatory Visit (INDEPENDENT_AMBULATORY_CARE_PROVIDER_SITE_OTHER): Payer: Medicaid Other | Admitting: Family Medicine

## 2017-07-18 ENCOUNTER — Encounter: Payer: Self-pay | Admitting: Family Medicine

## 2017-07-18 VITALS — BP 108/60 | HR 85 | Temp 98.5°F | Ht 62.0 in | Wt 256.0 lb

## 2017-07-18 DIAGNOSIS — Z0000 Encounter for general adult medical examination without abnormal findings: Secondary | ICD-10-CM | POA: Diagnosis not present

## 2017-07-18 DIAGNOSIS — M25511 Pain in right shoulder: Secondary | ICD-10-CM | POA: Insufficient documentation

## 2017-07-18 NOTE — Assessment & Plan Note (Addendum)
Was told she had "spongy gums" at dentist and could be indicative of chronic disease - wants lab work done. Reviewed PMH and medications. Exam wnl. Seasonal allergies and asthma well controlled. - CBC, CMP, Lipid panel

## 2017-07-18 NOTE — Addendum Note (Signed)
Addended by: Francene Castle on: 07/18/2017 05:11 PM   Modules accepted: Orders

## 2017-07-18 NOTE — Progress Notes (Signed)
   Subjective:   Patient ID: Lynn Wilcox    DOB: [birth year], 36 y.o. female   MRN: [id number]  Lynn Wilcox is a 36 y.o. female with a history of asthma, allergies here for   Establish care - Patient went to the dentist for annual cleaning recently and was told she had "spongy gums" and could be indicative of chronic disease. Wants lab work done. 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 Feels safe at home but partner cheated on her right after son was born. States she has a good support system in place and plan to leave.  Intermittent shoulder pain - Feels most after lifting heavy objects, moving things around at home (is a stay at home mom) - Will lay down and gets some relief. Also gets in massage chair at home.  - Smokes pot when can't lay down or get in massage chair. Hasn't tried any other medication for pain. - Also noted to have some pleuritic chest pain off/on, marijuana relieves pain.   Healthcare Maintenance - Vaccines: flu - declined - Pap Smear: 05/2014, negative. Due 2018. - Former smoker  Review of Systems:  Per HPI.   [place]: reviewed. Smoking status reviewed. Medications reviewed.  Objective:   BP 108/60   Pulse 85   Temp 98.5 F (36.9 C) (Oral)   Ht 5\' 2"  (1.575 m)   Wt 256 lb (116.1 kg)   LMP 06/18/2017 (Approximate)   SpO2 99%   BMI 46.82 kg/m  Vitals and nursing note reviewed.  General: obese female, in no acute distress with non-toxic appearance HEENT: normocephalic, atraumatic, moist mucous membranes Neck: supple, non-tender without lymphadenopathy CV: regular rate and rhythm without murmurs, rubs, or gallops Lungs: clear to auscultation bilaterally with normal work of  breathing Skin: warm, dry, no rashes or lesions Extremities: warm and well perfused, normal tone MSK: Full ROM, strength intact, gait normal Neuro: Alert and oriented, speech normal  Assessment & Plan:   Encounter for preventive care Was told she had "spongy gums" at dentist and could be indicative of chronic disease - wants lab work done. Reviewed PMH and medications. Exam wnl. Seasonal allergies and asthma well controlled. - CBC, CMP, Lipid panel  Right shoulder pain Symptoms intermittent in nature, exacerbated by heavy lifting. Seems to be muscular in nature. No pain today. States she uses marijuana for pain control. Full ROM intact, strength 5/5 UE bilaterally. - Counseled to use ibuprofen for pain. If pain is not relieved by ibuprofen or becomes unable to complete daily activities, should be reevaluated. Can consider xray at that time.  Orders Placed This Encounter  Procedures  . CBC  . Comprehensive metabolic panel  . Lipid Panel    Standing Status:   Future    Standing Expiration Date:   07/18/2018   No orders of the defined types were placed in this encounter.   Rory Percy, DO PGY-1, [facility] 07/18/2017 5:06 PM

## 2017-07-18 NOTE — Patient Instructions (Signed)
It was great to see you!  For your shoulder pain and pain with deep breathing,  - You can try ibuprofen for pain as needed - Continue using heating pad and massage chair when needed.  Continue using Zyrtec and Flonase for allergies.  Please schedule an appointment for gynecological care at your convenience.  We are checking some labs today, we will call you or send you a letter if they are abnormal.  We are also ordering a cholesterol panel, you can come back to the lab when you are fasting (no food or drink other than water for 8 hours before the test). Typically patient's like to get this lab work in the morning before breakfast.  Take care and seek immediate care sooner if you develop any concerns.   Lynn Percy, DO [facility]

## 2017-07-18 NOTE — Assessment & Plan Note (Signed)
Symptoms intermittent in nature, exacerbated by heavy lifting. Seems to be muscular in nature. No pain today. States she uses marijuana for pain control. Full ROM intact, strength 5/5 UE bilaterally. - Counseled to use ibuprofen for pain. If pain is not relieved by ibuprofen or becomes unable to complete daily activities, should be reevaluated. Can consider xray at that time.

## 2017-07-19 LAB — COMPREHENSIVE METABOLIC PANEL
ALBUMIN: 3.9 g/dL (ref 3.5–5.5)
ALT: 11 IU/L (ref 0–32)
AST: 12 IU/L (ref 0–40)
Albumin/Globulin Ratio: 1.1 — ABNORMAL LOW (ref 1.2–2.2)
Alkaline Phosphatase: 64 IU/L (ref 39–117)
BUN / CREAT RATIO: 15 (ref 9–23)
BUN: 12 mg/dL (ref 6–20)
Bilirubin Total: <0.2 mg/dL (ref 0.0–1.2)
CALCIUM: 9.4 mg/dL (ref 8.7–10.2)
CO2: 23 mmol/L (ref 20–29)
Chloride: 101 mmol/L (ref 96–106)
Creatinine, Ser: 0.78 mg/dL (ref 0.57–1.00)
GFR, EST AFRICAN AMERICAN: 115 mL/min/{1.73_m2} (ref >59)
GFR, EST NON AFRICAN AMERICAN: 99 mL/min/{1.73_m2} (ref >59)
Globulin, Total: 3.5 g/dL (ref 1.5–4.5)
Glucose: 85 mg/dL (ref 65–99)
Potassium: 4.4 mmol/L (ref 3.5–5.2)
Sodium: 138 mmol/L (ref 134–144)
TOTAL PROTEIN: 7.4 g/dL (ref 6.0–8.5)

## 2017-07-19 LAB — LIPID PANEL
CHOL/HDL RATIO: 3.6 ratio (ref 0.0–4.4)
Cholesterol, Total: 142 mg/dL (ref 100–199)
HDL: 40 mg/dL (ref >39)
LDL Calculated: 88 mg/dL (ref 0–99)
Triglycerides: 71 mg/dL (ref 0–149)
VLDL Cholesterol Cal: 14 mg/dL (ref 5–40)

## 2017-07-19 LAB — CBC
HEMATOCRIT: 35 % (ref 34.0–46.6)
HEMOGLOBIN: 11.3 g/dL (ref 11.1–15.9)
MCH: 24.1 pg — AB (ref 26.6–33.0)
MCHC: 32.3 g/dL (ref 31.5–35.7)
MCV: 75 fL — ABNORMAL LOW (ref 79–97)
Platelets: 301 10*3/uL (ref 150–379)
RBC: 4.68 x10E6/uL (ref 3.77–5.28)
RDW: 15.6 % — ABNORMAL HIGH (ref 12.3–15.4)
WBC: 8.5 10*3/uL (ref 3.4–10.8)

## 2017-12-31 NOTE — Progress Notes (Deleted)
   Subjective:   Patient ID: Lynn Wilcox    DOB: [birth year], 37 y.o. female   MRN: [id number]  Marveline Wilcox is a 37 y.o. female with a history of GERD, carpal tunnel, asthma here for   *** - at last visit, expressed she wanted to leave current partner as he cheated on her after she had her son. Felt safe at home. Stay at home mom. - due for PAP  Review of Systems:  Per HPI.   [place]: reviewed. Smoking status reviewed. Medications reviewed.  Objective:   There were no vitals taken for this visit. Vitals and nursing note reviewed.  General: well nourished, well developed, in no acute distress with non-toxic appearance HEENT: normocephalic, atraumatic, moist mucous membranes Neck: supple, non-tender without lymphadenopathy CV: regular rate and rhythm without murmurs, rubs, or gallops, no lower extremity edema Lungs: clear to auscultation bilaterally with normal work of breathing Abdomen: soft, non-tender, non-distended, no masses or organomegaly palpable, normoactive bowel sounds Skin: warm, dry, no rashes or lesions Extremities: warm and well perfused, normal tone MSK: ROM grossly intact, strength intact, gait normal Neuro: Alert and oriented, speech normal  Assessment & Plan:   No problem-specific Assessment & Plan notes found for this encounter.  No orders of the defined types were placed in this encounter.  No orders of the defined types were placed in this encounter.   Rory Percy, DO PGY-1, [facility] 12/31/2017 8:53 PM

## 2018-01-02 ENCOUNTER — Ambulatory Visit: Payer: Medicaid Other | Admitting: Family Medicine

## 2018-05-18 NOTE — Progress Notes (Deleted)
  Subjective:   Patient ID: Charlesia Canaday    DOB: [birth year], 37 y.o. female   MRN: [id number]  Debera Sterba is a 37 y.o. female with a history of GERD, asthma here for   Lump on arm - ***  Review of Systems:  Per HPI.   [place], medications and smoking status reviewed.  Objective:   There were no vitals taken for this visit. Vitals and nursing note reviewed.  General: well nourished, well developed, in no acute distress with non-toxic appearance HEENT: normocephalic, atraumatic, moist mucous membranes Neck: supple, non-tender without lymphadenopathy CV: regular rate and rhythm without murmurs, rubs, or gallops, no lower extremity edema Lungs: clear to auscultation bilaterally with normal work of breathing Abdomen: soft, non-tender, non-distended, no masses or organomegaly palpable, normoactive bowel sounds Skin: warm, dry, no rashes or lesions Extremities: warm and well perfused, normal tone MSK: ROM grossly intact, strength intact, gait normal Neuro: Alert and oriented, speech normal  Assessment & Plan:   No problem-specific Assessment & Plan notes found for this encounter.  No orders of the defined types were placed in this encounter.  No orders of the defined types were placed in this encounter.   Rory Percy, DO PGY-2, [facility] 05/18/2018 10:40 AM

## 2018-05-20 ENCOUNTER — Ambulatory Visit: Payer: Medicaid Other | Admitting: Family Medicine

## 2018-06-18 ENCOUNTER — Encounter: Payer: Self-pay | Admitting: Family Medicine

## 2018-06-18 ENCOUNTER — Ambulatory Visit (INDEPENDENT_AMBULATORY_CARE_PROVIDER_SITE_OTHER): Payer: Medicaid Other | Admitting: Family Medicine

## 2018-06-18 ENCOUNTER — Other Ambulatory Visit: Payer: Self-pay

## 2018-06-18 VITALS — BP 130/82 | HR 81 | Temp 98.5°F | Wt 254.0 lb

## 2018-06-18 DIAGNOSIS — D1722 Benign lipomatous neoplasm of skin and subcutaneous tissue of left arm: Secondary | ICD-10-CM | POA: Diagnosis present

## 2018-06-18 NOTE — Progress Notes (Signed)
   Subjective   Patient ID: Lynn Wilcox    DOB: 09-26-1981, 37 y.o. female   MRN: [id number]  CC: "Lump on my arm"  HPI: Lynn Wilcox is a 37 y.o. female who presents for a same day appointment for the following:  Lump: Patient is presented today with concerns for lump on left forearm which she appreciated approximately 1 year ago while getting out of the shower.  She has been putting off up until now because she is noticed that it has grown over the past several months.  She denies any associated pain or other lumps.  She wanted to make sure it was unrelated to her history of breast lump which was biopsied and found to be noncancerous.  She denies any weakness in her arms.  She is right-handed and denies any trauma to the arm.  ROS: see HPI for pertinent.  [place]: Bilateral carpal tunnel disease, asthma, GERD, chronic shoulder pain.  Surgical history breast biopsy, wisdom tooth extraction.  Family history HTN, HLD, stroke.  Smoking status reviewed. Medications reviewed.  Objective   BP 130/82   Pulse 81   Temp 98.5 F (36.9 C) (Oral)   Wt 254 lb (115.2 kg)   SpO2 98%   BMI 46.46 kg/m  Vitals and nursing note reviewed.  General: well nourished, well developed, NAD with non-toxic appearance HEENT: normocephalic, atraumatic, moist mucous membranes Neck: supple, non-tender without lymphadenopathy Cardiovascular: regular rate and rhythm without murmurs, rubs, or gallops Lungs: clear to auscultation bilaterally with normal work of breathing Skin: warm, dry, no rashes or lesions, cap refill < 2 seconds Extremities: warm and well perfused, normal tone, no edema, 5/5 motor strength in all 4 extremities, there is a palpable localized nodule on the anterior surface of her left forearm which is mobile and soft and nontender, nodule does not move with left wrist range of motion, there is no overlying bruit on auscultation or palpable tracking  Assessment & Plan   Lipoma of left  forearm Chronic.  Localized to superficial surface of anterior left forearm.  Mobile and nontender making lipoma most likely.  - Discussed surgical options which would likely be cosmetic - Advised patient to return if lipoma changes or if she is interested in pursuing surgical options - Scheduled appointment with PCP on 07/26/2018  No orders of the defined types were placed in this encounter.  No orders of the defined types were placed in this encounter.   Harriet Butte, [place], PGY-3 06/18/2018, 5:06 PM

## 2018-06-18 NOTE — Patient Instructions (Signed)
Thank you for coming in to see [name] today. Please see below to review our plan for today's visit.  The lump on your left arm is very consistent with a lipoma.  This is an accumulation of fat tissue.  Typically these do not grow much and not cancerous.  If there are any changes to it, please let us know.  We can always refer you to a surgeon to have it evaluated.    Please call the clinic at [phone] if your symptoms worsen or you have any concerns. It was our pleasure to serve you.  Harriet Butte, [place], PGY-3

## 2018-06-18 NOTE — Assessment & Plan Note (Signed)
Chronic.  Localized to superficial surface of anterior left forearm.  Mobile and nontender making lipoma most likely.  - Discussed surgical options which would likely be cosmetic - Advised patient to return if lipoma changes or if she is interested in pursuing surgical options - Scheduled appointment with PCP on 07/26/2018
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
## 2018-07-26 ENCOUNTER — Ambulatory Visit: Payer: Medicaid Other | Admitting: Family Medicine

## 2018-07-26 ENCOUNTER — Other Ambulatory Visit: Payer: Self-pay

## 2018-07-26 ENCOUNTER — Encounter: Payer: Self-pay | Admitting: Family Medicine

## 2018-07-26 VITALS — BP 110/72 | HR 81 | Temp 98.4°F | Wt 252.0 lb

## 2018-07-26 DIAGNOSIS — M25572 Pain in left ankle and joints of left foot: Secondary | ICD-10-CM | POA: Insufficient documentation

## 2018-07-26 DIAGNOSIS — G8929 Other chronic pain: Secondary | ICD-10-CM | POA: Diagnosis not present

## 2018-07-26 DIAGNOSIS — M25511 Pain in right shoulder: Secondary | ICD-10-CM | POA: Diagnosis present

## 2018-07-26 NOTE — Assessment & Plan Note (Addendum)
Symptoms consistent with ligamentous strain on 5th metatarsal without evidence for fracture given no tenderness to palpation on exam and ability to bear weight. Advised supportive care with proper fitting shoes with inserts and weight loss. Can continue Aleve as needed.

## 2018-07-26 NOTE — Assessment & Plan Note (Signed)
Chronic in nature with repeated intermittent pain. Given location and history of pain as well as nature of work, likely repeated injuries to rotator cuff. No loss of ROM on exam today to suggest ligament tear. Advised shoulder exercises to strengthen muscles, handout provided. Encouraged continued supportive care with Aleve, heating pad, and massage chair.

## 2018-11-06 ENCOUNTER — Other Ambulatory Visit: Payer: Self-pay

## 2018-11-06 ENCOUNTER — Ambulatory Visit: Payer: Medicaid Other | Admitting: Family Medicine

## 2018-11-06 ENCOUNTER — Ambulatory Visit
Admission: RE | Admit: 2018-11-06 | Discharge: 2018-11-06 | Disposition: A | Discharge status: Home / Self Care | Payer: Medicaid Other | Source: Ambulatory Visit | Attending: Family Medicine | Admitting: Family Medicine

## 2018-11-06 ENCOUNTER — Encounter: Payer: Self-pay | Admitting: Family Medicine

## 2018-11-06 VITALS — BP 120/80 | HR 74 | Temp 98.3°F | Ht 62.0 in | Wt 237.0 lb

## 2018-11-06 DIAGNOSIS — J4521 Mild intermittent asthma with (acute) exacerbation: Secondary | ICD-10-CM

## 2018-11-06 DIAGNOSIS — Z8709 Personal history of other diseases of the respiratory system: Secondary | ICD-10-CM | POA: Diagnosis not present

## 2018-11-06 MED ORDER — ALBUTEROL SULFATE (2.5 MG/3ML) 0.083% IN NEBU
5.0000 mg | INHALATION_SOLUTION | Freq: Once | RESPIRATORY_TRACT | Status: AC
  Administered 2018-11-06: 5 mg via RESPIRATORY_TRACT

## 2018-11-06 MED ORDER — CETIRIZINE HCL 10 MG PO TABS: 10.0000 mg | ORAL_TABLET | Freq: Every day | ORAL | 1 refills | Status: DC

## 2018-11-06 MED ORDER — FLUTICASONE PROPIONATE 50 MCG/ACT NA SUSP: 1.0000 | Freq: Every day | NASAL | 2 refills | Status: DC

## 2018-11-06 MED ORDER — PREDNISONE 20 MG PO TABS: 40.0000 mg | ORAL_TABLET | Freq: Every day | ORAL | 0 refills | Status: DC

## 2018-11-06 MED ORDER — ALBUTEROL SULFATE (2.5 MG/3ML) 0.083% IN NEBU
2.5000 mg | INHALATION_SOLUTION | Freq: Once | RESPIRATORY_TRACT | Status: AC
  Administered 2018-11-06: 2.5 mg via RESPIRATORY_TRACT

## 2018-11-06 MED ORDER — IPRATROPIUM BROMIDE 0.02 % IN SOLN
0.5000 mg | Freq: Once | RESPIRATORY_TRACT | Status: AC
  Administered 2018-11-06: 0.5 mg via RESPIRATORY_TRACT

## 2018-11-06 MED ORDER — ALBUTEROL SULFATE HFA 108 (90 BASE) MCG/ACT IN AERS: 2.0000 | INHALATION_SPRAY | Freq: Four times a day (QID) | RESPIRATORY_TRACT | 2 refills | Status: DC | PRN

## 2018-11-06 NOTE — Progress Notes (Signed)
  Subjective:    Patient ID: Lynn Wilcox, female    DOB: 03-Feb-1981, 37 y.o.   MRN: [id number]   CC: Cough and congestion  HPI:  Asthma exacerbation Patient reports that she has had congestion with productive cough, wheezing and hoarseness for 1 month now.  Patient states the beginning of her illness in December she was feeling much worse and thought that she might feel better. She reports that her cough has not gotten any better.  States that she has not had to use her albuterol inhaler for many months but during this illness that she has started having to use it nightly. Patient denies any fever, chills, ear pain.  Patient states that her son and her husband had both also previously been sick but she seems to not be getting over it.  Patient states that she came in today after this illness because she was at work doing her normal activities when she got extremely short of breath and had to sit down which is not normal for her. Patient has tried TheraFlu, ginger tea in order to help but she does not like to take many medications.  Smoking status reviewed  ROS: 10 point ROS is otherwise negative, except as mentioned in HPI  Patient Active Problem List   Diagnosis Date Noted  . Mild intermittent asthma with acute exacerbation 11/07/2018  . Pain of joint of left ankle and foot 07/26/2018  . Lipoma of left forearm 06/18/2018  . Encounter for preventive care 07/18/2017  . Right shoulder pain 07/18/2017  . Bilateral carpal tunnel syndrome 09/25/2013  . History of asthma 05/13/2013  . GERD (gastroesophageal reflux disease) 04/15/2013     Objective:  BP 120/80   Pulse 74   Temp 98.3 F (36.8 C) (Oral)   Ht 5\' 2"  (1.575 m)   Wt 237 lb (107.5 kg)   LMP 10/18/2018   SpO2 96% Comment: ambulatory. stayed near 98% dropped to 96 when talking/[place]  BMI 43.35 kg/m  Vitals and nursing note reviewed  General: NAD, pleasant HEENT: MMM, oropharynx with no erythema or exudate noted, nasal  passages with some congestion, no cervical lymphadenopathy Cardiac: RRR, normal heart sounds, no murmurs Respiratory: Wheezing throughout with decreased breath sounds, no crackles noted, normal effort Extremities: no edema or cyanosis. WWP. Skin: warm and dry, no rashes noted Neuro: alert and oriented, no focal deficits Psych: normal affect  Assessment & Plan:    Mild intermittent asthma with acute exacerbation Patient given albuterol treatment x2 in office with some mild improvement in breathing.  Acute exacerbation, with productive cough for 1 month. Wheezing on auscultation despite using inhalers, however good air movement and normal WOB on RA, patient only desatted to 96% with ambulatory pulse ox. Afebrile and saturations ok, so will hold off on admission. Will treat with steroid burst. Patient understanding of this and understanding of when to return.  - Prednisone 40mg  qd x5d -Will obtain CXR given length of productive cough. - Return precautions given, patient voiced understanding - Continue Albuterol every 4 hours and patient is to follow-up tomorrow in order to further assess her work of breathing     [place] Debora [place], [facility] Resident PGY-2

## 2018-11-06 NOTE — Patient Instructions (Addendum)
Thank you for coming to see me today. It was a pleasure! Today we talked about:   Please go have a chest x-ray done.  When the results come back with this we will call you.  I have sent prednisone to your pharmacy as soon as you pick this up please take 2 tablets.   Have scheduled you a follow-up appointment tomorrow at 9:10 AM in order to reevaluate your breathing to make sure that you do not need to go into the hospital and have improved.  Please be sure to use your albuterol every 4 hours for the next 24 hours while you are awake.  If you have any worsening shortness of breath, trouble breathing or develop fevers then please do not hesitate to go to the emergency room.  Please follow-up with me in  or as needed.  If you have any questions or concerns, please do not hesitate to call the office at [phone].  Take Care,   [place] Conception Doebler, DO

## 2018-11-07 ENCOUNTER — Encounter: Payer: Self-pay | Admitting: Family Medicine

## 2018-11-07 ENCOUNTER — Ambulatory Visit (INDEPENDENT_AMBULATORY_CARE_PROVIDER_SITE_OTHER): Payer: Medicaid Other | Admitting: Family Medicine

## 2018-11-07 VITALS — BP 125/80 | HR 82 | Temp 98.5°F | Wt 235.4 lb

## 2018-11-07 DIAGNOSIS — J4521 Mild intermittent asthma with (acute) exacerbation: Secondary | ICD-10-CM

## 2018-11-07 MED ORDER — MONTELUKAST SODIUM 10 MG PO TABS: 10.0000 mg | ORAL_TABLET | Freq: Every day | ORAL | 3 refills | Status: DC

## 2018-11-07 NOTE — Assessment & Plan Note (Signed)
Much improved from the previous day and no signs of infectious process on chest x-ray. Discussed with patient if acute episodes increase in frequency then consider starting a controller mediation and may need to get PFTs. Given history of year long allergies that seem poorly controlled will start allergy medication today. Instructed patient to take both allergy medications year round. - cont Albuterol scheduled q8 hours today, then back to prn - Start Singulair 10mg  daily - Cont Claritin OTC daily - Patient reports she does NOT take Zyrtec

## 2018-11-07 NOTE — Patient Instructions (Addendum)
It was great to see you today! Thank you for letting me participate in your care!  Today, we discussed your acute asthma exacerbation and I am glad you are doing well. Your chest x-ray was normal and you are responding well to the prednisone and albuterol.  Continue the prednisone and finishing taking it. Please take 2 puffs of albuterol today every 8 hours and then tomorrow go back to taking it as needed.   Please also continue taking your allergy medication and I have started you on Singulair for better allergy control.  Be well, Lynn Rutherford, DO PGY-2, [facility]

## 2018-11-07 NOTE — Progress Notes (Signed)
     Subjective: Chief Complaint  Patient presents with  . Results  . Breathing Problem    follow up    HPI: Lynn Wilcox is a 38 y.o. presenting to clinic today to discuss the following:  Acute Asthma Exacerbation Patient is a 37y/o female with PMH of asthma, allergies who presents today for follow up after being seen in the clinic yesterday for an acute asthma exacerbation. She was started on prednisone and albuterol scheduled every 4 hours. Today she states she is doing better, has little to no chest tightness, is not SOB, and feels much improved. She is still having a lot of mucus production which is normal for her after a cold. She states she had a cold and then yesterday had an asthma attack. She does not get these often and if she is not sick usually does not need her albuterol inhaler.  She does suffer from allergies which gives her year long rhinorrhea, congestion, and mucus production. She states she currently takes Claritin OTC.  Health Maintenance: none today     ROS noted in HPI.   Past Medical, Surgical, Social, and Family History Reviewed & Updated per EMR.   Pertinent Historical Findings include:   Social History   Tobacco Use  Smoking Status Former Smoker  . Types: Cigarettes  . Last attempt to quit: 2003  . Years since quitting: 17.0  Smokeless Tobacco Never Used    Objective: BP 125/80   Pulse 82   Temp 98.5 F (36.9 C)   Wt 235 lb 6.4 oz (106.8 kg)   LMP 10/18/2018   SpO2 99%   BMI 43.06 kg/m  Vitals and nursing notes reviewed  Physical Exam Gen: Alert and Oriented x 3, NAD HEENT: Normocephalic, atraumatic, PERRLA, EOMI, TM visible with good light reflex, swollen, boggy,  erythematous turbinates, non-erythematous pharyngeal mucosa, no exudates CV: RRR, no murmurs, normal S1, S2 split Resp: CTAB, mild wheezing in bibasilar lung fields, no rales or rhonchi, comfortable work of breathing Ext: no clubbing, cyanosis, or edema Skin: warm, dry,  intact, no rashes  LABS DG Chest 2-view: No active cardiopulmonary diseasse  Assessment/Plan:  Mild intermittent asthma with acute exacerbation Much improved from the previous day and no signs of infectious process on chest x-ray. Discussed with patient if acute episodes increase in frequency then consider starting a controller mediation and may need to get PFTs. Given history of year long allergies that seem poorly controlled will start allergy medication today. Instructed patient to take both allergy medications year round. - cont Albuterol scheduled q8 hours today, then back to prn - Start Singulair 10mg  daily - Cont Claritin OTC daily - Patient reports she does NOT take Zyrtec   PATIENT EDUCATION PROVIDED: See AVS    Diagnosis and plan along with any newly prescribed medication(s) were discussed in detail with this patient today. The patient verbalized understanding and agreed with the plan. Patient advised if symptoms worsen return to clinic or ER.   Health Maintainance: none   No orders of the defined types were placed in this encounter.   Meds ordered this encounter  Medications  . montelukast (SINGULAIR) 10 MG tablet    Sig: Take 1 tablet (10 mg total) by mouth at bedtime.    Dispense:  30 tablet    Refill:  Hope, DO 11/07/2018, 9:32 AM PGY-2 [place]

## 2018-11-12 NOTE — Assessment & Plan Note (Signed)
Patient given albuterol treatment x2 in office with some mild improvement in breathing.  Acute exacerbation, with productive cough for 1 month. Wheezing on auscultation despite using inhalers, however good air movement and normal WOB on RA, patient only desatted to 96% with ambulatory pulse ox. Afebrile and saturations ok, so will hold off on admission. Will treat with steroid burst. Patient understanding of this and understanding of when to return.  - Prednisone 40mg  qd x5d -Will obtain CXR given length of productive cough. - Return precautions given, patient voiced understanding - Continue Albuterol every 4 hours and patient is to follow-up tomorrow in order to further assess her work of breathing

## 2019-03-04 ENCOUNTER — Other Ambulatory Visit: Payer: Self-pay | Admitting: Family Medicine

## 2019-08-11 ENCOUNTER — Emergency Department (HOSPITAL_COMMUNITY)
Admission: EM | Admit: 2019-08-11 | Discharge: 2019-08-12 | Disposition: A | Discharge status: Home / Self Care | Payer: Medicaid Other | Attending: Emergency Medicine | Admitting: Emergency Medicine

## 2019-08-11 ENCOUNTER — Emergency Department (HOSPITAL_COMMUNITY): Payer: Medicaid Other

## 2019-08-11 ENCOUNTER — Other Ambulatory Visit: Payer: Self-pay

## 2019-08-11 ENCOUNTER — Encounter (HOSPITAL_COMMUNITY): Payer: Self-pay | Admitting: Emergency Medicine

## 2019-08-11 DIAGNOSIS — J45909 Unspecified asthma, uncomplicated: Secondary | ICD-10-CM | POA: Insufficient documentation

## 2019-08-11 DIAGNOSIS — K529 Noninfective gastroenteritis and colitis, unspecified: Secondary | ICD-10-CM | POA: Diagnosis not present

## 2019-08-11 DIAGNOSIS — Z87891 Personal history of nicotine dependence: Secondary | ICD-10-CM | POA: Diagnosis not present

## 2019-08-11 DIAGNOSIS — R112 Nausea with vomiting, unspecified: Secondary | ICD-10-CM | POA: Diagnosis present

## 2019-08-11 DIAGNOSIS — Z79899 Other long term (current) drug therapy: Secondary | ICD-10-CM | POA: Insufficient documentation

## 2019-08-11 LAB — COMPREHENSIVE METABOLIC PANEL
ALT: 18 U/L (ref 0–44)
AST: 16 U/L (ref 15–41)
Albumin: 3.7 g/dL (ref 3.5–5.0)
Alkaline Phosphatase: 54 U/L (ref 38–126)
Anion gap: 10 (ref 5–15)
BUN: 6 mg/dL (ref 6–20)
CO2: 23 mmol/L (ref 22–32)
Calcium: 9.6 mg/dL (ref 8.9–10.3)
Chloride: 104 mmol/L (ref 98–111)
Creatinine, Ser: 0.78 mg/dL (ref 0.44–1.00)
GFR calc Af Amer: >60 mL/min (ref >60)
GFR calc non Af Amer: >60 mL/min (ref >60)
Glucose, Bld: 116 mg/dL — ABNORMAL HIGH (ref 70–99)
Potassium: 4 mmol/L (ref 3.5–5.1)
Sodium: 137 mmol/L (ref 135–145)
Total Bilirubin: 0.5 mg/dL (ref 0.3–1.2)
Total Protein: 7.7 g/dL (ref 6.5–8.1)

## 2019-08-11 LAB — URINALYSIS, ROUTINE W REFLEX MICROSCOPIC
Bilirubin Urine: NEGATIVE
Glucose, UA: NEGATIVE mg/dL
Hgb urine dipstick: NEGATIVE
Ketones, ur: 80 mg/dL — AB
Leukocytes,Ua: NEGATIVE
Nitrite: NEGATIVE
Protein, ur: NEGATIVE mg/dL
Specific Gravity, Urine: 1.019 (ref 1.005–1.030)
pH: 6 (ref 5.0–8.0)

## 2019-08-11 LAB — CBC
HCT: 39 % (ref 36.0–46.0)
Hemoglobin: 12.3 g/dL (ref 12.0–15.0)
MCH: 24.8 pg — ABNORMAL LOW (ref 26.0–34.0)
MCHC: 31.5 g/dL (ref 30.0–36.0)
MCV: 78.8 fL — ABNORMAL LOW (ref 80.0–100.0)
Platelets: 287 10*3/uL (ref 150–400)
RBC: 4.95 MIL/uL (ref 3.87–5.11)
RDW: 14.5 % (ref 11.5–15.5)
WBC: 11.5 10*3/uL — ABNORMAL HIGH (ref 4.0–10.5)
nRBC: 0 % (ref 0.0–0.2)

## 2019-08-11 LAB — I-STAT BETA HCG BLOOD, ED (MC, WL, AP ONLY): I-stat hCG, quantitative: <5 m[IU]/mL (ref <5)

## 2019-08-11 LAB — LIPASE, BLOOD: Lipase: 23 U/L (ref 11–51)

## 2019-08-11 MED ORDER — ONDANSETRON HCL 4 MG/2ML IJ SOLN
4.0000 mg | Freq: Once | INTRAMUSCULAR | Status: AC
  Administered 2019-08-11: 20:00:00 4 mg via INTRAVENOUS
  Filled 2019-08-11: qty 2

## 2019-08-11 MED ORDER — METOCLOPRAMIDE HCL 5 MG/ML IJ SOLN
5.0000 mg | Freq: Once | INTRAMUSCULAR | Status: AC
  Administered 2019-08-11: 5 mg via INTRAVENOUS
  Filled 2019-08-11: qty 2

## 2019-08-11 MED ORDER — PROMETHAZINE HCL 25 MG/ML IJ SOLN
25.0000 mg | Freq: Once | INTRAMUSCULAR | Status: AC
  Administered 2019-08-11: 21:00:00 25 mg via INTRAVENOUS
  Filled 2019-08-11: qty 1

## 2019-08-11 MED ORDER — SODIUM CHLORIDE 0.9% FLUSH: 3.0000 mL | Freq: Once | INTRAVENOUS | Status: DC

## 2019-08-11 MED ORDER — ONDANSETRON 4 MG PO TBDP
4.0000 mg | ORAL_TABLET | Freq: Once | ORAL | Status: AC | PRN
  Administered 2019-08-11: 4 mg via ORAL
  Filled 2019-08-11: qty 1

## 2019-08-11 MED ORDER — SODIUM CHLORIDE 0.9 % IV BOLUS
1000.0000 mL | Freq: Once | INTRAVENOUS | Status: AC
  Administered 2019-08-11: 1000 mL via INTRAVENOUS

## 2019-08-11 MED ORDER — PROMETHAZINE HCL 25 MG PO TABS: 25.0000 mg | ORAL_TABLET | Freq: Four times a day (QID) | ORAL | 0 refills | Status: DC | PRN

## 2019-08-11 MED ORDER — IOHEXOL 300 MG/ML  SOLN
100.0000 mL | Freq: Once | INTRAMUSCULAR | Status: AC | PRN
  Administered 2019-08-11: 23:00:00 100 mL via INTRAVENOUS

## 2019-08-11 NOTE — Discharge Instructions (Signed)
Thank you for allowing me to care for you today. Please return to the emergency department if you have new or worsening symptoms. Take your medications as instructed.  ° °

## 2019-08-11 NOTE — ED Provider Notes (Signed)
[place] EMERGENCY DEPARTMENT Provider Note   CSN: [id number] Arrival date & time: 08/11/19  1802     History   Chief Complaint Chief Complaint  Patient presents with   Abdominal Pain    HPI Lynn Wilcox is a 38 y.o. female.     Patient is a 38 year old female with past mental status GERD, obesity, asthma, anxiety and depression presenting to the emergency department for nausea vomiting and epigastric pain.  Patient reports that around 2 PM just before she started eating she began to have some cramping in her epigastric region which then caused her to vomit.  Reports that she has been nauseated and had about 12 episodes of nonbloody, nonbilious vomiting.  She reports that it feels like she is having cramping in her stomach but only when she vomits and not at rest.  Reports that she is feeling hot and cold chills as well.  Had one episode of diarrhea today.  No fever, dysuria, vaginal discharge or bleeding, sick contacts.     Past Medical History:  Diagnosis Date   Anxiety and depression 1995   Arthritis 2014   Asthma 1993   Uses inhaler prn   H/O gastroesophageal reflux (GERD) 2013   Migraines    Obesity    Seasonal allergies     Patient Active Problem List   Diagnosis Date Noted   Mild intermittent asthma with acute exacerbation 11/07/2018   Pain of joint of left ankle and foot 07/26/2018   Lipoma of left forearm 06/18/2018   Encounter for preventive care 07/18/2017   Right shoulder pain 07/18/2017   Bilateral carpal tunnel syndrome 09/25/2013   History of asthma 05/13/2013   GERD (gastroesophageal reflux disease) 04/15/2013    Past Surgical History:  Procedure Laterality Date   BREAST BIOPSY     age 25   WISDOM TOOTH EXTRACTION       OB History    Gravida  2   Para  2   Term  2   Preterm      AB      Living  2     SAB      TAB      Ectopic      Multiple      Live Births  2            Home  Medications    Prior to Admission medications   Medication Sig Start Date End Date Taking? Authorizing Provider  albuterol (PROVENTIL HFA;VENTOLIN HFA) 108 (90 Base) MCG/ACT inhaler Inhale 2 puffs into the lungs every 6 (six) hours as needed for wheezing. 11/06/18   Shirley, [place], DO  cetirizine (ZYRTEC) 10 MG tablet TAKE 1 TABLET BY MOUTH DAILY 03/04/19   Rory Percy, DO  ibuprofen (ADVIL,MOTRIN) 600 MG tablet Take 1 tablet (600 mg total) by mouth every 6 (six) hours as needed for mild pain, moderate pain or cramping. 08/24/13   Roma Schanz, CNM  montelukast (SINGULAIR) 10 MG tablet Take 1 tablet (10 mg total) by mouth at bedtime. 11/07/18   Nuala Alpha, DO  predniSONE (DELTASONE) 20 MG tablet Take 2 tablets (40 mg total) by mouth daily with breakfast. 11/06/18   Shirley, [place], DO  promethazine (PHENERGAN) 25 MG tablet Take 1 tablet (25 mg total) by mouth every 6 (six) hours as needed for nausea or vomiting. 08/11/19   Alveria Apley, PA-C    Family History Family History  Problem Relation Age of Onset   Stroke  Mother    Hypertension Mother    Hyperlipidemia Mother     Social History Social History   Tobacco Use   Smoking status: Former Smoker    Types: Cigarettes    Quit date: 2003    Years since quitting: 17.8   Smokeless tobacco: Never Used  Substance Use Topics   Alcohol use: No   Drug use: No     Allergies   Chocolate, Coffee bean extract [coffea arabica], Soy allergy, Tea, and Yeast-related products   Review of Systems Review of Systems  Constitutional: Positive for appetite change and chills. Negative for fever.  HENT: Negative for congestion and sore throat.   Respiratory: Negative for cough and shortness of breath.   Cardiovascular: Negative for chest pain, palpitations and leg swelling.  Gastrointestinal: Positive for abdominal pain, nausea and vomiting. Negative for constipation and diarrhea.  Genitourinary: Negative for difficulty  urinating, dysuria, vaginal bleeding and vaginal discharge.  Musculoskeletal: Negative for arthralgias and myalgias.  Skin: Negative for rash and wound.  Neurological: Negative for dizziness, light-headedness and headaches.     Physical Exam Updated Vital Signs BP (!) 123/104    Pulse 65    Temp (!) 97.5 F (36.4 C) (Oral)    Resp 18    SpO2 98%   Physical Exam Vitals signs and nursing note reviewed.  Constitutional:      General: She is not in acute distress.    Appearance: She is well-developed. She is not ill-appearing, toxic-appearing or diaphoretic.  HENT:     Head: Normocephalic and atraumatic.     Mouth/Throat:     Mouth: Mucous membranes are moist.  Cardiovascular:     Rate and Rhythm: Normal rate and regular rhythm.  Pulmonary:     Effort: Pulmonary effort is normal.  Abdominal:     General: Abdomen is flat. Bowel sounds are normal. There is no distension.     Palpations: Abdomen is soft.     Tenderness: There is no abdominal tenderness.  Skin:    General: Skin is warm.  Neurological:     Mental Status: She is alert.  Psychiatric:        Mood and Affect: Mood normal.      ED Treatments / Results  Labs (all labs ordered are listed, but only abnormal results are displayed) Labs Reviewed  COMPREHENSIVE METABOLIC PANEL - Abnormal; Notable for the following components:      Result Value   Glucose, Bld 116 (*)    All other components within normal limits  CBC - Abnormal; Notable for the following components:   WBC 11.5 (*)    MCV 78.8 (*)    MCH 24.8 (*)    All other components within normal limits  URINALYSIS, ROUTINE W REFLEX MICROSCOPIC - Abnormal; Notable for the following components:   Ketones, ur 80 (*)    All other components within normal limits  LIPASE, BLOOD  I-STAT BETA HCG BLOOD, ED ([facility], [facility], [facility] ONLY)    EKG None  Radiology Ct Abdomen Pelvis W Contrast  Result Date: 08/11/2019 CLINICAL DATA:  Abdominal distension EXAM: CT ABDOMEN AND  PELVIS WITH CONTRAST TECHNIQUE: Multidetector CT imaging of the abdomen and pelvis was performed using the standard protocol following bolus administration of intravenous contrast. CONTRAST:  130mL OMNIPAQUE IOHEXOL 300 MG/ML  SOLN COMPARISON:  None. FINDINGS: Lower chest: The lung bases are clear. The heart size is normal. Hepatobiliary: The liver is normal. Normal gallbladder.There is no biliary ductal dilation. Pancreas: Normal contours  without ductal dilatation. No peripancreatic fluid collection. Spleen: No splenic laceration or hematoma. Adrenals/Urinary Tract: --Adrenal glands: No adrenal hemorrhage. --Right kidney/ureter: No hydronephrosis or perinephric hematoma. --Left kidney/ureter: No hydronephrosis or perinephric hematoma. --Urinary bladder: Unremarkable. Stomach/Bowel: --Stomach/Duodenum: There is a small hiatal hernia. --Small bowel: No dilatation or inflammation. --Colon: There is wall thickening of the ascending colon and transverse colon. There are few scattered colonic diverticula without CT evidence for diverticulitis. --Appendix: Normal. Vascular/Lymphatic: Normal course and caliber of the major abdominal vessels. --No retroperitoneal lymphadenopathy. --No mesenteric lymphadenopathy. --No pelvic or inguinal lymphadenopathy. Reproductive: Unremarkable Other: No ascites or free air. There is a moderate-sized fat containing periumbilical hernia. Musculoskeletal. No acute displaced fractures. IMPRESSION: Mild diffuse wall thickening of the ascending colon and transverse colon may be secondary to underdistention versus infectious or inflammatory colitis. Electronically Signed   By: Constance Holster M.D.   On: 08/11/2019 22:59    Procedures Procedures (including critical care time)  Medications Ordered in ED Medications  sodium chloride flush (NS) 0.9 % injection 3 mL (has no administration in time range)  ondansetron (ZOFRAN-ODT) disintegrating tablet 4 mg (4 mg Oral Given 08/11/19 1814)    ondansetron (ZOFRAN) injection 4 mg (4 mg Intravenous Given 08/11/19 1952)  sodium chloride 0.9 % bolus 1,000 mL (0 mLs Intravenous Stopped 08/11/19 2119)  promethazine (PHENERGAN) injection 25 mg (25 mg Intravenous Given 08/11/19 2111)  iohexol (OMNIPAQUE) 300 MG/ML solution 100 mL (100 mLs Intravenous Contrast Given 08/11/19 2245)     Initial Impression / Assessment and Plan / ED Course  I have reviewed the triage vital signs and the nursing notes.  Pertinent labs & imaging results that were available during my care of the patient were reviewed by me and considered in my medical decision making (see chart for details).  Clinical Course as of Aug 10 2320  Mon Aug 11, 2019  2118 Patient presenting with epigastric abdominal pain and nausea vomiting with chills and fatigue since this afternoon.  Patient continued to vomit after fluids and Zofran.  Phenergan given and CT abdomen pelvis ordered.   [KM]  2313 Patient Ct revealing "Mild diffuse wall thickening of the ascending colon and transverse colon may be secondary to underdistention versus infectious or inflammatory colitis.". At this time the patient is improved, having no belly tenderness at all, a febrile. I feel the patient's symptoms are related to a viral infection rather than bacterial and I do not think abx are warranted at this time. Case discussed with Dr. Darl Householder and plan agreed upon. Patient was given a liter of fluids and zofran and phenergan. Patient advised on strict return precautions.      [KM]  2316 Patient with symptoms consistent with viral gastroenteritis.  Vitals are stable, no fever.  No signs of dehydration, tolerating PO fluids > 6 oz.  Lungs are clear.  No focal abdominal pain, no concern for appendicitis, cholecystitis, pancreatitis, ruptured viscus, UTI, kidney stone, or any other abdominal etiology.  Supportive therapy indicated with return if symptoms worsen.  Patient counseled.    [KM]    Clinical Course User  Index [KM] Alveria Apley, PA-C       Based on review of vitals, medical screening exam, lab work and/or imaging, there does not appear to be an acute, emergent etiology for the patient's symptoms. Counseled pt on good return precautions and encouraged both PCP and ED follow-up as needed.  Prior to discharge, I also discussed incidental imaging findings with patient in detail and advised  appropriate, recommended follow-up in detail.  Clinical Impression: 1. Non-intractable vomiting with nausea, unspecified vomiting type   2. Gastroenteritis     Disposition: Discharge  Prior to providing a prescription for a controlled substance, I independently reviewed the patient's recent prescription history on the [facility]. The patient had no recent or regular prescriptions and was deemed appropriate for a brief, less than 3 day prescription of narcotic for acute analgesia.  This note was prepared with assistance of Systems analyst. Occasional wrong-word or sound-a-like substitutions may have occurred due to the inherent limitations of voice recognition software.   Final Clinical Impressions(s) / ED Diagnoses   Final diagnoses:  Non-intractable vomiting with nausea, unspecified vomiting type  Gastroenteritis    ED Discharge Orders         Ordered    promethazine (PHENERGAN) 25 MG tablet  Every 6 hours PRN     08/11/19 2317           Alveria Apley, PA-C 08/11/19 2321    Drenda Freeze, MD 08/12/19 1335

## 2019-08-11 NOTE — ED Triage Notes (Signed)
Pt reports upper abdominal pain that started around 1420 today, states its an aching with nausea, vomiting, and one episode of diarrhea

## 2019-08-12 NOTE — ED Notes (Signed)
Patient verbalizes understanding of discharge instructions. Opportunity for questioning and answers were provided. Armband removed by staff, pt discharged from ED.  

## 2019-08-13 ENCOUNTER — Emergency Department (HOSPITAL_COMMUNITY)
Admission: EM | Admit: 2019-08-13 | Discharge: 2019-08-14 | Disposition: A | Discharge status: Home / Self Care | Payer: Medicaid Other | Attending: Emergency Medicine | Admitting: Emergency Medicine

## 2019-08-13 ENCOUNTER — Other Ambulatory Visit: Payer: Self-pay

## 2019-08-13 ENCOUNTER — Encounter (HOSPITAL_COMMUNITY): Payer: Self-pay | Admitting: Emergency Medicine

## 2019-08-13 DIAGNOSIS — R1010 Upper abdominal pain, unspecified: Secondary | ICD-10-CM | POA: Insufficient documentation

## 2019-08-13 DIAGNOSIS — Z87891 Personal history of nicotine dependence: Secondary | ICD-10-CM | POA: Diagnosis not present

## 2019-08-13 DIAGNOSIS — Z79899 Other long term (current) drug therapy: Secondary | ICD-10-CM | POA: Diagnosis not present

## 2019-08-13 DIAGNOSIS — Z8709 Personal history of other diseases of the respiratory system: Secondary | ICD-10-CM | POA: Insufficient documentation

## 2019-08-13 DIAGNOSIS — R112 Nausea with vomiting, unspecified: Secondary | ICD-10-CM | POA: Insufficient documentation

## 2019-08-13 LAB — URINALYSIS, ROUTINE W REFLEX MICROSCOPIC
Bilirubin Urine: NEGATIVE
Glucose, UA: NEGATIVE mg/dL
Hgb urine dipstick: NEGATIVE
Ketones, ur: 80 mg/dL — AB
Leukocytes,Ua: NEGATIVE
Nitrite: NEGATIVE
Protein, ur: 30 mg/dL — AB
Specific Gravity, Urine: 1.028 (ref 1.005–1.030)
pH: 6 (ref 5.0–8.0)

## 2019-08-13 LAB — CBC
HCT: 39.6 % (ref 36.0–46.0)
Hemoglobin: 12.6 g/dL (ref 12.0–15.0)
MCH: 24.9 pg — ABNORMAL LOW (ref 26.0–34.0)
MCHC: 31.8 g/dL (ref 30.0–36.0)
MCV: 78.1 fL — ABNORMAL LOW (ref 80.0–100.0)
Platelets: 316 10*3/uL (ref 150–400)
RBC: 5.07 MIL/uL (ref 3.87–5.11)
RDW: 14.9 % (ref 11.5–15.5)
WBC: 8.6 10*3/uL (ref 4.0–10.5)
nRBC: 0 % (ref 0.0–0.2)

## 2019-08-13 LAB — LIPASE, BLOOD: Lipase: 29 U/L (ref 11–51)

## 2019-08-13 LAB — I-STAT BETA HCG BLOOD, ED (MC, WL, AP ONLY): I-stat hCG, quantitative: <5 m[IU]/mL (ref <5)

## 2019-08-13 LAB — COMPREHENSIVE METABOLIC PANEL
ALT: 17 U/L (ref 0–44)
AST: 15 U/L (ref 15–41)
Albumin: 3.6 g/dL (ref 3.5–5.0)
Alkaline Phosphatase: 50 U/L (ref 38–126)
Anion gap: 12 (ref 5–15)
BUN: 8 mg/dL (ref 6–20)
CO2: 23 mmol/L (ref 22–32)
Calcium: 9.1 mg/dL (ref 8.9–10.3)
Chloride: 104 mmol/L (ref 98–111)
Creatinine, Ser: 0.79 mg/dL (ref 0.44–1.00)
GFR calc Af Amer: >60 mL/min (ref >60)
GFR calc non Af Amer: >60 mL/min (ref >60)
Glucose, Bld: 126 mg/dL — ABNORMAL HIGH (ref 70–99)
Potassium: 3.1 mmol/L — ABNORMAL LOW (ref 3.5–5.1)
Sodium: 139 mmol/L (ref 135–145)
Total Bilirubin: 0.6 mg/dL (ref 0.3–1.2)
Total Protein: 8.4 g/dL — ABNORMAL HIGH (ref 6.5–8.1)

## 2019-08-13 MED ORDER — SODIUM CHLORIDE 0.9% FLUSH: 3.0000 mL | Freq: Once | INTRAVENOUS | Status: DC

## 2019-08-13 MED ORDER — DICYCLOMINE HCL 20 MG PO TABS: 20.0000 mg | ORAL_TABLET | Freq: Two times a day (BID) | ORAL | 0 refills | Status: DC

## 2019-08-13 MED ORDER — PROMETHAZINE HCL 25 MG/ML IJ SOLN
25.0000 mg | Freq: Once | INTRAMUSCULAR | Status: AC
  Administered 2019-08-13: 25 mg via INTRAVENOUS
  Filled 2019-08-13: qty 1

## 2019-08-13 MED ORDER — PROMETHAZINE HCL 25 MG RE SUPP: 25.0000 mg | Freq: Four times a day (QID) | RECTAL | 0 refills | Status: DC | PRN

## 2019-08-13 MED ORDER — SODIUM CHLORIDE 0.9 % IV BOLUS
1000.0000 mL | Freq: Once | INTRAVENOUS | Status: AC
  Administered 2019-08-13: 1000 mL via INTRAVENOUS

## 2019-08-13 NOTE — ED Provider Notes (Signed)
Lynn Wilcox is a 38 y.o. female, presenting to the ED with nausea and vomiting.  HPI from [facility]ius, PA-C: "Lynn Wilcox is a 38 y.o. female past medical history of depression, asthma, GERD, migraines, obesity who presents for evaluation of persistent nausea/vomiting, upper abdominal pain.  Patient was seen here in the ED on 08/11/2019 for evaluation of same symptoms.  Work-up was reassuring.  CT scan showed possible infectious versus inflammatory colitis.  During her work-up, she was able to tolerate p.o.  At that time, it was thought that her symptoms are viral and did not need antibiotics.  She was discharged home with Phenergan.  Patient comes back in today for continued nausea/vomiting, abdominal pain.  She reports upper abdominal pain is intermittent and hurts worse when she vomits.  She reports she has been trying to take the Phenergan but is not been able to tolerate any p.o. today.  She reports approximate 12 episodes of nonbloody, nonbilious emesis.  No fevers, chills, diarrhea, urinary complaints, chest pain, difficulty breathing, cough.  She currently denies any abdominal pain at this time."  Past Medical History:  Diagnosis Date  . Anxiety and depression 1995  . Arthritis 2014  . Asthma 1993   Uses inhaler prn  . H/O gastroesophageal reflux (GERD) 2013  . Migraines   . Obesity   . Seasonal allergies     Past Surgical History:  Procedure Laterality Date  . BREAST BIOPSY     age 36  . WISDOM TOOTH EXTRACTION     Physical Exam  BP (!) 144/92   Pulse 69   Temp 98.3 F (36.8 C) (Oral)   Resp 16   Ht 5\' 3"  (1.6 m)   Wt 111.1 kg   LMP 08/07/2019   SpO2 100%   BMI 43.40 kg/m   Physical Exam Vitals signs and nursing note reviewed.  Constitutional:      General: She is not in acute distress.    Appearance: She is well-developed. She is not diaphoretic.  HENT:     Head: Normocephalic and atraumatic.     Mouth/Throat:     Mouth: Mucous membranes are moist.      Pharynx: Oropharynx is clear.  Eyes:     Conjunctiva/sclera: Conjunctivae normal.  Neck:     Musculoskeletal: Neck supple.  Cardiovascular:     Rate and Rhythm: Normal rate and regular rhythm.     Pulses: Normal pulses.          Radial pulses are 2+ on the right side and 2+ on the left side.     Heart sounds: Normal heart sounds.     Comments: Tactile temperature in the extremities appropriate and equal bilaterally. Pulmonary:     Effort: Pulmonary effort is normal. No respiratory distress.     Breath sounds: Normal breath sounds.  Abdominal:     Palpations: Abdomen is soft.     Tenderness: There is no abdominal tenderness. There is no guarding.  Musculoskeletal:     Right lower leg: No edema.     Left lower leg: No edema.  Lymphadenopathy:     Cervical: No cervical adenopathy.  Skin:    General: Skin is warm and dry.  Neurological:     Mental Status: She is alert.  Psychiatric:        Mood and Affect: Mood and affect normal.        Speech: Speech normal.        Behavior: Behavior normal.  ED Course/Procedures    Procedures   Abnormal Labs Reviewed  COMPREHENSIVE METABOLIC PANEL - Abnormal; Notable for the following components:      Result Value   Potassium 3.1 (*)    Glucose, Bld 126 (*)    Total Protein 8.4 (*)    All other components within normal limits  CBC - Abnormal; Notable for the following components:   MCV 78.1 (*)    MCH 24.9 (*)    All other components within normal limits  URINALYSIS, ROUTINE W REFLEX MICROSCOPIC - Abnormal; Notable for the following components:   Ketones, ur 80 (*)    Protein, ur 30 (*)    Bacteria, UA RARE (*)    All other components within normal limits    MDM   Clinical Course as of Aug 12 2332  Wed Aug 13, 2019  1731 Patient states she feels better.   [SJ]    Clinical Course User Index [SJ] [name], PA-C   Patient care handoff report received from [facility], PA-C. Plan: UA pending. PO fluid  challenge.   Patient presents with nausea and vomiting.  She also admits to poor oral intake for several days. Patient is nontoxic appearing, afebrile, not tachycardic, not tachypneic, not hypotensive, maintains excellent SPO2 on room air, and is in no apparent distress.  No leukocytosis.  Benign abdominal exam. Patient improved through her ED course after a couple IV fluid boluses.  She had no vomiting throughout her ED course.  I suspect her ketonuria is due to dehydration.  Patient was able to tolerate PO fluids and would prefer to continue hydrating at home. The patient was given instructions for home care as well as return precautions. Patient voices understanding of these instructions, accepts the plan, and is comfortable with discharge.    Vitals:   08/13/19 1228 08/13/19 1433 08/13/19 1716  BP: (!) 153/82 (!) 144/92 (!) 159/93  Pulse: 65 69 67  Resp: 16 16 14   Temp: 98.3 F (36.8 C)    TempSrc: Oral    SpO2: 96% 100% 100%  Weight: 111.1 kg    Height: 5\' 3"  (1.6 m)        Lynn Wilcox 08/13/19 2333    Lynn Shanks, MD 08/22/19 1231

## 2019-08-13 NOTE — ED Triage Notes (Signed)
Pt reports ongoing emesis, upper abdominal pain. Taking home phenergan no relief of symptoms. Denies diarrhea, fever.

## 2019-08-13 NOTE — Discharge Instructions (Signed)
°

## 2019-08-13 NOTE — ED Provider Notes (Addendum)
[facility] DEPARTMENT Provider Note   CSN: [id number] Arrival date & time: 08/13/19  1223     History   Chief Complaint Chief Complaint  Patient presents with  . Emesis    HPI Lynn Wilcox is a 38 y.o. female past medical history of depression, asthma, GERD, migraines, obesity who presents for evaluation of persistent nausea/vomiting, upper abdominal pain.  Patient was seen here in the ED on 08/11/2019 for evaluation of same symptoms.  Work-up was reassuring.  CT scan showed possible infectious versus inflammatory colitis.  During her work-up, she was able to tolerate p.o.  At that time, it was thought that her symptoms are viral and did not need antibiotics.  She was discharged home with Phenergan.  Patient comes back in today for continued nausea/vomiting, abdominal pain.  She reports upper abdominal pain is intermittent and hurts worse when she vomits.  She reports she has been trying to take the Phenergan but is not been able to tolerate any p.o. today.  She reports approximate 12 episodes of nonbloody, nonbilious emesis.  No fevers, chills, diarrhea, urinary complaints, chest pain, difficulty breathing, cough.  She currently denies any abdominal pain at this time.     The history is provided by the patient.    Past Medical History:  Diagnosis Date  . Anxiety and depression 1995  . Arthritis 2014  . Asthma 1993   Uses inhaler prn  . H/O gastroesophageal reflux (GERD) 2013  . Migraines   . Obesity   . Seasonal allergies     Patient Active Problem List   Diagnosis Date Noted  . Mild intermittent asthma with acute exacerbation 11/07/2018  . Pain of joint of left ankle and foot 07/26/2018  . Lipoma of left forearm 06/18/2018  . Encounter for preventive care 07/18/2017  . Right shoulder pain 07/18/2017  . Bilateral carpal tunnel syndrome 09/25/2013  . History of asthma 05/13/2013  . GERD (gastroesophageal reflux disease) 04/15/2013    Past  Surgical History:  Procedure Laterality Date  . BREAST BIOPSY     age 50  . WISDOM TOOTH EXTRACTION       OB History    Gravida  2   Para  2   Term  2   Preterm      AB      Living  2     SAB      TAB      Ectopic      Multiple      Live Births  2            Home Medications    Prior to Admission medications   Medication Sig Start Date End Date Taking? Authorizing Provider  albuterol (PROVENTIL HFA;VENTOLIN HFA) 108 (90 Base) MCG/ACT inhaler Inhale 2 puffs into the lungs every 6 (six) hours as needed for wheezing. 11/06/18   Shirley, [place], DO  cetirizine (ZYRTEC) 10 MG tablet TAKE 1 TABLET BY MOUTH DAILY 03/04/19   Rory Percy, DO  dicyclomine (BENTYL) 20 MG tablet Take 1 tablet (20 mg total) by mouth 2 (two) times daily. 08/13/19   Joy, Shawn C, PA-C  ibuprofen (ADVIL,MOTRIN) 600 MG tablet Take 1 tablet (600 mg total) by mouth every 6 (six) hours as needed for mild pain, moderate pain or cramping. 08/24/13   Roma Schanz, CNM  montelukast (SINGULAIR) 10 MG tablet Take 1 tablet (10 mg total) by mouth at bedtime. 11/07/18   Nuala Alpha, DO  predniSONE (DELTASONE)  20 MG tablet Take 2 tablets (40 mg total) by mouth daily with breakfast. 11/06/18   Shirley, [place], DO  promethazine (PHENERGAN) 25 MG suppository Place 1 suppository (25 mg total) rectally every 6 (six) hours as needed for nausea or vomiting. 08/13/19   Joy, Shawn C, PA-C  promethazine (PHENERGAN) 25 MG tablet Take 1 tablet (25 mg total) by mouth every 6 (six) hours as needed for nausea or vomiting. 08/11/19   Alveria Apley, PA-C    Family History Family History  Problem Relation Age of Onset  . Stroke Mother   . Hypertension Mother   . Hyperlipidemia Mother     Social History Social History   Tobacco Use  . Smoking status: Former Smoker    Types: Cigarettes    Quit date: 2003    Years since quitting: 17.8  . Smokeless tobacco: Never Used  Substance Use Topics  . Alcohol use:  No  . Drug use: No     Allergies   Chocolate, Coffee bean extract [coffea arabica], Soy allergy, Tea, and Yeast-related products   Review of Systems Review of Systems  Constitutional: Negative for fever.  Respiratory: Negative for cough and shortness of breath.   Cardiovascular: Negative for chest pain.  Gastrointestinal: Positive for abdominal pain, nausea and vomiting.  Genitourinary: Negative for dysuria and hematuria.  Neurological: Negative for headaches.  All other systems reviewed and are negative.    Physical Exam Updated Vital Signs BP (!) 159/93 (BP Location: Left Arm)   Pulse 67   Temp 98.3 F (36.8 C) (Oral)   Resp 14   Ht 5\' 3"  (1.6 m)   Wt 111.1 kg   LMP 08/07/2019   SpO2 100%   BMI 43.40 kg/m   Physical Exam Vitals signs and nursing note reviewed.  Constitutional:      Appearance: Normal appearance. She is well-developed.  HENT:     Head: Normocephalic and atraumatic.  Eyes:     General: Lids are normal.     Conjunctiva/sclera: Conjunctivae normal.     Pupils: Pupils are equal, round, and reactive to light.  Neck:     Musculoskeletal: Full passive range of motion without pain.  Cardiovascular:     Rate and Rhythm: Normal rate and regular rhythm.     Pulses: Normal pulses.     Heart sounds: Normal heart sounds. No murmur. No friction rub. No gallop.   Pulmonary:     Effort: Pulmonary effort is normal.     Breath sounds: Normal breath sounds.     Comments: Lungs clear to auscultation bilaterally.  Symmetric chest rise.  No wheezing, rales, rhonchi. Abdominal:     Palpations: Abdomen is soft. Abdomen is not rigid.     Tenderness: There is no abdominal tenderness. There is no right CVA tenderness, left CVA tenderness or guarding.     Comments: Abdomen is soft, non-distended, non-tender. No rigidity, No guarding. No peritoneal signs. No CVA tenderness noted bilaterally.   Musculoskeletal: Normal range of motion.  Skin:    General: Skin is warm  and dry.     Capillary Refill: Capillary refill takes less than 2 seconds.  Neurological:     Mental Status: She is alert and oriented to person, place, and time.  Psychiatric:        Speech: Speech normal.      ED Treatments / Results  Labs (all labs ordered are listed, but only abnormal results are displayed) Labs Reviewed  COMPREHENSIVE METABOLIC PANEL - Abnormal;  Notable for the following components:      Result Value   Potassium 3.1 (*)    Glucose, Bld 126 (*)    Total Protein 8.4 (*)    All other components within normal limits  CBC - Abnormal; Notable for the following components:   MCV 78.1 (*)    MCH 24.9 (*)    All other components within normal limits  URINALYSIS, ROUTINE W REFLEX MICROSCOPIC - Abnormal; Notable for the following components:   Ketones, ur 80 (*)    Protein, ur 30 (*)    Bacteria, UA RARE (*)    All other components within normal limits  LIPASE, BLOOD  I-STAT BETA HCG BLOOD, ED ([facility], [facility], [facility] ONLY)    EKG None  Radiology No results found.  Procedures Procedures (including critical care time)  Medications Ordered in ED Medications  sodium chloride flush (NS) 0.9 % injection 3 mL (3 mLs Intravenous Not Given 08/13/19 1444)  promethazine (PHENERGAN) injection 25 mg (25 mg Intravenous Given 08/13/19 1612)  sodium chloride 0.9 % bolus 1,000 mL (0 mLs Intravenous Stopped 08/13/19 2026)     Initial Impression / Assessment and Plan / ED Course  I have reviewed the triage vital signs and the nursing notes.  Pertinent labs & imaging results that were available during my care of the patient were reviewed by me and considered in my medical decision making (see chart for details).  Clinical Course as of Aug 13 741  Wed Aug 13, 2019  1731 Patient states she feels better.   [SJ]    Clinical Course User Index [SJ] Joy, Shawn C, PA-C       38 year old who presents for evaluation of nausea/vomiting, upper abdominal pain.  She was seen here on  11/02 for similar symptoms.  CT scan at that time showed inflammatory versus infectious colitis.  She had antiemetics and had improvement in symptoms and was discharged home.  Comes back in today for continued nausea/vomiting.  On exam, abdomen is benign.  No tenderness.  She states that her tenderness has improved.  Initially arrival, she is afebrile, nontoxic appearing.  Vitals are stable.  Plan to check labs.  At this time, no indication to repeat imaging.  I do not suspect hepatobiliary as a source of her symptoms.  CBC shows no leukocytosis.  Hemoglobin stable at 12.6.  Lipase unremarkable.  CMP shows potassium of 3.1. BUN and creatinine within normal limits.  I-STAT beta negative.  Patient signed out to Arlean Hopping, PA-C with re-evaluation and PO challenge pending.   Final Clinical Impressions(s) / ED Diagnoses   Final diagnoses:  Non-intractable vomiting with nausea, unspecified vomiting type    ED Discharge Orders         Ordered    promethazine (PHENERGAN) 25 MG suppository  Every 6 hours PRN     08/13/19 2058    dicyclomine (BENTYL) 20 MG tablet  2 times daily     08/13/19 2058           Volanda Napoleon, PA-C 08/13/19 1609    Volanda Napoleon, PA-C 08/14/19 0742    Little, Wenda Overland, MD 08/15/19 [phone]

## 2019-08-13 NOTE — ED Notes (Signed)
Urine sent

## 2019-08-13 NOTE — ED Notes (Signed)
Patient Alert and oriented to baseline. Stable and ambulatory to baseline. Patient verbalized understanding of the discharge instructions.  Patient belongings were taken by the patient.   

## 2019-08-13 NOTE — ED Notes (Signed)
Pt is drinking

## 2019-08-15 ENCOUNTER — Other Ambulatory Visit: Payer: Self-pay

## 2019-08-15 ENCOUNTER — Telehealth (INDEPENDENT_AMBULATORY_CARE_PROVIDER_SITE_OTHER): Payer: Medicaid Other | Admitting: Family Medicine

## 2019-08-15 DIAGNOSIS — R112 Nausea with vomiting, unspecified: Secondary | ICD-10-CM

## 2019-08-15 MED ORDER — ONDANSETRON 4 MG PO TBDP: 4.0000 mg | ORAL_TABLET | Freq: Three times a day (TID) | ORAL | 0 refills | Status: DC | PRN

## 2019-08-15 NOTE — Progress Notes (Signed)
[facility] Telemedicine Visit  Patient consented to have virtual visit. Method of visit: Video  Encounter participants: Patient: Lynn Wilcox - located at home Provider: Bonnita Hollow - located at office Others (if applicable): n/a  Chief Complaint: Vomiting  HPI: Patient reports several episodes of vomiting for the past week.  Patient was seen in ED on 11/2 and 11/4.  Her work-up was suggestive of inflammatory versus infectious colitis.  This was evidenced on ascending and transverse colonic inflammation from CT abdomen pelvis.  Remainder of work-up was mostly unremarkable including normal CBC, normal CMP, negative urine pregnancy test.  Patient did have some mild ketones on UA, likely due to dehydration from excessive vomiting.  Patient has been able to maintain some p.o. fluids on Phenergan, although she has had to use some suppositories.  This does make her sleepy.  Patient did not have a history of vomiting.  Patient endorses some mild diarrhea but mostly vomiting.  It has been NBNB.  Patient Lynn Wilcox is no blood in her stool.  Patient does not have any known contacts with similar issue.  Patient also endorses some abdominal cramping, this is improved with Bentyl.  ROS: per HPI  Pertinent PMHx: Noncontributory  Exam:  General: No acute distress Respiratory: Able to speak in full sentences without issue, normal work of breathing Skin: No rash on face Psych: Normal affect normal paced speech  Assessment/Plan: Vomiting with abdominal pain Work-up thus far is been supportive of most likely in infectious source.  But given the difficulty to control and duration, will refer to GI.  Will trial on Zofran during the day to reduce sedation effects of Phenergan.  Time spent during visit with patient: 15 minutes

## 2019-08-19 ENCOUNTER — Other Ambulatory Visit (INDEPENDENT_AMBULATORY_CARE_PROVIDER_SITE_OTHER): Payer: Medicaid Other

## 2019-08-19 ENCOUNTER — Ambulatory Visit: Payer: Medicaid Other | Admitting: Physician Assistant

## 2019-08-19 ENCOUNTER — Encounter: Payer: Self-pay | Admitting: Physician Assistant

## 2019-08-19 VITALS — BP 118/76 | HR 78 | Temp 98.3°F | Ht 63.0 in | Wt 230.0 lb

## 2019-08-19 DIAGNOSIS — K529 Noninfective gastroenteritis and colitis, unspecified: Secondary | ICD-10-CM

## 2019-08-19 DIAGNOSIS — E876 Hypokalemia: Secondary | ICD-10-CM

## 2019-08-19 DIAGNOSIS — R112 Nausea with vomiting, unspecified: Secondary | ICD-10-CM | POA: Diagnosis not present

## 2019-08-19 LAB — POTASSIUM: Potassium: 3 mEq/L — ABNORMAL LOW (ref 3.5–5.1)

## 2019-08-19 NOTE — Progress Notes (Signed)
Subjective:    Patient ID: Lynn Wilcox, female    DOB: 31-May-1981, 38 y.o.   MRN: [id number]  HPI Lynn Wilcox is a pleasant 38 year old African-American female, new to GI today, referred after 2 recent ER visits.  Her PCP is Shela Commons DO/[facility]. Patient is generally in good health, has not had any prior GI evaluation. She states that she had acute onset of symptoms on 08/11/2019 with a cold sweat, followed by nausea and then nausea and vomiting with multiple episodes, and eventually developing fairly intense crampy upper abdominal pain.  She never developed any fever or diarrhea.  She went to the emergency room when she was unable to keep down p.o.'s after several hours.  CT of the abdomen and pelvis on 08/11/2019 showed wall thickening of the ascending and transverse colon and a few scattered diverticuli, no adenopathy.  Findings were felt consistent with an inflammatory or infectious colitis. WBC 11.5, hemoglobin 12.3, chemistries pertinent for potassium of 3.1. Patient says she was given IV fluids and antiemetics, felt better and was discharged.  After she got, she continued to have trouble keeping down p.o.'s and the nausea medication she was given was not helpful so she returned to the ER on 08/13/2019.  She relates having a couple of bags of fluids and was given prescription for Phenergan.. She says her symptoms lasted for a total of about a week, was associated with significant fatigue.  She gradually improved and at this point is feeling much better.  She has not had any nausea vomiting or abdominal pain over the past 24 hours and has been able to eat solid food.  She actually ate [place] food yesterday and did fine.  She is no longer taking the antiemetic. Looking back she says that she drank a "bubble tea" from a [place] place at the mall several hours prior to becoming ill.  Her sons who are with her also drank the tea but did not develop any symptoms.  Review of Systems  Pertinent positive and negative review of systems were noted in the above HPI section.  All other review of systems was otherwise negative.  Outpatient Encounter Medications as of 08/19/2019  Medication Sig  . albuterol (PROVENTIL HFA;VENTOLIN HFA) 108 (90 Base) MCG/ACT inhaler Inhale 2 puffs into the lungs every 6 (six) hours as needed for wheezing.  . cetirizine (ZYRTEC) 10 MG tablet TAKE 1 TABLET BY MOUTH DAILY  . ibuprofen (ADVIL,MOTRIN) 600 MG tablet Take 1 tablet (600 mg total) by mouth every 6 (six) hours as needed for mild pain, moderate pain or cramping.  . montelukast (SINGULAIR) 10 MG tablet Take 1 tablet (10 mg total) by mouth at bedtime.  . [DISCONTINUED] dicyclomine (BENTYL) 20 MG tablet Take 1 tablet (20 mg total) by mouth 2 (two) times daily.  . [DISCONTINUED] ondansetron (ZOFRAN ODT) 4 MG disintegrating tablet Take 1 tablet (4 mg total) by mouth every 8 (eight) hours as needed for nausea or vomiting.  . [DISCONTINUED] predniSONE (DELTASONE) 20 MG tablet Take 2 tablets (40 mg total) by mouth daily with breakfast.  . [DISCONTINUED] promethazine (PHENERGAN) 25 MG suppository Place 1 suppository (25 mg total) rectally every 6 (six) hours as needed for nausea or vomiting.  . [DISCONTINUED] promethazine (PHENERGAN) 25 MG tablet Take 1 tablet (25 mg total) by mouth every 6 (six) hours as needed for nausea or vomiting.   No facility-administered encounter medications on file as of 08/19/2019.    Allergies  Allergen Reactions  .  Chocolate     Throat itches  . Coffee Bean Extract [Coffea Arabica]     Throat itches  . Soy Allergy     Throat itches  . Tea     Throat itches  . Yeast-Related Products     Throat itches   Patient Active Problem List   Diagnosis Date Noted  . Mild intermittent asthma with acute exacerbation 11/07/2018  . Pain of joint of left ankle and foot 07/26/2018  . Lipoma of left forearm 06/18/2018  . Encounter for preventive care 07/18/2017  . Right shoulder  pain 07/18/2017  . Bilateral carpal tunnel syndrome 09/25/2013  . History of asthma 05/13/2013  . GERD (gastroesophageal reflux disease) 04/15/2013   Social History   Socioeconomic History  . Marital status: Single    Spouse name: Not on file  . Number of children: Not on file  . Years of education: Not on file  . Highest education level: Not on file  Occupational History  . Not on file  Social Needs  . Financial resource strain: Not on file  . Food insecurity    Worry: Not on file    Inability: Not on file  . Transportation needs    Medical: Not on file    Non-medical: Not on file  Tobacco Use  . Smoking status: Former Smoker    Types: Cigarettes    Quit date: 2003    Years since quitting: 17.8  . Smokeless tobacco: Never Used  Substance and Sexual Activity  . Alcohol use: Yes    Comment: occasioanlly  . Drug use: No  . Sexual activity: Yes    Partners: Male    Birth control/protection: Condom  Lifestyle  . Physical activity    Days per week: Not on file    Minutes per session: Not on file  . Stress: Not on file  Relationships  . Social Herbalist on phone: Not on file    Gets together: Not on file    Attends religious service: Not on file    Active member of club or organization: Not on file    Attends meetings of clubs or organizations: Not on file    Relationship status: Not on file  . Intimate partner violence    Fear of current or ex partner: Not on file    Emotionally abused: Not on file    Physically abused: Not on file    Forced sexual activity: Not on file  Other Topics Concern  . Not on file  Social History Narrative  . Not on file    Ms. Ranganathan's family history includes Hyperlipidemia in her mother; Hypertension in her mother; Stroke in her mother.      Objective:    Vitals:   08/19/19 1443  BP: 118/76  Pulse: 78  Temp: 98.3 F (36.8 C)    Physical Exam Well-developed well-nourished young African-American  no acute  distress.  Pleasant height, Weight, 230 BMI 40.7  HEENT; nontraumatic normocephalic, EOMI, PER R LA, sclera anicteric. Oropharynx; not examined/mask/Covid Neck; supple, no JVD Cardiovascular; regular rate and rhythm with S1-S2, no murmur rub or gallop Pulmonary; Clear bilaterally Abdomen; soft, nontender, nondistended, no palpable mass or hepatosplenomegaly, bowel sounds are active Rectal; not done Skin; benign exam, no jaundice rash or appreciable lesions Extremities; no clubbing cyanosis or edema skin warm and dry Neuro/Psych; alert and oriented x4, grossly nonfocal mood and affect appropriate       Assessment & Plan:   #  92 38 year old African-American female with acute likely infectious colitis-question foodborne, self-limited with abrupt onset of upper abdominal pain nausea and vomiting and CT findings of wall thickening of the ascending and transverse colon.  Patient symptoms have completely resolved and she is feeling well.  #2 diverticulosis #3 hypokalemia  Plan; we discussed the natural history of infectious colitis.  I do not think she needs any further GI work-up at this time.  She is advised to come back in for follow-up should she develop any recurrence of symptoms. We will check potassium level today, to assure corrected. Discussed initiating colon cancer screening at age 67. Patient will be established with Dr. Tarri Glenn, and I am happy to see her back in follow-up at any point.  Lynn Wilcox Genia Harold PA-C 08/19/2019   Cc: Leeanne Rio, MD

## 2019-08-19 NOTE — Patient Instructions (Addendum)
Your provider has requested that you go to the basement level for lab work before leaving today. Press "B" on the elevator. The lab is located at the first door on the left as you exit the elevator.  Please call our office should you have any recurrent symptoms.  If you are age 36 or older, your body mass index should be between 23-30. Your Body mass index is 40.74 kg/m. If this is out of the aforementioned range listed, please consider follow up with your Primary Care Provider.  If you are age 23 or younger, your body mass index should be between 19-25. Your Body mass index is 40.74 kg/m. If this is out of the aformentioned range listed, please consider follow up with your Primary Care Provider.

## 2019-08-20 ENCOUNTER — Telehealth: Payer: Self-pay | Admitting: Physician Assistant

## 2019-08-20 ENCOUNTER — Other Ambulatory Visit: Payer: Self-pay

## 2019-08-20 MED ORDER — POTASSIUM CHLORIDE CRYS ER 20 MEQ PO TBCR: 20.0000 meq | EXTENDED_RELEASE_TABLET | Freq: Two times a day (BID) | ORAL | 0 refills | Status: DC

## 2019-08-20 NOTE — Telephone Encounter (Signed)
Pt returned your call, pls call her again.  °

## 2019-08-20 NOTE — Telephone Encounter (Signed)
No answer x2 

## 2019-08-20 NOTE — Progress Notes (Signed)
Reviewed and agree with management plans. ? ?Lynn Wilcox L. Tyrina Hines, MD, MPH  ?

## 2020-02-09 ENCOUNTER — Emergency Department (HOSPITAL_COMMUNITY)
Admission: EM | Admit: 2020-02-09 | Discharge: 2020-02-09 | Disposition: A | Discharge status: Home / Self Care | Payer: Medicaid Other | Attending: Emergency Medicine | Admitting: Emergency Medicine

## 2020-02-09 ENCOUNTER — Other Ambulatory Visit: Payer: Self-pay

## 2020-02-09 ENCOUNTER — Encounter (HOSPITAL_COMMUNITY): Payer: Self-pay

## 2020-02-09 DIAGNOSIS — R112 Nausea with vomiting, unspecified: Secondary | ICD-10-CM | POA: Diagnosis not present

## 2020-02-09 DIAGNOSIS — Z87891 Personal history of nicotine dependence: Secondary | ICD-10-CM | POA: Insufficient documentation

## 2020-02-09 DIAGNOSIS — J45909 Unspecified asthma, uncomplicated: Secondary | ICD-10-CM | POA: Diagnosis not present

## 2020-02-09 DIAGNOSIS — Z79899 Other long term (current) drug therapy: Secondary | ICD-10-CM | POA: Insufficient documentation

## 2020-02-09 LAB — CBC
HCT: 37.6 % (ref 36.0–46.0)
Hemoglobin: 11.8 g/dL — ABNORMAL LOW (ref 12.0–15.0)
MCH: 24 pg — ABNORMAL LOW (ref 26.0–34.0)
MCHC: 31.4 g/dL (ref 30.0–36.0)
MCV: 76.6 fL — ABNORMAL LOW (ref 80.0–100.0)
Platelets: 293 10*3/uL (ref 150–400)
RBC: 4.91 MIL/uL (ref 3.87–5.11)
RDW: 15.5 % (ref 11.5–15.5)
WBC: 10.3 10*3/uL (ref 4.0–10.5)
nRBC: 0 % (ref 0.0–0.2)

## 2020-02-09 LAB — COMPREHENSIVE METABOLIC PANEL
ALT: 20 U/L (ref 0–44)
AST: 19 U/L (ref 15–41)
Albumin: 3.6 g/dL (ref 3.5–5.0)
Alkaline Phosphatase: 53 U/L (ref 38–126)
Anion gap: 10 (ref 5–15)
BUN: 7 mg/dL (ref 6–20)
CO2: 24 mmol/L (ref 22–32)
Calcium: 9.5 mg/dL (ref 8.9–10.3)
Chloride: 104 mmol/L (ref 98–111)
Creatinine, Ser: 0.87 mg/dL (ref 0.44–1.00)
GFR calc Af Amer: >60 mL/min (ref >60)
GFR calc non Af Amer: >60 mL/min (ref >60)
Glucose, Bld: 144 mg/dL — ABNORMAL HIGH (ref 70–99)
Potassium: 3.5 mmol/L (ref 3.5–5.1)
Sodium: 138 mmol/L (ref 135–145)
Total Bilirubin: 0.6 mg/dL (ref 0.3–1.2)
Total Protein: 7.9 g/dL (ref 6.5–8.1)

## 2020-02-09 LAB — URINALYSIS, ROUTINE W REFLEX MICROSCOPIC
Bilirubin Urine: NEGATIVE
Glucose, UA: NEGATIVE mg/dL
Ketones, ur: 5 mg/dL — AB
Leukocytes,Ua: NEGATIVE
Nitrite: NEGATIVE
Protein, ur: NEGATIVE mg/dL
Specific Gravity, Urine: 1.016 (ref 1.005–1.030)
pH: 8 (ref 5.0–8.0)

## 2020-02-09 LAB — LIPASE, BLOOD: Lipase: 21 U/L (ref 11–51)

## 2020-02-09 LAB — I-STAT BETA HCG BLOOD, ED (MC, WL, AP ONLY): I-stat hCG, quantitative: <5 m[IU]/mL (ref <5)

## 2020-02-09 MED ORDER — DIPHENHYDRAMINE HCL 50 MG/ML IJ SOLN
25.0000 mg | Freq: Once | INTRAMUSCULAR | Status: AC
  Administered 2020-02-09: 20:00:00 25 mg via INTRAVENOUS
  Filled 2020-02-09: qty 1

## 2020-02-09 MED ORDER — ONDANSETRON 4 MG PO TBDP
4.0000 mg | ORAL_TABLET | Freq: Three times a day (TID) | ORAL | 0 refills | Status: DC | PRN
Start: 2020-02-09 — End: 2020-05-13

## 2020-02-09 MED ORDER — ONDANSETRON 4 MG PO TBDP
4.0000 mg | ORAL_TABLET | Freq: Once | ORAL | Status: AC | PRN
  Administered 2020-02-09: 16:00:00 4 mg via ORAL
  Filled 2020-02-09: qty 1

## 2020-02-09 MED ORDER — METOCLOPRAMIDE HCL 5 MG/ML IJ SOLN
10.0000 mg | Freq: Once | INTRAMUSCULAR | Status: AC
  Administered 2020-02-09: 23:00:00 10 mg via INTRAVENOUS
  Filled 2020-02-09: qty 2

## 2020-02-09 MED ORDER — ALUM & MAG HYDROXIDE-SIMETH 200-200-20 MG/5ML PO SUSP
30.0000 mL | Freq: Once | ORAL | Status: AC
  Administered 2020-02-09: 30 mL via ORAL
  Filled 2020-02-09: qty 30

## 2020-02-09 MED ORDER — LACTATED RINGERS IV BOLUS
1000.0000 mL | Freq: Once | INTRAVENOUS | Status: AC
  Administered 2020-02-09: 20:00:00 1000 mL via INTRAVENOUS

## 2020-02-09 MED ORDER — LIDOCAINE VISCOUS HCL 2 % MT SOLN
15.0000 mL | Freq: Once | OROMUCOSAL | Status: AC
  Administered 2020-02-09: 23:00:00 15 mL via ORAL
  Filled 2020-02-09: qty 15

## 2020-02-09 MED ORDER — FAMOTIDINE 20 MG PO TABS
20.0000 mg | ORAL_TABLET | Freq: Once | ORAL | Status: AC
  Administered 2020-02-09: 23:00:00 20 mg via ORAL
  Filled 2020-02-09: qty 1

## 2020-02-09 MED ORDER — LACTATED RINGERS IV BOLUS
500.0000 mL | Freq: Once | INTRAVENOUS | Status: AC
  Administered 2020-02-09: 23:00:00 500 mL via INTRAVENOUS

## 2020-02-09 MED ORDER — PROCHLORPERAZINE EDISYLATE 10 MG/2ML IJ SOLN
10.0000 mg | Freq: Once | INTRAMUSCULAR | Status: AC
  Administered 2020-02-09: 20:00:00 10 mg via INTRAVENOUS
  Filled 2020-02-09: qty 2

## 2020-02-09 MED ORDER — FAMOTIDINE 20 MG PO TABS
20.0000 mg | ORAL_TABLET | Freq: Two times a day (BID) | ORAL | 0 refills | Status: DC
Start: 2020-02-09 — End: 2020-07-19

## 2020-02-09 NOTE — ED Provider Notes (Signed)
[facility] EMERGENCY DEPARTMENT Provider Note   CSN: [id number] Arrival date & time: 02/09/20  1600     History Chief Complaint  Patient presents with  . Abdominal Pain  . Emesis    Lynn Wilcox is a 39 y.o. female.  HPI Patient presents for abdominal pain and vomiting since this morning.  Recent history includes the following: She was out in the sun all day yesterday.  She did not drink much water.  Today, around 11:30 AM, she began vomiting.  She has since been unable to tolerate any p.o. intake, liquids or solids.  She has had onset of epigastric pain after the start of the vomiting.  Since the onset of the vomiting, she has felt chills.  She denies any known fevers at home.  Last bowel movement was this morning and was normal.  Patient denies any medical problems.  She states that she has had symptoms like this before and at the time, she was told she was dehydrated.  She was given IV fluids and an unknown medication for the "burning in her intestine".  She kept taking this medication for a week.  She no longer takes this medication.  She smokes marijuana occasionally, but did smoke a lot this weekend.  She does not use any other drugs.  She occasionally drinks alcohol.  She has not had any alcohol consumption.    Past Medical History:  Diagnosis Date  . Anxiety and depression 1995  . Arthritis 2014  . Asthma 1993   Uses inhaler prn  . H/O gastroesophageal reflux (GERD) 2013  . Migraines   . Obesity   . Seasonal allergies     Patient Active Problem List   Diagnosis Date Noted  . Mild intermittent asthma with acute exacerbation 11/07/2018  . Pain of joint of left ankle and foot 07/26/2018  . Lipoma of left forearm 06/18/2018  . Encounter for preventive care 07/18/2017  . Right shoulder pain 07/18/2017  . Bilateral carpal tunnel syndrome 09/25/2013  . History of asthma 05/13/2013  . GERD (gastroesophageal reflux disease) 04/15/2013    Past Surgical  History:  Procedure Laterality Date  . BREAST BIOPSY     age 52  . WISDOM TOOTH EXTRACTION       OB History    Gravida  2   Para  2   Term  2   Preterm      AB      Living  2     SAB      TAB      Ectopic      Multiple      Live Births  2           Family History  Problem Relation Age of Onset  . Stroke Mother   . Hypertension Mother   . Hyperlipidemia Mother   . Colon cancer Neg Hx   . Stomach cancer Neg Hx   . Esophageal cancer Neg Hx     Social History   Tobacco Use  . Smoking status: Former Smoker    Types: Cigarettes    Quit date: 2003    Years since quitting: 18.3  . Smokeless tobacco: Never Used  Substance Use Topics  . Alcohol use: Yes    Comment: occasioanlly  . Drug use: No    Home Medications Prior to Admission medications   Medication Sig Start Date End Date Taking? Authorizing Provider  albuterol (PROVENTIL HFA;VENTOLIN HFA) 108 (90 Base) MCG/ACT inhaler Inhale  2 puffs into the lungs every 6 (six) hours as needed for wheezing. 11/06/18  Yes Enid Derry, [place], DO  cetirizine (ZYRTEC) 10 MG tablet TAKE 1 TABLET BY MOUTH DAILY Patient taking differently: Take 10 mg by mouth daily.  03/04/19  Yes Rumball, Bryson Ha, DO  montelukast (SINGULAIR) 10 MG tablet Take 1 tablet (10 mg total) by mouth at bedtime. Patient taking differently: Take 10 mg by mouth at bedtime as needed (allergies).  11/07/18  Yes Lockamy, Timothy, DO  famotidine (PEPCID) 20 MG tablet Take 1 tablet (20 mg total) by mouth 2 (two) times daily. 02/09/20   Godfrey Pick, MD  ibuprofen (ADVIL,MOTRIN) 600 MG tablet Take 1 tablet (600 mg total) by mouth every 6 (six) hours as needed for mild pain, moderate pain or cramping. Patient not taking: Reported on 02/09/2020 08/24/13   Roma Schanz, CNM  ondansetron (ZOFRAN ODT) 4 MG disintegrating tablet Take 1 tablet (4 mg total) by mouth every 8 (eight) hours as needed for up to 20 doses for nausea or vomiting. 02/09/20   Godfrey Pick, MD    potassium chloride SA (KLOR-CON) 20 MEQ tablet Take 1 tablet (20 mEq total) by mouth 2 (two) times daily for 6 days. Patient not taking: Reported on 02/09/2020 08/20/19 02/09/20  Esterwood, Amy S, PA-C    Allergies    Chocolate, Coffee bean extract [coffea arabica], Soy allergy, Tea, and Yeast-related products  Review of Systems   Review of Systems  Constitutional: Positive for activity change, appetite change and chills. Negative for fever.  HENT: Negative for congestion, ear pain, rhinorrhea and sore throat.   Eyes: Negative for pain and visual disturbance.  Respiratory: Negative for cough, chest tightness, shortness of breath and wheezing.   Cardiovascular: Negative for chest pain and palpitations.  Gastrointestinal: Positive for abdominal pain and nausea. Negative for abdominal distention, constipation, diarrhea and vomiting.  Genitourinary: Negative for dysuria, hematuria and pelvic pain.  Musculoskeletal: Negative for arthralgias, back pain, myalgias and neck pain.  Skin: Negative for color change and rash.  Neurological: Positive for light-headedness. Negative for dizziness, seizures, syncope, weakness, numbness and headaches.  Hematological: Does not bruise/bleed easily.  Psychiatric/Behavioral: Negative.   All other systems reviewed and are negative.   Physical Exam Updated Vital Signs BP 135/62 (BP Location: Right Arm)   Pulse 72   Temp 98.4 F (36.9 C)   Resp 16   Ht 5\' 3"  (1.6 m)   Wt 81.6 kg   SpO2 96%   BMI 31.89 kg/m   Physical Exam Vitals and nursing note reviewed.  Constitutional:      General: She is not in acute distress.    Appearance: She is well-developed. She is obese. She is not ill-appearing, toxic-appearing or diaphoretic.  HENT:     Head: Normocephalic and atraumatic.     Mouth/Throat:     Mouth: Mucous membranes are moist.     Pharynx: Oropharynx is clear.  Eyes:     General: No scleral icterus.    Conjunctiva/sclera: Conjunctivae normal.   Cardiovascular:     Rate and Rhythm: Normal rate and regular rhythm.     Heart sounds: No murmur.  Pulmonary:     Effort: Pulmonary effort is normal. No respiratory distress.     Breath sounds: Normal breath sounds. No wheezing, rhonchi or rales.  Abdominal:     General: Bowel sounds are normal. There is no distension.     Palpations: Abdomen is soft. There is no fluid wave or hepatomegaly.  Tenderness: There is abdominal tenderness in the epigastric area. There is no right CVA tenderness, left CVA tenderness, guarding or rebound. Negative signs include Murphy's sign.     Hernia: No hernia is present.  Musculoskeletal:     Cervical back: Neck supple.  Skin:    General: Skin is warm and dry.     Capillary Refill: Capillary refill takes less than 2 seconds.  Neurological:     General: No focal deficit present.     Mental Status: She is alert and oriented to person, place, and time.     Cranial Nerves: No cranial nerve deficit.     Motor: No weakness.  Psychiatric:        Mood and Affect: Mood normal.        Behavior: Behavior normal.     ED Results / Procedures / Treatments   Labs (all labs ordered are listed, but only abnormal results are displayed) Labs Reviewed  COMPREHENSIVE METABOLIC PANEL - Abnormal; Notable for the following components:      Result Value   Glucose, Bld 144 (*)    All other components within normal limits  CBC - Abnormal; Notable for the following components:   Hemoglobin 11.8 (*)    MCV 76.6 (*)    MCH 24.0 (*)    All other components within normal limits  URINALYSIS, ROUTINE W REFLEX MICROSCOPIC - Abnormal; Notable for the following components:   Color, Urine STRAW (*)    Hgb urine dipstick SMALL (*)    Ketones, ur 5 (*)    Bacteria, UA RARE (*)    All other components within normal limits  LIPASE, BLOOD  I-STAT BETA HCG BLOOD, ED ([facility], [facility], [facility] ONLY)    EKG None  Radiology No results found.  Procedures Procedures (including critical  care time)  Medications Ordered in ED Medications  ondansetron (ZOFRAN-ODT) disintegrating tablet 4 mg (4 mg Oral Given 02/09/20 1613)  lactated ringers bolus 1,000 mL (0 mLs Intravenous Stopped 02/09/20 2109)  prochlorperazine (COMPAZINE) injection 10 mg (10 mg Intravenous Given 02/09/20 2018)  diphenhydrAMINE (BENADRYL) injection 25 mg (25 mg Intravenous Given 02/09/20 2018)  metoCLOPramide (REGLAN) injection 10 mg (10 mg Intravenous Given 02/09/20 2243)  lactated ringers bolus 500 mL (0 mLs Intravenous Stopped 02/09/20 2322)  alum & mag hydroxide-simeth (MAALOX/MYLANTA) 200-200-20 MG/5ML suspension 30 mL (30 mLs Oral Given 02/09/20 2311)    And  lidocaine (XYLOCAINE) 2 % viscous mouth solution 15 mL (15 mLs Oral Given 02/09/20 2311)  famotidine (PEPCID) tablet 20 mg (20 mg Oral Given 02/09/20 2311)    ED Course  I have reviewed the triage vital signs and the nursing notes.  Pertinent labs & imaging results that were available during my care of the patient were reviewed by me and considered in my medical decision making (see chart for details).    MDM Rules/Calculators/A&P                      Patient is a 39 year old female who presents for intractable nausea and vomiting for the past 8 hours.  Since that time, she also has epigastric pain and has felt chills.  Arrival in the ED, she is afebrile with normal vital signs.  She is well-appearing.  Abdomen is soft.  She endorses epigastric pain to deep palpation.  There are no other areas of pain or tenderness.  There is no rebound.    She reports that she has not been able to tolerate any  p.o. intake, even liquids, since noon.  She also has a history of similar presentations in the past.  Per chart review, this occurred in November.  At that time, she was diagnosed with gastroenteritis and given Phenergan for symptomatic relief.  She returned the next day, at which time CT scan imaging showed inflammatory versus infectious colitis.  She improved with  antiemetics and was discharged home.  She denies any further episodes until now.  Given timeline and exam findings of soft abdomen without rebound or significant areas of tenderness, no imaging is currently indicated.  Patient was given IV fluids and Zofran.  Upon reassessment, patient continued to endorse nausea.  Compazine and Benadryl were given.  Upon further reassessment, patient stated that "I feel amazing".  Patient was given water and juice for p.o. challenge.  She took approximately one sip of each and subsequently vomited.  Additional 500 cc of IVF was ordered.  Patient was given Reglan.  Following third antiemetic (Reglan), patient was given GI cocktail of viscous lidocaine, Pepcid, and Maalox.  She reported symptomatic relief with a GI cocktail.  She was able to tolerate p.o. intake.  She stated that she was ready to go home.  30-day supply of Pepcid was prescribed.  Additionally, Zofran was prescribed to take and as needed.  She was advised to follow-up with her doctor sometime this week and to return to the ED if she again became unable to tolerate p.o. intake.  She was discharged home in stable condition.   Final Clinical Impression(s) / ED Diagnoses Final diagnoses:  Nausea and vomiting, intractability of vomiting not specified, unspecified vomiting type    Rx / DC Orders ED Discharge Orders         Ordered    famotidine (PEPCID) 20 MG tablet  2 times daily     02/09/20 2346    ondansetron (ZOFRAN ODT) 4 MG disintegrating tablet  Every 8 hours PRN     02/09/20 2346           Godfrey Pick, MD 02/10/20 [place], [place], DO 02/10/20 1521

## 2020-02-09 NOTE — ED Notes (Signed)
Patient verbalizes understanding of discharge instructions. Opportunity for questioning and answers were provided. Armband removed by staff, pt discharged from ED stable & ambulatory  

## 2020-02-09 NOTE — ED Triage Notes (Signed)
Pt reports abd pain and emesis since 11am this morning, pt currently on her menstrual cycle.

## 2020-02-09 NOTE — Discharge Instructions (Addendum)
Follow-up with your primary care doctor sometime this week.  Take Pepcid, as prescribed, twice a day.  Take Zofran, only as needed for symptomatic relief of nausea and/or vomiting.  Return to the ED if you are unable to tolerate drinking fluids.

## 2020-02-09 NOTE — ED Notes (Signed)
Pt resting comfortably at this time, denies N/V currently. No additional requests at this time.

## 2020-02-09 NOTE — ED Notes (Signed)
Pt reminded of need for urine specimen, unable to provide at this time.

## 2020-02-09 NOTE — ED Notes (Signed)
Per MD Dixon, pt failed PO challenge, vomited juice & water when offered.

## 2020-02-09 NOTE — ED Notes (Signed)
Pt expressed feeling "so much better" after GI cocktail, states that lidocaine is "doing its job of numbing." Pt to be discharged home

## 2020-04-16 ENCOUNTER — Other Ambulatory Visit: Payer: Self-pay

## 2020-04-16 ENCOUNTER — Emergency Department (HOSPITAL_COMMUNITY)
Admission: EM | Admit: 2020-04-16 | Discharge: 2020-04-16 | Disposition: A | Discharge status: Home / Self Care | Payer: Medicaid Other | Attending: Emergency Medicine | Admitting: Emergency Medicine

## 2020-04-16 ENCOUNTER — Encounter (HOSPITAL_COMMUNITY): Payer: Self-pay | Admitting: Emergency Medicine

## 2020-04-16 DIAGNOSIS — R1110 Vomiting, unspecified: Secondary | ICD-10-CM | POA: Diagnosis present

## 2020-04-16 DIAGNOSIS — J45909 Unspecified asthma, uncomplicated: Secondary | ICD-10-CM | POA: Insufficient documentation

## 2020-04-16 DIAGNOSIS — K2970 Gastritis, unspecified, without bleeding: Secondary | ICD-10-CM

## 2020-04-16 DIAGNOSIS — Z87891 Personal history of nicotine dependence: Secondary | ICD-10-CM | POA: Diagnosis not present

## 2020-04-16 DIAGNOSIS — Z800 Family history of malignant neoplasm of digestive organs: Secondary | ICD-10-CM | POA: Diagnosis not present

## 2020-04-16 DIAGNOSIS — R112 Nausea with vomiting, unspecified: Secondary | ICD-10-CM

## 2020-04-16 LAB — I-STAT BETA HCG BLOOD, ED (MC, WL, AP ONLY): I-stat hCG, quantitative: <5 m[IU]/mL (ref <5)

## 2020-04-16 LAB — CBC WITH DIFFERENTIAL/PLATELET
Abs Immature Granulocytes: 0.03 10*3/uL (ref 0.00–0.07)
Basophils Absolute: 0 10*3/uL (ref 0.0–0.1)
Basophils Relative: 0 %
Eosinophils Absolute: 0 10*3/uL (ref 0.0–0.5)
Eosinophils Relative: 0 %
HCT: 39.3 % (ref 36.0–46.0)
Hemoglobin: 12.8 g/dL (ref 12.0–15.0)
Immature Granulocytes: 0 %
Lymphocytes Relative: 13 %
Lymphs Abs: 1.3 10*3/uL (ref 0.7–4.0)
MCH: 25 pg — ABNORMAL LOW (ref 26.0–34.0)
MCHC: 32.6 g/dL (ref 30.0–36.0)
MCV: 76.8 fL — ABNORMAL LOW (ref 80.0–100.0)
Monocytes Absolute: 0.7 10*3/uL (ref 0.1–1.0)
Monocytes Relative: 7 %
Neutro Abs: 7.9 10*3/uL — ABNORMAL HIGH (ref 1.7–7.7)
Neutrophils Relative %: 80 %
Platelets: 300 10*3/uL (ref 150–400)
RBC: 5.12 MIL/uL — ABNORMAL HIGH (ref 3.87–5.11)
RDW: 14.8 % (ref 11.5–15.5)
WBC: 9.9 10*3/uL (ref 4.0–10.5)
nRBC: 0 % (ref 0.0–0.2)

## 2020-04-16 LAB — LIPASE, BLOOD: Lipase: 25 U/L (ref 11–51)

## 2020-04-16 LAB — BASIC METABOLIC PANEL
Anion gap: 12 (ref 5–15)
BUN: 7 mg/dL (ref 6–20)
CO2: 23 mmol/L (ref 22–32)
Calcium: 9.5 mg/dL (ref 8.9–10.3)
Chloride: 103 mmol/L (ref 98–111)
Creatinine, Ser: 0.88 mg/dL (ref 0.44–1.00)
GFR calc Af Amer: >60 mL/min (ref >60)
GFR calc non Af Amer: >60 mL/min (ref >60)
Glucose, Bld: 128 mg/dL — ABNORMAL HIGH (ref 70–99)
Potassium: 3.2 mmol/L — ABNORMAL LOW (ref 3.5–5.1)
Sodium: 138 mmol/L (ref 135–145)

## 2020-04-16 MED ORDER — LIDOCAINE VISCOUS HCL 2 % MT SOLN
30.0000 mL | Freq: Once | OROMUCOSAL | Status: AC
  Administered 2020-04-16: 30 mL via OROMUCOSAL
  Filled 2020-04-16: qty 30

## 2020-04-16 MED ORDER — LACTATED RINGERS IV BOLUS
1000.0000 mL | Freq: Once | INTRAVENOUS | Status: AC
  Administered 2020-04-16: 1000 mL via INTRAVENOUS

## 2020-04-16 MED ORDER — PANTOPRAZOLE SODIUM 40 MG PO TBEC
40.0000 mg | DELAYED_RELEASE_TABLET | Freq: Once | ORAL | Status: AC
  Administered 2020-04-16: 40 mg via ORAL
  Filled 2020-04-16: qty 1

## 2020-04-16 MED ORDER — ALUM & MAG HYDROXIDE-SIMETH 200-200-20 MG/5ML PO SUSP
30.0000 mL | Freq: Once | ORAL | Status: AC
  Administered 2020-04-16: 30 mL via ORAL
  Filled 2020-04-16: qty 30

## 2020-04-16 MED ORDER — METOCLOPRAMIDE HCL 10 MG PO TABS
10.0000 mg | ORAL_TABLET | Freq: Three times a day (TID) | ORAL | 0 refills | Status: DC | PRN
Start: 2020-04-16 — End: 2021-11-21

## 2020-04-16 MED ORDER — METOCLOPRAMIDE HCL 5 MG/ML IJ SOLN
10.0000 mg | Freq: Once | INTRAMUSCULAR | Status: AC
  Administered 2020-04-16: 10 mg via INTRAVENOUS
  Filled 2020-04-16: qty 2

## 2020-04-16 MED ORDER — OMEPRAZOLE 20 MG PO CPDR
20.0000 mg | DELAYED_RELEASE_CAPSULE | Freq: Every day | ORAL | 0 refills | Status: DC
Start: 2020-04-16 — End: 2020-05-13

## 2020-04-16 NOTE — ED Provider Notes (Signed)
[facility] EMERGENCY DEPARTMENT Provider Note   CSN: [id number] Arrival date & time: 04/16/20  4580     History Chief Complaint  Patient presents with  . Emesis    Lynn Wilcox is a 39 y.o. female.  States he was in [place] recently and went to the emergency room because of vomiting after eating spicy chicken sandwich.  It improved to their but then she missed her morning medications and outs come back in she comes here for further evaluation.  No fevers.  She has some epigastric discomfort.  She states that when she was here back in May it was similar and they were GI cocktail and seemed to help.  She was diagnosed with what sounds like indigestion at that time.  No sick contacts.  No other associated symptoms.   Emesis      Past Medical History:  Diagnosis Date  . Anxiety and depression 1995  . Arthritis 2014  . Asthma 1993   Uses inhaler prn  . H/O gastroesophageal reflux (GERD) 2013  . Migraines   . Obesity   . Seasonal allergies     Patient Active Problem List   Diagnosis Date Noted  . Mild intermittent asthma with acute exacerbation 11/07/2018  . Pain of joint of left ankle and foot 07/26/2018  . Lipoma of left forearm 06/18/2018  . Encounter for preventive care 07/18/2017  . Right shoulder pain 07/18/2017  . Bilateral carpal tunnel syndrome 09/25/2013  . History of asthma 05/13/2013  . GERD (gastroesophageal reflux disease) 04/15/2013    Past Surgical History:  Procedure Laterality Date  . BREAST BIOPSY     age 42  . WISDOM TOOTH EXTRACTION       OB History    Gravida  2   Para  2   Term  2   Preterm      AB      Living  2     SAB      TAB      Ectopic      Multiple      Live Births  2           Family History  Problem Relation Age of Onset  . Stroke Mother   . Hypertension Mother   . Hyperlipidemia Mother   . Colon cancer Neg Hx   . Stomach cancer Neg Hx   . Esophageal cancer Neg Hx       Social History   Tobacco Use  . Smoking status: Former Smoker    Types: Cigarettes    Quit date: 2003    Years since quitting: 18.5  . Smokeless tobacco: Never Used  Vaping Use  . Vaping Use: Never used  Substance Use Topics  . Alcohol use: Yes    Comment: occasioanlly  . Drug use: No    Home Medications Prior to Admission medications   Medication Sig Start Date End Date Taking? Authorizing Provider  albuterol (PROVENTIL HFA;VENTOLIN HFA) 108 (90 Base) MCG/ACT inhaler Inhale 2 puffs into the lungs every 6 (six) hours as needed for wheezing. 11/06/18   Shirley, [place], DO  cetirizine (ZYRTEC) 10 MG tablet TAKE 1 TABLET BY MOUTH DAILY Patient taking differently: Take 10 mg by mouth daily.  03/04/19   Rory Percy, DO  famotidine (PEPCID) 20 MG tablet Take 1 tablet (20 mg total) by mouth 2 (two) times daily. 02/09/20   Godfrey Pick, MD  ibuprofen (ADVIL,MOTRIN) 600 MG tablet Take 1 tablet (600  mg total) by mouth every 6 (six) hours as needed for mild pain, moderate pain or cramping. Patient not taking: Reported on 02/09/2020 08/24/13   Roma Schanz, CNM  montelukast (SINGULAIR) 10 MG tablet Take 1 tablet (10 mg total) by mouth at bedtime. Patient taking differently: Take 10 mg by mouth at bedtime as needed (allergies).  11/07/18   Lockamy, Timothy, DO  ondansetron (ZOFRAN ODT) 4 MG disintegrating tablet Take 1 tablet (4 mg total) by mouth every 8 (eight) hours as needed for up to 20 doses for nausea or vomiting. 02/09/20   Godfrey Pick, MD  potassium chloride SA (KLOR-CON) 20 MEQ tablet Take 1 tablet (20 mEq total) by mouth 2 (two) times daily for 6 days. Patient not taking: Reported on 02/09/2020 08/20/19 02/09/20  Esterwood, Amy S, PA-C    Allergies    Chocolate, Coffee bean extract [coffea arabica], Soy allergy, Tea, and Yeast-related products  Review of Systems   Review of Systems  Gastrointestinal: Positive for vomiting.  All other systems reviewed and are  negative.   Physical Exam Updated Vital Signs BP (!) 154/91   Pulse 89   Temp 98.7 F (37.1 C)   Resp 19   Ht 5\' 3"  (1.6 m)   Wt 80 kg   LMP 03/31/2020   SpO2 97%   BMI 31.24 kg/m   Physical Exam Vitals and nursing note reviewed.  Constitutional:      Appearance: She is well-developed.  HENT:     Head: Normocephalic and atraumatic.     Nose: No congestion or rhinorrhea.     Mouth/Throat:     Mouth: Mucous membranes are moist.     Pharynx: Oropharynx is clear.  Eyes:     Pupils: Pupils are equal, round, and reactive to light.  Cardiovascular:     Rate and Rhythm: Normal rate and regular rhythm.  Pulmonary:     Effort: No respiratory distress.     Breath sounds: No stridor.  Abdominal:     General: There is no distension.  Musculoskeletal:        General: No swelling. Normal range of motion.     Cervical back: Normal range of motion.  Skin:    General: Skin is warm and dry.     Coloration: Skin is not jaundiced or pale.  Neurological:     General: No focal deficit present.     Mental Status: She is alert.     ED Results / Procedures / Treatments   Labs (all labs ordered are listed, but only abnormal results are displayed) Labs Reviewed  CBC WITH DIFFERENTIAL/PLATELET - Abnormal; Notable for the following components:      Result Value   RBC 5.12 (*)    MCV 76.8 (*)    MCH 25.0 (*)    Neutro Abs 7.9 (*)    All other components within normal limits  BASIC METABOLIC PANEL - Abnormal; Notable for the following components:   Potassium 3.2 (*)    Glucose, Bld 128 (*)    All other components within normal limits  LIPASE, BLOOD  URINALYSIS, ROUTINE W REFLEX MICROSCOPIC  I-STAT BETA HCG BLOOD, ED ([facility], [facility], [facility] ONLY)    EKG None  Radiology No results found.  Procedures Procedures (including critical care time)  Medications Ordered in ED Medications  lactated ringers bolus 1,000 mL (has no administration in time range)  metoCLOPramide (REGLAN) injection  10 mg (has no administration in time range)  alum & mag hydroxide-simeth (MAALOX/MYLANTA)  200-200-20 MG/5ML suspension 30 mL (has no administration in time range)  lidocaine (XYLOCAINE) 2 % viscous mouth solution 30 mL (has no administration in time range)    ED Course  I have reviewed the triage vital signs and the nursing notes.  Pertinent labs & imaging results that were available during my care of the patient were reviewed by me and considered in my medical decision making (see chart for details).    MDM Rules/Calculators/A&P                          Labs reassuring. Mucous membranes slightly dry. HR slightly high (not tachy). Will give gi cocktail/reglan/fluids and reeval for disposition. No indication for imaging at this time. Care transferred pending reeval for ability to tolerate PO.   Final Clinical Impression(s) / ED Diagnoses Final diagnoses:  None    Rx / DC Orders ED Discharge Orders    None       Duayne Brideau, Corene Cornea, MD 04/19/20 1157

## 2020-04-16 NOTE — ED Triage Notes (Signed)
Patient arrived with EMS from home reports persistent nausea and emesis for 2 days , denies abdominal pain , no fever or chills .

## 2020-04-16 NOTE — Discharge Instructions (Signed)
The work-up in the ER is reassuring Take the nausea medication as needed along with omeprazole for possible gastritis.  Follow up with your primary care doctor for further evaluation.

## 2020-04-16 NOTE — ED Notes (Signed)
Pt given gingerale and crackers for PO challenge

## 2020-04-26 ENCOUNTER — Ambulatory Visit: Payer: Medicaid Other | Admitting: Family Medicine

## 2020-04-26 ENCOUNTER — Other Ambulatory Visit: Payer: Self-pay

## 2020-04-26 NOTE — Progress Notes (Deleted)
    SUBJECTIVE:   CHIEF COMPLAINT / HPI:   ER follow-up-nausea/vomiting Patient is a 39 year old female the presents today for follow-up after being evaluated in the emergency department earlier this month for nausea and vomiting.  PERTINENT  PMH / PSH: ***  OBJECTIVE:   LMP 03/31/2020    General: NAD, pleasant, able to participate in exam Cardiac: RRR, no murmurs. Respiratory: CTAB, normal effort, No wheezes, rales or rhonchi Abdomen: Bowel sounds present, nontender, nondistended, no hepatosplenomegaly. Extremities: no edema or cyanosis. Skin: warm and dry, no rashes noted Neuro: alert, no obvious focal deficits Psych: Normal affect and mood  ASSESSMENT/PLAN:   No problem-specific Assessment & Plan notes found for this encounter.     Lurline Del, [place]

## 2020-04-28 ENCOUNTER — Ambulatory Visit: Payer: Medicaid Other

## 2020-05-09 HISTORY — PX: ESOPHAGOGASTRODUODENOSCOPY: SHX1529

## 2020-05-13 ENCOUNTER — Encounter: Payer: Self-pay | Admitting: Nurse Practitioner

## 2020-05-13 ENCOUNTER — Ambulatory Visit: Payer: Medicaid Other | Admitting: Nurse Practitioner

## 2020-05-13 ENCOUNTER — Other Ambulatory Visit (INDEPENDENT_AMBULATORY_CARE_PROVIDER_SITE_OTHER): Payer: Medicaid Other

## 2020-05-13 VITALS — BP 120/82 | HR 80 | Ht 63.0 in | Wt 240.5 lb

## 2020-05-13 DIAGNOSIS — R112 Nausea with vomiting, unspecified: Secondary | ICD-10-CM

## 2020-05-13 DIAGNOSIS — K5900 Constipation, unspecified: Secondary | ICD-10-CM | POA: Diagnosis not present

## 2020-05-13 DIAGNOSIS — R1013 Epigastric pain: Secondary | ICD-10-CM

## 2020-05-13 LAB — BASIC METABOLIC PANEL
BUN: 12 mg/dL (ref 6–23)
CO2: 26 mEq/L (ref 19–32)
Calcium: 9.2 mg/dL (ref 8.4–10.5)
Chloride: 103 mEq/L (ref 96–112)
Creatinine, Ser: 0.84 mg/dL (ref 0.40–1.20)
GFR: 91.96 mL/min (ref >60.00)
Glucose, Bld: 107 mg/dL — ABNORMAL HIGH (ref 70–99)
Potassium: 3.6 mEq/L (ref 3.5–5.1)
Sodium: 135 mEq/L (ref 135–145)

## 2020-05-13 LAB — C-REACTIVE PROTEIN: CRP: 1.1 mg/dL (ref 0.5–20.0)

## 2020-05-13 MED ORDER — HYOSCYAMINE SULFATE 0.125 MG SL SUBL
0.1250 mg | SUBLINGUAL_TABLET | Freq: Four times a day (QID) | SUBLINGUAL | 0 refills | Status: DC | PRN
Start: 2020-05-13 — End: 2021-11-21

## 2020-05-13 NOTE — Progress Notes (Signed)
05/13/2020 Lynn Wilcox [id number] [birth year]   Chief Complaint:  Episodes of sweats, upper abdominal pain and vomiting   History of Present Illness: Lynn Wilcox is a 39 year old female with a past medical history of anxiety, arthritis, asthma, migraine headaches and GERD. She presents today for further evaluation regarding episodes of profuse sweating followed by vomiting and generalized abdominal pain which has occurred approximately every 2 to 3 months for the past year. No diarrhea or unusual bowel pattern during these episodes. No specific food or stress triggers. Following these episodes, she "can't eat for one week".  The most recent episode occurred after she ate scallops that were undercooked and later ate a spicy chicken sandwich at [facility] which resulted in vomiting with sweats early the next morning 04/16/2020. She presented to [facility] ER for further evaluation. Labs in the ED showed a potassium level of 3.2. She received LR 1 L IV, Reglan and a GI cocktail and her symptoms improved. She was discharged home on Reglan 10mg  po Q 8 hrs PRN and Omeprazole 20mg  daily.    In review of her records, she was also seen at [facility] ED on 02/09/2020 with epigastric pain and vomiting after being outside in the sun the previous day. She received IV fluids, Compazine, Bendaryl, Pepcid and a GI cocktail and her symptoms resolved. She was discharged home on Pepcid 20mg  po bid and Zofran 4mg  ODT Q 8 hrs PRN. She was seen in the ED 08/13/2019 with cold sweats, upper abdominal pain, nausea and vomiting.  CT of the abdomen and pelvis on 08/11/2019 showed wall thickening of the ascending and transverse colon and a few scattered diverticuli, no adenopathy.  Findings were felt consistent with an inflammatory or infectious colitis. WBC 11.5, hemoglobin 12.3 and potassium level was 3.1. She received IV fluids, antiemetics and she was discharged home. She was initially seen in our office by Nicoletta Ba PA-C  on 08/19/2019 for further follow up. She was assessed to mostly likely have infectious colitis, no further GI evaluation was required at that time as her symptoms resolved.   Currently, she feels well. No nausea or vomiting. No upper or lower abdominal pain. She typically passes a normal brown BM most days. She has some constipation at times, passing smaller stools.  No rectal bleeding or black stools. She denies taking NSAIDS. No recent antibiotics. She is not taking Famotidine. No family history of IBD or colorectal cancer.    CBC Latest Ref Rng & Units 04/16/2020 02/09/2020 08/13/2019  WBC 4.0 - 10.5 K/uL 9.9 10.3 8.6  Hemoglobin 12.0 - 15.0 g/dL 12.8 11.8(L) 12.6  Hematocrit 36 - 46 % 39.3 37.6 39.6  Platelets 150 - 400 K/uL 300 293 316   CMP Latest Ref Rng & Units 04/16/2020 02/09/2020 08/19/2019  Glucose 70 - 99 mg/dL 128(H) 144(H) -  BUN 6 - 20 mg/dL 7 7 -  Creatinine 0.44 - 1.00 mg/dL 0.88 0.87 -  Sodium 135 - 145 mmol/L 138 138 -  Potassium 3.5 - 5.1 mmol/L 3.2(L) 3.5 3.0(L)  Chloride 98 - 111 mmol/L 103 104 -  CO2 22 - 32 mmol/L 23 24 -  Calcium 8.9 - 10.3 mg/dL 9.5 9.5 -  Total Protein 6.5 - 8.1 g/dL - 7.9 -  Total Bilirubin 0.3 - 1.2 mg/dL - 0.6 -  Alkaline Phos 38 - 126 U/L - 53 -  AST 15 - 41 U/L - 19 -  ALT 0 - 44 U/L - 20 -  Current Outpatient Medications on File Prior to Visit  Medication Sig Dispense Refill  . albuterol (PROVENTIL HFA;VENTOLIN HFA) 108 (90 Base) MCG/ACT inhaler Inhale 2 puffs into the lungs every 6 (six) hours as needed for wheezing. 1 Inhaler 2  . cetirizine (ZYRTEC) 10 MG tablet TAKE 1 TABLET BY MOUTH DAILY 90 tablet 3  . famotidine (PEPCID) 20 MG tablet Take 1 tablet (20 mg total) by mouth 2 (two) times daily. 30 tablet 0  . ibuprofen (ADVIL,MOTRIN) 600 MG tablet Take 1 tablet (600 mg total) by mouth every 6 (six) hours as needed for mild pain, moderate pain or cramping. 30 tablet 0  . metoCLOPramide (REGLAN) 10 MG tablet Take 1 tablet (10 mg total) by  mouth every 8 (eight) hours as needed for nausea or vomiting. 15 tablet 0   No current facility-administered medications on file prior to visit.   Allergies  Allergen Reactions  . Chocolate Other (See Comments)    Throat itches  . Coffee Bean Extract [Coffea Arabica] Other (See Comments)    Throat itches  . Soy Allergy Other (See Comments)    Throat itches  . Tea Other (See Comments)    Throat itches  . Yeast-Related Products Other (See Comments)    Throat itches     Current Medications, Allergies, Past Medical History, Past Surgical History, Family History and Social History were reviewed in [employer] record.   Physical Exam: BP 120/82   Pulse 80   Ht 5\' 3"  (1.6 m)   Wt 240 lb 8 oz (109.1 kg)   BMI 42.60 kg/m  General: Well developed 39 year old female in no acute distress. Head: Normocephalic and atraumatic. Eyes: No scleral icterus. Conjunctiva pink . Ears: Normal auditory acuity. Mouth: Dentition intact. No ulcers or lesions.  Lungs: Clear throughout to auscultation. Heart: Regular rate and rhythm, no murmur. Abdomen: Soft, nontender and nondistended. No masses or hepatomegaly. Normal bowel sounds x 4 quadrants.  Rectal: Deferred.  Musculoskeletal: Symmetrical with no gross deformities. Extremities: No edema. Neurological: Alert oriented x 4. No focal deficits.  Psychological: Alert and cooperative. Normal mood and affect  Assessment and Recommendations:  48. 39 year old female with episodic N/V, epigastric pain with associated sweats  -Patient to call office if symptoms recur, recommend stat abd/pelvic CT angiogram with contrast at time of next attack -EGD benefits and risks discussed including risk with sedation, risk of bleeding, perforation and infection -Abdominal sonogram to assess the gallbladder -Hyoscyamine 0.125mg  one tab SL Q 6 to 8 hrs PRN for abdominal pain  -CRP, BMP  2. CTAP 11/20202 showed wall thickening to the ascending  and transverse colon and a few scattered diverticula, findings consistent with inflammatory or infectious colitis -Consider scheduling a diagnostic colonoscopy at the time of her EGD, defer further colonoscopy recommendations to Dr. Tarri Glenn  3. Constipation -Miralax Q HS

## 2020-05-13 NOTE — Patient Instructions (Addendum)
If you are age 38 or older, your body mass index should be between 23-30. Your Body mass index is 42.6 kg/m. If this is out of the aforementioned range listed, please consider follow up with your Primary Care Provider.  If you are age 47 or younger, your body mass index should be between 19-25. Your Body mass index is 42.6 kg/m. If this is out of the aformentioned range listed, please consider follow up with your Primary Care Provider.   Your provider has requested that you go to the basement level for lab work before leaving today. Press "B" on the elevator. The lab is located at the first door on the left as you exit the elevator.  We have sent the following medications to your pharmacy for you to pick up at your convenience: Hyoscyamine 0.125 mg sublingual every 6 hours  Take Miralax 1 capful mixed in 8 ounces of water at bed time for constipation as tolerated.  You have been scheduled for an abdominal ultrasound at  [facility] on  05/20/2020 @ 9:30 am. Please arrive 15 minutes prior to your appointment for registration. Make certain not to have anything to eat or drink 8 hours prior to your appointment. Should you need to reschedule your appointment, please contact radiology at [phone]. This test typically takes about 30 minutes to perform.   Due to recent changes in healthcare laws, you may see the results of your imaging and laboratory studies on MyChart before your provider has had a chance to review them.  We understand that in some cases there may be results that are confusing or concerning to you. Not all laboratory results come back in the same time frame and the provider may be waiting for multiple results in order to interpret others.  Please give [name] 48 hours in order for your provider to thoroughly review all the results before contacting the office for clarification of your results.   Thank you for choosing [facility] Noralyn Pick, CRNP

## 2020-05-18 LAB — GI PROFILE, STOOL, PCR

## 2020-05-20 ENCOUNTER — Ambulatory Visit (HOSPITAL_COMMUNITY)
Admission: RE | Admit: 2020-05-20 | Discharge: 2020-05-20 | Disposition: A | Discharge status: Home / Self Care | Payer: Medicaid Other | Source: Ambulatory Visit | Attending: Nurse Practitioner | Admitting: Nurse Practitioner

## 2020-05-20 ENCOUNTER — Other Ambulatory Visit: Payer: Self-pay

## 2020-05-20 DIAGNOSIS — R112 Nausea with vomiting, unspecified: Secondary | ICD-10-CM | POA: Insufficient documentation

## 2020-05-20 DIAGNOSIS — K5900 Constipation, unspecified: Secondary | ICD-10-CM | POA: Diagnosis present

## 2020-05-20 DIAGNOSIS — R1013 Epigastric pain: Secondary | ICD-10-CM | POA: Insufficient documentation

## 2020-05-27 ENCOUNTER — Ambulatory Visit (INDEPENDENT_AMBULATORY_CARE_PROVIDER_SITE_OTHER): Payer: Medicaid Other

## 2020-05-27 ENCOUNTER — Other Ambulatory Visit: Payer: Self-pay | Admitting: Gastroenterology

## 2020-05-27 DIAGNOSIS — Z1159 Encounter for screening for other viral diseases: Secondary | ICD-10-CM

## 2020-05-27 LAB — SARS CORONAVIRUS 2 (TAT 6-24 HRS): SARS Coronavirus 2: NEGATIVE

## 2020-05-31 ENCOUNTER — Encounter: Payer: Self-pay | Admitting: Gastroenterology

## 2020-05-31 ENCOUNTER — Other Ambulatory Visit: Payer: Self-pay

## 2020-05-31 ENCOUNTER — Ambulatory Visit (AMBULATORY_SURGERY_CENTER): Payer: Medicaid Other | Admitting: Gastroenterology

## 2020-05-31 VITALS — BP 133/77 | HR 73 | Temp 97.1°F | Resp 13 | Ht 63.0 in | Wt 240.0 lb

## 2020-05-31 DIAGNOSIS — K449 Diaphragmatic hernia without obstruction or gangrene: Secondary | ICD-10-CM | POA: Diagnosis not present

## 2020-05-31 DIAGNOSIS — K2970 Gastritis, unspecified, without bleeding: Secondary | ICD-10-CM

## 2020-05-31 DIAGNOSIS — K2100 Gastro-esophageal reflux disease with esophagitis, without bleeding: Secondary | ICD-10-CM

## 2020-05-31 DIAGNOSIS — K209 Esophagitis, unspecified without bleeding: Secondary | ICD-10-CM

## 2020-05-31 DIAGNOSIS — K2990 Gastroduodenitis, unspecified, without bleeding: Secondary | ICD-10-CM

## 2020-05-31 DIAGNOSIS — R112 Nausea with vomiting, unspecified: Secondary | ICD-10-CM

## 2020-05-31 DIAGNOSIS — K2950 Unspecified chronic gastritis without bleeding: Secondary | ICD-10-CM | POA: Diagnosis not present

## 2020-05-31 MED ORDER — PANTOPRAZOLE SODIUM 40 MG PO TBEC: 40.0000 mg | DELAYED_RELEASE_TABLET | Freq: Every day | ORAL | 0 refills | Status: DC

## 2020-05-31 MED ORDER — SODIUM CHLORIDE 0.9 % IV SOLN: 500.0000 mL | Freq: Once | INTRAVENOUS | Status: DC

## 2020-05-31 NOTE — Progress Notes (Signed)
VS by CW. ?

## 2020-05-31 NOTE — Patient Instructions (Signed)
HANDOUTS GIVEN:  GASTRITIS, ESOPHAGITIS RESUME PREVIOUS DIET CONTINUE PRESENT MEDICATIONS NO ASPIRIN, IBUPROFEN, NAPROXEN OR OTHER NSAIDS START PANTOPRAZOLE 40MG  EVERY MORNING WITH 8 OZ WATER 30 MINUTES BEFORE EATING FOR 8 WEEKS AWAIT PATHOLOGY RESULTS RETURN TO GI OFFICE IN 6-8 WEEKS OR EARLIER IF NEEDED   YOU HAD AN ENDOSCOPIC PROCEDURE TODAY AT [place]:   Refer to the procedure report that was given to you for any specific questions about what was found during the examination.  If the procedure report does not answer your questions, please call your gastroenterologist to clarify.  If you requested that your care partner not be given the details of your procedure findings, then the procedure report has been included in a sealed envelope for you to review at your convenience later.  YOU SHOULD EXPECT: Some feelings of bloating in the abdomen. Passage of more gas than usual.  Walking can help get rid of the air that was put into your GI tract during the procedure and reduce the bloating. If you had a lower endoscopy (such as a colonoscopy or flexible sigmoidoscopy) you may notice spotting of blood in your stool or on the toilet paper. If you underwent a bowel prep for your procedure, you may not have a normal bowel movement for a few days.  Please Note:  You might notice some irritation and congestion in your nose or some drainage.  This is from the oxygen used during your procedure.  There is no need for concern and it should clear up in a day or so.  SYMPTOMS TO REPORT IMMEDIATELY:   Following upper endoscopy (EGD)  Vomiting of blood or coffee ground material  New chest pain or pain under the shoulder blades  Painful or persistently difficult swallowing  New shortness of breath  Fever of 100F or higher  Black, tarry-looking stools  For urgent or emergent issues, a gastroenterologist can be reached at any hour by calling [phone]. Do not use MyChart messaging  for urgent concerns.    DIET:  We do recommend a small meal at first, but then you may proceed to your regular diet.  Drink plenty of fluids but you should avoid alcoholic beverages for 24 hours.  ACTIVITY:  You should plan to take it easy for the rest of today and you should NOT DRIVE or use heavy machinery until tomorrow (because of the sedation medicines used during the test).    FOLLOW UP: Our staff will call the number listed on your records 48-72 hours following your procedure to check on you and address any questions or concerns that you may have regarding the information given to you following your procedure. If we do not reach you, we will leave a message.  We will attempt to reach you two times.  During this call, we will ask if you have developed any symptoms of COVID 19. If you develop any symptoms (ie: fever, flu-like symptoms, shortness of breath, cough etc.) before then, please call [phone].  If you test positive for Covid 19 in the 2 weeks post procedure, please call and report this information to [name].    If any biopsies were taken you will be contacted by phone or by letter within the next 1-3 weeks.  Please call us at [phone] if you have not heard about the biopsies in 3 weeks.    SIGNATURES/CONFIDENTIALITY: You and/or your care partner have signed paperwork which will be entered into your electronic medical record.  These signatures  attest to the fact that that the information above on your After Visit Summary has been reviewed and is understood.  Full responsibility of the confidentiality of this discharge information lies with you and/or your care-partner.

## 2020-05-31 NOTE — Progress Notes (Signed)
Called to room to assist during endoscopic procedure.  Patient ID and intended procedure confirmed with present staff. Received instructions for my participation in the procedure from the performing physician.  

## 2020-05-31 NOTE — Progress Notes (Signed)
Reviewed and agree with management plans. Plan colonoscopy if EGD is nondiagnostic.  Ashely Goosby L. Tarri Glenn, MD, MPH

## 2020-05-31 NOTE — Op Note (Signed)
[facility] Patient Name: Lynn Wilcox Procedure Date: 05/31/2020 9:04 AM MRN: [id number] Endoscopist: Thornton Park MD, MD Age: 39 Referring MD:  Date of Birth: Oct 31, 1980 Gender: Female Account #: [id number] Procedure:                Upper GI endoscopy Indications:              Epigastric abdominal pain, Nausea with vomiting Medicines:                Monitored Anesthesia Care Procedure:                Pre-Anesthesia Assessment:                           - Prior to the procedure, a History and Physical                            was performed, and patient medications and                            allergies were reviewed. The patient's tolerance of                            previous anesthesia was also reviewed. The risks                            and benefits of the procedure and the sedation                            options and risks were discussed with the patient.                            All questions were answered, and informed consent                            was obtained. Prior Anticoagulants: The patient has                            taken no previous anticoagulant or antiplatelet                            agents. ASA Grade Assessment: III - A patient with                            severe systemic disease. After reviewing the risks                            and benefits, the patient was deemed in                            satisfactory condition to undergo the procedure.                           After obtaining informed consent, the endoscope was  passed under direct vision. Throughout the                            procedure, the patient's blood pressure, pulse, and                            oxygen saturations were monitored continuously. The                            Endoscope was introduced through the mouth, and                            advanced to the third part of duodenum. The upper                            GI  endoscopy was accomplished without difficulty.                            The patient tolerated the procedure well. Scope In: Scope Out: Findings:                 LA Grade A (one or more mucosal breaks less than 5                            mm, not extending between tops of 2 mucosal folds)                            esophagitis with no bleeding was found 38 cm from                            the incisors. Biopsies were taken from the                            mid/proximal and distal esophagus with a cold                            forceps for histology. Estimated blood loss was                            minimal.                           Diffuse mildly erythematous mucosa without bleeding                            was found in the gastric antrum. Biopsies were                            taken from the antrum, body, and fundus with a cold                            forceps for histology. Estimated blood loss was  minimal.                           Patchy mildly erythematous mucosa was found in the                            duodenal bulb. Biopsies were taken with a cold                            forceps for histology. Estimated blood loss was                            minimal.                           The cardia and gastric fundus were normal on                            retroflexion.                           A small hiatal hernia is present. The exam was                            otherwise without abnormality. Complications:            No immediate complications. Estimated blood loss:                            Minimal. Estimated Blood Loss:     Estimated blood loss was minimal. Impression:               - LA Grade A reflux esophagitis with no bleeding.                            Biopsied.                           - Erythematous mucosa in the antrum. Biopsied.                           - Small hiatal hernia.                           - Erythematous  duodenopathy. Biopsied.                           - The examination was otherwise normal. Recommendation:           - Patient has a contact number available for                            emergencies. The signs and symptoms of potential                            delayed complications were discussed with the  patient. Return to normal activities tomorrow.                            Written discharge instructions were provided to the                            patient.                           - Resume previous diet.                           - Continue present medications.                           - No aspirin, ibuprofen, naproxen, or other                            non-steroidal anti-inflammatory drugs.                           - Start pantoprazole 40 mg QAM x 8 weeks.                           - Await pathology results.                           - Return to my office in 6-8 weeks, earlier if                            needed. Thornton Park MD, MD 05/31/2020 9:32:15 AM This report has been signed electronically.

## 2020-05-31 NOTE — Progress Notes (Signed)
PT taken to PACU. Monitors in place. VSS. Report given to RN. 

## 2020-06-02 ENCOUNTER — Telehealth: Payer: Self-pay | Admitting: *Deleted

## 2020-06-02 ENCOUNTER — Telehealth: Payer: Self-pay

## 2020-06-02 NOTE — Telephone Encounter (Signed)
Message left

## 2020-06-02 NOTE — Telephone Encounter (Signed)
Left message on follow up call. 

## 2020-07-06 ENCOUNTER — Other Ambulatory Visit: Payer: Self-pay | Admitting: Family Medicine

## 2020-07-08 ENCOUNTER — Encounter: Payer: Self-pay | Admitting: Gastroenterology

## 2020-07-08 ENCOUNTER — Telehealth: Payer: Self-pay | Admitting: Nurse Practitioner

## 2020-07-08 NOTE — Telephone Encounter (Signed)
Error

## 2020-07-08 NOTE — Telephone Encounter (Signed)
Returned patients call and asked her to contact the office.

## 2020-07-09 MED ORDER — PANTOPRAZOLE SODIUM 40 MG PO TBEC
40.0000 mg | DELAYED_RELEASE_TABLET | Freq: Every day | ORAL | 0 refills | Status: DC
Start: 2020-07-09 — End: 2020-07-19

## 2020-07-09 NOTE — Telephone Encounter (Signed)
The patient states she was unable to pick up the medication which was prescribed for her reflux due to cost. Is insured now and desires the prescription to be sent in. Verified pharmacy.

## 2020-07-09 NOTE — Telephone Encounter (Signed)
Returning your call. Was working yesterday and is very sorry she missed your call. Promises to answer today. Please call her at [phone].

## 2020-07-12 ENCOUNTER — Other Ambulatory Visit: Payer: Self-pay

## 2020-07-12 DIAGNOSIS — Z8709 Personal history of other diseases of the respiratory system: Secondary | ICD-10-CM

## 2020-07-12 MED ORDER — ALBUTEROL SULFATE HFA 108 (90 BASE) MCG/ACT IN AERS: 2.0000 | INHALATION_SPRAY | Freq: Four times a day (QID) | RESPIRATORY_TRACT | 0 refills | Status: DC | PRN

## 2020-07-16 ENCOUNTER — Encounter: Payer: Self-pay | Admitting: *Deleted

## 2020-07-19 ENCOUNTER — Ambulatory Visit: Payer: Medicaid Other | Admitting: Gastroenterology

## 2020-07-19 ENCOUNTER — Encounter: Payer: Self-pay | Admitting: Gastroenterology

## 2020-07-19 VITALS — BP 98/62 | HR 87 | Ht 63.0 in | Wt 242.1 lb

## 2020-07-19 DIAGNOSIS — K2970 Gastritis, unspecified, without bleeding: Secondary | ICD-10-CM | POA: Diagnosis not present

## 2020-07-19 DIAGNOSIS — K209 Esophagitis, unspecified without bleeding: Secondary | ICD-10-CM | POA: Diagnosis not present

## 2020-07-19 DIAGNOSIS — K2990 Gastroduodenitis, unspecified, without bleeding: Secondary | ICD-10-CM | POA: Diagnosis not present

## 2020-07-19 MED ORDER — PANTOPRAZOLE SODIUM 40 MG PO TBEC: 40.0000 mg | DELAYED_RELEASE_TABLET | Freq: Every day | ORAL | 3 refills | Status: DC

## 2020-07-19 MED ORDER — FAMOTIDINE 40 MG PO TABS: 40.0000 mg | ORAL_TABLET | Freq: Every day | ORAL | 0 refills | Status: DC

## 2020-07-19 NOTE — Patient Instructions (Addendum)
We have given you a handout on GERD to look over and follow.  ______________________________________________________  We have sent the following medications to your pharmacy for you to pick up at your convenience: Pantoprazole 40 mg twice daily x 8 weeks (we have given you additional medication in the case you find it is needed) ______________________________________________________  Please purchase the following medications over the counter and take as directed: famotidine 40 mg twice daily as needed for breakthrough reflux   ________________________________________________________  Follow up in the office with Dr Tarri Glenn in 1 year if needed.  ________________________________________________________  If you are age 23 or older, your body mass index should be between 23-30. Your Body mass index is 42.89 kg/m. If this is out of the aforementioned range listed, please consider follow up with your Primary Care Provider.  If you are age 47 or younger, your body mass index should be between 19-25. Your Body mass index is 42.89 kg/m. If this is out of the aformentioned range listed, please consider follow up with your Primary Care Provider.   ________________________________________________________  Due to recent changes in healthcare laws, you may see the results of your imaging and laboratory studies on MyChart before your provider has had a chance to review them.  We understand that in some cases there may be results that are confusing or concerning to you. Not all laboratory results come back in the same time frame and the provider may be waiting for multiple results in order to interpret others.  Please give [name] 48 hours in order for your provider to thoroughly review all the results before contacting the office for clarification of your results.

## 2020-07-19 NOTE — Progress Notes (Signed)
Referring Provider: Lurline Del, DO Primary Care Physician:  Lurline Del, DO  Chief complaint:  Vomiting   IMPRESSION:  LA reflux esophagitis with rare heartburn Intermittent abdominal pain and vomiting after an episode of sweating Intestinal metaplasia without risks Recent abnormal CT: mild diffuse thickening of the ascending and transverse colon    - no associated symptoms    - GI stool pathogen panel negative    - no abnormalities seen on colonoscopy Constipation  LA Reflux esophagitis: Recommend treatment for 8 weeks and then a taper trial. Will use famotidine BID PRN for breakthrough. Reviewed reflux lifestyle modifications.  Intermittent abdominal pain and vomiting: May be associated with reflux. If symptoms persistent despite a full course of treatment with PPI therapy, will proceed with evaluation for other causes, particularly given the abnormal CT scan in the past.  Intestinal metaplasia: Mapping not performed at the time of endoscopy. No H pylori on gastric biopsies. Not high risk for gastric cancer by family/social history. No smoking and moderation of alcohol recommended. Now threshold to repeat EGD with mapping with recurrent/persistent symptoms.    PLAN: Pantoprazole 40 mg BID x 8 weeks (1 refill) Famotidine 40 mg BID PRN breakthrough Reviewed reflux lifestyle modifications Follow-up PRN  Please see the "Patient Instructions" section for addition details about the plan.  HPI: Lynn Wilcox is a 39 y.o. female who returns in scheduled follow-up. She has anxiety, arthritis, asthma, migraine headaches and GERD. She presents today for further evaluation regarding episodes of profuse sweating followed by vomiting and generalized abdominal pain which has occurred approximately every 2 to 3 months for the past year. No diarrhea or unusual bowel pattern during these episodes. No specific food or stress triggers. Following these episodes, she "can't eat for one week".  The  most recent episode occurred after she ate scallops that were undercooked and later ate a spicy chicken sandwich at [facility] which resulted in vomiting with sweats early the next morning 04/16/2020. She presented to [facility] ER for further evaluation. Labs in the ED showed a potassium level of 3.2. She received LR 1 L IV, Reglan and a GI cocktail and her symptoms improved. She was discharged home on Reglan 10mg  po Q 8 hrs PRN and Omeprazole 20mg  daily.    In review of her records, she was also seen at [facility] ED on 02/09/2020 with epigastric pain and vomiting after being outside in the sun the previous day. She received IV fluids, Compazine, Bendaryl, Pepcid and a GI cocktail and her symptoms resolved. She was discharged home on Pepcid 20mg  po bid and Zofran 4mg  ODT Q 8 hrs PRN. She was seen in the ED 08/13/2019 with cold sweats, upper abdominal pain, nausea and vomiting. CT of the abdomen and pelvis on 08/11/2019 showed wall thickening of the ascending and transverse colon and a few scattered diverticuli, no adenopathy. Findings were felt consistent with an inflammatory or infectious colitis. WBC 11.5, hemoglobin 12.3 and potassium level was 3.1. She received IV fluids, antiemetics and she was discharged home. She was initially seen in our office by Nicoletta Ba PA-C on 08/19/2019 for further follow up. She was assessed to mostly likely have infectious colitis, no further GI evaluation was required at that time as her symptoms resolved.   She was seen by Carl Best 05/13/20 and reported feeling well without ongoing GI complaint except for some constipation at times, passing smaller stools.  No rectal bleeding or black stools. She denies taking NSAIDS. No recent antibiotics.  She is not taking Famotidine. No family history of IBD or colorectal cancer.   Evaluation has included:  Normal lipase and liver enzymes 04/16/20 Normal hemoglobin 04/16/20 Normal CRP 05/13/20 GI pathogen panel negative  05/14/20   EGD 05/31/20 showed: - LA Grade A esophagitis.  - Diffuse mildly erythematous mucosa without bleeding was found in the gastric antrum. - Patchy mildly erythematous mucosa was found in the duodenal bulb.  Pathology showed: 1. Surgical [P], duodenal bulb, 2nd portion of duodenum and distal duodenum - BENIGN DUODENAL MUCOSA. - NO FEATURES OF CELIAC SPRUE OR GRANULOMAS. 2. Surgical [P], fundus, gastric antrum and gastric body - ANTRAL AND OXYNTIC MUCOSA WITH MILD CHRONIC INFLAMMATION AND INTESTINAL METAPLASIA. - WARTHIN-STARRY NEGATIVE FOR HELICOBACTER PYLORI. - NO DYSPLASIA OR CARCINOMA. 3. Surgical [P], distal esophagus - BENIGN SQUAMOUS MUCOSA. - NO INTESTINAL METAPLASIA, DYSPLASIA OR CARCINOMA. 4. Surgical [P], mid and proximal esophagus - BENIGN SQUAMOUS MUCOSA. - NO EOSINOPHILIC ESOPHAGITIS (LESS THAN 5 PER HIGH POWER FIELD).  She returns today in follow-up. No further vomiting since June off of pantoprazole and famotidine. Although she notes that these episodes only occur every 6 months.   She has increased the water in her diet. Notes some abdominal pain associated with hunger. Resolves with eating. Following a anti-reflux diet.   No history of alcohol use or cigarette smoking. There is no known personal history of obesity, chronic atrophic gastritis, or pernicious anemia. 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 There is no known family history of gastric cancer.   No known family history of colon cancer or polyps. No family history of uterine/endometrial cancer, pancreatic cancer or gastric/stomach cancer.   Past Medical History:  Diagnosis Date  . Anxiety and depression 1995  . Arthritis 2014  . Asthma 1993   Uses inhaler prn  . Diverticulosis   . Esophagitis   . H/O gastroesophageal reflux (GERD) 2013  . Hiatal hernia   . Hypokalemia   . Migraines   .  Obesity   . Seasonal allergies     Past Surgical History:  Procedure Laterality Date  . BREAST BIOPSY     age 65  . WISDOM TOOTH EXTRACTION      Current Outpatient Medications  Medication Sig Dispense Refill  . albuterol (VENTOLIN HFA) 108 (90 Base) MCG/ACT inhaler Inhale 2 puffs into the lungs every 6 (six) hours as needed for wheezing. 8 g 0  . cetirizine (ZYRTEC) 10 MG tablet TAKE 1 TABLET BY MOUTH DAILY 90 tablet 3  . hyoscyamine (LEVSIN SL) 0.125 MG SL tablet Place 1 tablet (0.125 mg total) under the tongue every 6 (six) hours as needed. 30 tablet 0  . ibuprofen (ADVIL,MOTRIN) 600 MG tablet Take 1 tablet (600 mg total) by mouth every 6 (six) hours as needed for mild pain, moderate pain or cramping. 30 tablet 0  . metoCLOPramide (REGLAN) 10 MG tablet Take 1 tablet (10 mg total) by mouth every 8 (eight) hours as needed for nausea or vomiting. 15 tablet 0   No current facility-administered medications for this visit.    Allergies as of 07/19/2020 - Review Complete 07/19/2020  Allergen Reaction Noted  . Chocolate Other (See Comments) 12/20/2012  . Coffee bean extract [coffea arabica] Other (See Comments) 12/20/2012  . Soy allergy Other (See Comments) 12/20/2012  . Tea Other (See Comments) 12/20/2012  . Yeast-related products Other (See Comments) 12/20/2012    Family  History  Problem Relation Age of Onset  . Stroke Mother   . Hypertension Mother   . Hyperlipidemia Mother   . Colon cancer Neg Hx   . Stomach cancer Neg Hx   . Esophageal cancer Neg Hx     Social History   Socioeconomic History  . Marital status: Single    Spouse name: Not on file  . Number of children: Not on file  . Years of education: Not on file  . Highest education level: Not on file  Occupational History  . Not on file  Tobacco Use  . Smoking status: Former Smoker    Types: Cigarettes    Quit date: 2003    Years since quitting: 18.7  . Smokeless tobacco: Never Used  Vaping Use  . Vaping  Use: Never used  Substance and Sexual Activity  . Alcohol use: Yes    Comment: occasioanlly  . Drug use: No  . Sexual activity: Yes    Partners: Male    Birth control/protection: Condom  Other Topics Concern  . Not on file  Social History Narrative  . Not on file   Social Determinants of Health   Financial Resource Strain:   . Difficulty of Paying Living Expenses: Not on file  Food Insecurity:   . Worried About Charity fundraiser in the Last Year: Not on file  . Ran Out of Food in the Last Year: Not on file  Transportation Needs:   . Lack of Transportation (Medical): Not on file  . Lack of Transportation (Non-Medical): Not on file  Physical Activity:   . Days of Exercise per Week: Not on file  . Minutes of Exercise per Session: Not on file  Stress:   . Feeling of Stress : Not on file  Social Connections:   . Frequency of Communication with Friends and Family: Not on file  . Frequency of Social Gatherings with Friends and Family: Not on file  . Attends Religious Services: Not on file  . Active Member of Clubs or Organizations: Not on file  . Attends Archivist Meetings: Not on file  . Marital Status: Not on file  Intimate Partner Violence:   . Fear of Current or Ex-Partner: Not on file  . Emotionally Abused: Not on file  . Physically Abused: Not on file  . Sexually Abused: Not on file    Review of Systems: 12 system ROS is negative except as noted above.   Physical Exam: General:   Alert,  well-nourished, pleasant and cooperative in NAD Head:  Normocephalic and atraumatic. Eyes:  Sclera clear, no icterus.   Conjunctiva pink. Ears:  Normal auditory acuity. Nose:  No deformity, discharge,  or lesions. Mouth:  No deformity or lesions.   Neck:  Supple; no masses or thyromegaly. Lungs:  Clear throughout to auscultation.   No wheezes. Heart:  Regular rate and rhythm; no murmurs. Abdomen:  Soft,nontender, nondistended, normal bowel sounds, no rebound or  guarding. No hepatosplenomegaly.   Rectal:  Deferred  Msk:  Symmetrical. No boney deformities LAD: No inguinal or umbilical LAD Extremities:  No clubbing or edema. Neurologic:  Alert and  oriented x4;  grossly nonfocal Skin:  Intact without significant lesions or rashes. Psych:  Alert and cooperative. Normal mood and affect.    Jerrold Haskell L. Tarri Glenn, MD, MPH 07/19/2020, 2:11 PM

## 2020-07-22 ENCOUNTER — Encounter: Payer: Self-pay | Admitting: Gastroenterology

## 2020-08-08 ENCOUNTER — Other Ambulatory Visit: Payer: Self-pay | Admitting: Family Medicine

## 2020-08-08 DIAGNOSIS — Z8709 Personal history of other diseases of the respiratory system: Secondary | ICD-10-CM

## 2020-09-01 ENCOUNTER — Other Ambulatory Visit: Payer: Self-pay | Admitting: Family Medicine

## 2020-09-01 DIAGNOSIS — Z8709 Personal history of other diseases of the respiratory system: Secondary | ICD-10-CM

## 2021-06-15 ENCOUNTER — Other Ambulatory Visit: Payer: Self-pay

## 2021-06-15 ENCOUNTER — Encounter (HOSPITAL_COMMUNITY): Payer: Self-pay | Admitting: Emergency Medicine

## 2021-06-15 ENCOUNTER — Emergency Department (HOSPITAL_COMMUNITY): Payer: Medicaid Other

## 2021-06-15 ENCOUNTER — Emergency Department (HOSPITAL_COMMUNITY)
Admission: EM | Admit: 2021-06-15 | Discharge: 2021-06-16 | Disposition: A | Discharge status: Home / Self Care | Payer: Medicaid Other | Attending: Emergency Medicine | Admitting: Emergency Medicine

## 2021-06-15 DIAGNOSIS — M542 Cervicalgia: Secondary | ICD-10-CM | POA: Insufficient documentation

## 2021-06-15 DIAGNOSIS — Y92410 Unspecified street and highway as the place of occurrence of the external cause: Secondary | ICD-10-CM | POA: Diagnosis not present

## 2021-06-15 DIAGNOSIS — M25512 Pain in left shoulder: Secondary | ICD-10-CM | POA: Diagnosis present

## 2021-06-15 DIAGNOSIS — Z5321 Procedure and treatment not carried out due to patient leaving prior to being seen by health care provider: Secondary | ICD-10-CM | POA: Insufficient documentation

## 2021-06-15 MED ORDER — ACETAMINOPHEN 325 MG PO TABS: 650.0000 mg | ORAL_TABLET | Freq: Once | ORAL | Status: DC
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
## 2021-06-16 NOTE — ED Notes (Signed)
PT LEFT DUE TO LONG WAIT

## 2021-11-21 ENCOUNTER — Ambulatory Visit (INDEPENDENT_AMBULATORY_CARE_PROVIDER_SITE_OTHER): Payer: Medicaid Other | Admitting: Family Medicine

## 2021-11-21 ENCOUNTER — Encounter: Payer: Self-pay | Admitting: Family Medicine

## 2021-11-21 ENCOUNTER — Other Ambulatory Visit: Payer: Self-pay

## 2021-11-21 VITALS — BP 117/69 | HR 94 | Ht 63.0 in | Wt 220.2 lb

## 2021-11-21 DIAGNOSIS — B349 Viral infection, unspecified: Secondary | ICD-10-CM | POA: Diagnosis not present

## 2021-11-21 DIAGNOSIS — E871 Hypo-osmolality and hyponatremia: Secondary | ICD-10-CM | POA: Diagnosis not present

## 2021-11-21 DIAGNOSIS — J45909 Unspecified asthma, uncomplicated: Secondary | ICD-10-CM | POA: Diagnosis not present

## 2021-11-21 DIAGNOSIS — Z20822 Contact with and (suspected) exposure to covid-19: Secondary | ICD-10-CM | POA: Diagnosis not present

## 2021-11-21 DIAGNOSIS — R059 Cough, unspecified: Secondary | ICD-10-CM | POA: Diagnosis present

## 2021-11-21 DIAGNOSIS — D649 Anemia, unspecified: Secondary | ICD-10-CM | POA: Insufficient documentation

## 2021-11-21 DIAGNOSIS — J069 Acute upper respiratory infection, unspecified: Secondary | ICD-10-CM | POA: Insufficient documentation

## 2021-11-21 DIAGNOSIS — D259 Leiomyoma of uterus, unspecified: Secondary | ICD-10-CM | POA: Diagnosis not present

## 2021-11-21 MED ORDER — OSELTAMIVIR PHOSPHATE 75 MG PO CAPS: 75.0000 mg | ORAL_CAPSULE | Freq: Two times a day (BID) | ORAL | 0 refills | Status: AC

## 2021-11-21 NOTE — Patient Instructions (Addendum)
It was wonderful to see you today.  Please bring ALL of your medications with you to every visit.   Today we talked about:  Today we swabbed you for flu and COVID You can start taking Tamiflu 75 mg twice daily and I will call you tomorrow with your swab results, if you have influenza we will continue this if you do not we will discontinue it If you start having shortness of breath, inability to catch her breath, chest pain, inability to take anything by mouth, or worsening breathing not responding to albuterol inhaler please go to the ED   Thank you for choosing [facility].   Please call [phone] with any questions about today's appointment.  Please be sure to schedule follow up at the front  desk before you leave today.   Please arrive at least 15 minutes prior to your scheduled appointments.   If you had blood work today, I will send you a MyChart message or a letter if results are normal. Otherwise, I will give you a call.   If you had a referral placed, they will call you to set up an appointment. Please give [name] a call if you don't hear back in the next 2 weeks.   If you need additional refills before your next appointment, please call your pharmacy first.   Yehuda Savannah, MD  Family Medicine

## 2021-11-21 NOTE — Assessment & Plan Note (Signed)
-   influenza vs COVID suspected clinically, COVID tested today, flu tested today, recommended to treat clinically and not delay start of treatment for test results - patient  is at higher risk for complications due to: comorbidities (lung dx, stroke, CV dx, renal disease, hepatic dx, DM, hematologic dx - asthma - Will prescribe Tamiflu 75 mg BID x 5 days, sent to pharmacy, counseled on side effects include GI upset - will call patient tomorrow to discuss continuing if flu positive versus COVID treatment if positive
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
## 2021-11-22 ENCOUNTER — Emergency Department (HOSPITAL_BASED_OUTPATIENT_CLINIC_OR_DEPARTMENT_OTHER)
Admission: EM | Admit: 2021-11-22 | Discharge: 2021-11-22 | Disposition: A | Discharge status: Home / Self Care | Payer: Medicaid Other | Attending: Emergency Medicine | Admitting: Emergency Medicine

## 2021-11-22 ENCOUNTER — Telehealth: Payer: Self-pay | Admitting: Family Medicine

## 2021-11-22 ENCOUNTER — Other Ambulatory Visit: Payer: Self-pay

## 2021-11-22 ENCOUNTER — Encounter (HOSPITAL_BASED_OUTPATIENT_CLINIC_OR_DEPARTMENT_OTHER): Payer: Self-pay

## 2021-11-22 DIAGNOSIS — E871 Hypo-osmolality and hyponatremia: Secondary | ICD-10-CM

## 2021-11-22 DIAGNOSIS — R112 Nausea with vomiting, unspecified: Secondary | ICD-10-CM

## 2021-11-22 DIAGNOSIS — D509 Iron deficiency anemia, unspecified: Secondary | ICD-10-CM

## 2021-11-22 DIAGNOSIS — B349 Viral infection, unspecified: Secondary | ICD-10-CM

## 2021-11-22 LAB — RESP PANEL BY RT-PCR (FLU A&B, COVID) ARPGX2
Influenza A by PCR: NEGATIVE
Influenza B by PCR: NEGATIVE
SARS Coronavirus 2 by RT PCR: NEGATIVE

## 2021-11-22 LAB — CBC
HCT: 36.1 % (ref 36.0–46.0)
Hemoglobin: 11.3 g/dL — ABNORMAL LOW (ref 12.0–15.0)
MCH: 22.4 pg — ABNORMAL LOW (ref 26.0–34.0)
MCHC: 31.3 g/dL (ref 30.0–36.0)
MCV: 71.6 fL — ABNORMAL LOW (ref 80.0–100.0)
Platelets: 245 10*3/uL (ref 150–400)
RBC: 5.04 MIL/uL (ref 3.87–5.11)
RDW: 16.4 % — ABNORMAL HIGH (ref 11.5–15.5)
WBC: 13 10*3/uL — ABNORMAL HIGH (ref 4.0–10.5)
nRBC: 0 % (ref 0.0–0.2)

## 2021-11-22 LAB — COMPREHENSIVE METABOLIC PANEL
ALT: 11 U/L (ref 0–44)
AST: 11 U/L — ABNORMAL LOW (ref 15–41)
Albumin: 4.3 g/dL (ref 3.5–5.0)
Alkaline Phosphatase: 43 U/L (ref 38–126)
Anion gap: 12 (ref 5–15)
BUN: 8 mg/dL (ref 6–20)
CO2: 22 mmol/L (ref 22–32)
Calcium: 9.5 mg/dL (ref 8.9–10.3)
Chloride: 98 mmol/L (ref 98–111)
Creatinine, Ser: 0.86 mg/dL (ref 0.44–1.00)
GFR, Estimated: >60 mL/min (ref >60)
Glucose, Bld: 132 mg/dL — ABNORMAL HIGH (ref 70–99)
Potassium: 3.6 mmol/L (ref 3.5–5.1)
Sodium: 132 mmol/L — ABNORMAL LOW (ref 135–145)
Total Bilirubin: 0.5 mg/dL (ref 0.3–1.2)
Total Protein: 8.7 g/dL — ABNORMAL HIGH (ref 6.5–8.1)

## 2021-11-22 LAB — URINALYSIS, ROUTINE W REFLEX MICROSCOPIC
Bilirubin Urine: NEGATIVE
Glucose, UA: NEGATIVE mg/dL
Ketones, ur: 40 mg/dL — AB
Leukocytes,Ua: NEGATIVE
Nitrite: NEGATIVE
Protein, ur: >300 mg/dL — AB
Specific Gravity, Urine: 1.045 — ABNORMAL HIGH (ref 1.005–1.030)
pH: 6.5 (ref 5.0–8.0)

## 2021-11-22 LAB — GROUP A STREP BY PCR: Group A Strep by PCR: NOT DETECTED

## 2021-11-22 LAB — LIPASE, BLOOD: Lipase: 16 U/L (ref 11–51)

## 2021-11-22 LAB — PREGNANCY, URINE: Preg Test, Ur: NEGATIVE

## 2021-11-22 MED ORDER — LACTATED RINGERS IV BOLUS
1000.0000 mL | Freq: Once | INTRAVENOUS | Status: AC
  Administered 2021-11-22: 1000 mL via INTRAVENOUS

## 2021-11-22 MED ORDER — ONDANSETRON 8 MG PO TBDP: 8.0000 mg | ORAL_TABLET | Freq: Three times a day (TID) | ORAL | 0 refills | Status: DC | PRN

## 2021-11-22 MED ORDER — ONDANSETRON HCL 4 MG/2ML IJ SOLN
4.0000 mg | Freq: Once | INTRAMUSCULAR | Status: AC
  Administered 2021-11-22: 4 mg via INTRAVENOUS
  Filled 2021-11-22: qty 2

## 2021-11-22 MED ORDER — KETOROLAC TROMETHAMINE 30 MG/ML IJ SOLN
30.0000 mg | Freq: Once | INTRAMUSCULAR | Status: AC
  Administered 2021-11-22: 30 mg via INTRAVENOUS
  Filled 2021-11-22: qty 1

## 2021-11-22 MED ORDER — LOPERAMIDE HCL 2 MG PO CAPS
4.0000 mg | ORAL_CAPSULE | Freq: Once | ORAL | Status: AC
Start: 2021-11-22 — End: 2021-11-22
  Administered 2021-11-22: 4 mg via ORAL
  Filled 2021-11-22: qty 2

## 2021-11-22 NOTE — Telephone Encounter (Signed)
Called patient, started having vomiting after second dose of Tamiflu. Discussed ED visit results, negative for flu/COVID and discussed that she can stop taking Tamiflu. She notes feeling improved since ED visit. She has been able to pick up the Zofran prescribed.   Discussed ED return precautions including chest pain, shortness of breath, inability to tolerate oral due to vomiting, high fevers not improved with tylenol.  Patient voiced understanding and all questions answered.  Yehuda Savannah MD

## 2021-11-22 NOTE — ED Provider Notes (Signed)
[facility] EMERGENCY DEPT Provider Note   CSN: [id number] Arrival date & time: 11/21/21  2359     History  Chief Complaint  Patient presents with   Emesis    Lynn Wilcox is a 41 y.o. female.  The history is provided by the patient.  Emesis She has history of asthma and comes in complaining of vomiting and diarrhea.  She started getting sick 2 days ago with sore throat and a cough which is productive of small amount of clear sputum.  She saw her primary care provider earlier today who recommended TheraFlu.  Patient started vomiting after her second dose of TheraFlu and has not been able to hold anything down since then.  She has also had 2 episodes of diarrhea.  With her illness, she has had subjective fever along with chills and sweats and generalized body aches.  She has had no known sick contacts.   Home Medications Prior to Admission medications   Medication Sig Start Date End Date Taking? Authorizing Provider  cetirizine (ZYRTEC) 10 MG tablet TAKE 1 TABLET BY MOUTH DAILY 07/07/20   Lurline Del, DO  famotidine (PEPCID) 40 MG tablet Take 1 tablet (40 mg total) by mouth daily. 07/19/20   Thornton Park, MD  ibuprofen (ADVIL,MOTRIN) 600 MG tablet Take 1 tablet (600 mg total) by mouth every 6 (six) hours as needed for mild pain, moderate pain or cramping. 08/24/13   Roma Schanz, CNM  oseltamivir (TAMIFLU) 75 MG capsule Take 1 capsule (75 mg total) by mouth 2 (two) times daily for 5 days. 11/21/21 11/26/21  Lenoria Chime, MD  pantoprazole (PROTONIX) 40 MG tablet Take 1 tablet (40 mg total) by mouth daily. 07/19/20   Thornton Park, MD  PROAIR HFA 108 [phone] Base) MCG/ACT inhaler INHALE 2 PUFFS INTO THE LUNGS EVERY 6 HOURS AS NEEDED FOR WHEEZING 09/06/20   Lurline Del, DO      Allergies    Chocolate, Coffee bean extract [coffea arabica], Soy allergy, Tea, and Yeast-related products    Review of Systems   Review of Systems  Gastrointestinal:  Positive  for vomiting.  All other systems reviewed and are negative.  Physical Exam Updated Vital Signs BP 128/84    Pulse 95    Temp 99.4 F (37.4 C) (Oral)    Resp 15    Ht 5\' 3"  (1.6 m)    Wt 99.8 kg    LMP 11/14/2021 (Exact Date)    SpO2 100%    BMI 38.97 kg/m  Physical Exam Vitals and nursing note reviewed.  41 year old female, resting comfortably and in no acute distress. Vital signs are normal. Oxygen saturation is 100%, which is normal. Head is normocephalic and atraumatic. PERRLA, EOMI. Oropharynx is faintly erythematous without tonsillar hypertrophy or exudate. Neck is nontender and supple without adenopathy or JVD. Back is nontender and there is no CVA tenderness. Lungs are clear without rales, wheezes, or rhonchi. Chest is nontender. Heart has regular rate and rhythm without murmur. Abdomen is soft, flat, with mild tenderness diffusely.  There is no rebound or guarding.  Peristalsis is hypoactive. Extremities have no cyanosis or edema, full range of motion is present. Skin is warm and dry without rash. Neurologic: Mental status is normal, cranial nerves are intact, t moves all extremities equally.  ED Results / Procedures / Treatments   Labs (all labs ordered are listed, but only abnormal results are displayed) Labs Reviewed  COMPREHENSIVE METABOLIC PANEL - Abnormal; Notable for the following components:  Result Value   Sodium 132 (*)    Glucose, Bld 132 (*)    Total Protein 8.7 (*)    AST 11 (*)    All other components within normal limits  CBC - Abnormal; Notable for the following components:   WBC 13.0 (*)    Hemoglobin 11.3 (*)    MCV 71.6 (*)    MCH 22.4 (*)    RDW 16.4 (*)    All other components within normal limits  URINALYSIS, ROUTINE W REFLEX MICROSCOPIC - Abnormal; Notable for the following components:   Specific Gravity, Urine 1.045 (*)    Hgb urine dipstick MODERATE (*)    Ketones, ur 40 (*)    Protein, ur >300 (*)    Bacteria, UA FEW (*)    All other  components within normal limits  RESP PANEL BY RT-PCR (FLU A&B, COVID) ARPGX2  GROUP A STREP BY PCR  LIPASE, BLOOD  PREGNANCY, URINE   Procedures Procedures    Medications Ordered in ED Medications  lactated ringers bolus 1,000 mL (has no administration in time range)  ondansetron (ZOFRAN) injection 4 mg (has no administration in time range)  ketorolac (TORADOL) 30 MG/ML injection 30 mg (has no administration in time range)  loperamide (IMODIUM) capsule 4 mg (has no administration in time range)    ED Course/ Medical Decision Making/ A&P                           Medical Decision Making Amount and/or Complexity of Data Reviewed Labs: ordered.  Risk Prescription drug management.   Viral illness with cough, sore throat, vomiting, diarrhea.  Consider influenza, COVID-19, other viral infections.  We will check strep PCR to rule out streptococcal pharyngitis and obtained respiratory pathogen panel to rule out influenza and COVID-19.  She is given IV fluids, ondansetron for nausea, ketorolac for body aches, and oral loperamide for diarrhea.  Screening labs are obtained.  Respiratory pathogen panel was negative for influenza and COVID-19.  Labs show mild hyponatremia which is not felt to be clinically significant, mild anemia which is in a range she has been at in the past.  Urinalysis is significant for ketonuria, consistent with recent emesis, and elevated specific gravity consistent with dehydration.  Following IV hydration and above-noted medications, patient is feeling much better.  She is discharged with prescription for ondansetron oral dissolving tablets, told to use over-the-counter NSAIDs and acetaminophen as needed for body aches, over-the-counter loperamide as needed for diarrhea.  Return precautions discussed.        Final Clinical Impression(s) / ED Diagnoses Final diagnoses:  Viral illness  Nausea vomiting and diarrhea  Hyponatremia  Microcytic anemia    Rx /  DC Orders ED Discharge Orders          Ordered    ondansetron (ZOFRAN-ODT) 8 MG disintegrating tablet  Every 8 hours PRN        11/22/21 9233              Delora Fuel, MD 00/76/22 [phone]

## 2021-11-22 NOTE — Discharge Instructions (Addendum)
Take loperamide (Imodium A-D) as needed for diarrhea.  Take acetaminophen and/or ibuprofen as needed for aching.  Return if symptoms are getting worse.

## 2021-11-22 NOTE — ED Triage Notes (Signed)
Vomiting started around 5 am yesterday morning and has body aches and flu like symptoms since this past Sunday.

## 2021-11-23 LAB — COVID-19, FLU A+B NAA
Influenza A, NAA: NOT DETECTED
Influenza B, NAA: NOT DETECTED
SARS-CoV-2, NAA: NOT DETECTED

## 2021-12-01 ENCOUNTER — Ambulatory Visit (HOSPITAL_COMMUNITY): Payer: Medicaid Other | Attending: Family Medicine

## 2021-12-01 NOTE — Addendum Note (Signed)
Addended by: Londell Moh T on: 12/01/2021 10:54 AM   Modules accepted: Orders

## 2022-02-19 ENCOUNTER — Other Ambulatory Visit: Payer: Self-pay

## 2022-02-19 ENCOUNTER — Encounter (HOSPITAL_BASED_OUTPATIENT_CLINIC_OR_DEPARTMENT_OTHER): Payer: Self-pay | Admitting: Obstetrics and Gynecology

## 2022-02-19 ENCOUNTER — Emergency Department (HOSPITAL_BASED_OUTPATIENT_CLINIC_OR_DEPARTMENT_OTHER)
Admission: EM | Admit: 2022-02-19 | Discharge: 2022-02-19 | Disposition: A | Discharge status: Home / Self Care | Payer: Medicaid Other | Attending: Emergency Medicine | Admitting: Emergency Medicine

## 2022-02-19 DIAGNOSIS — R112 Nausea with vomiting, unspecified: Secondary | ICD-10-CM | POA: Diagnosis present

## 2022-02-19 DIAGNOSIS — R6883 Chills (without fever): Secondary | ICD-10-CM | POA: Insufficient documentation

## 2022-02-19 DIAGNOSIS — R197 Diarrhea, unspecified: Secondary | ICD-10-CM | POA: Insufficient documentation

## 2022-02-19 DIAGNOSIS — R1084 Generalized abdominal pain: Secondary | ICD-10-CM | POA: Insufficient documentation

## 2022-02-19 LAB — URINALYSIS, ROUTINE W REFLEX MICROSCOPIC
Bilirubin Urine: NEGATIVE
Glucose, UA: 100 mg/dL — AB
Hgb urine dipstick: NEGATIVE
Ketones, ur: 15 mg/dL — AB
Leukocytes,Ua: NEGATIVE
Nitrite: NEGATIVE
Protein, ur: NEGATIVE mg/dL
Specific Gravity, Urine: 1.015 (ref 1.005–1.030)
pH: 8 (ref 5.0–8.0)

## 2022-02-19 LAB — COMPREHENSIVE METABOLIC PANEL
ALT: 17 U/L (ref 0–44)
AST: 15 U/L (ref 15–41)
Albumin: 4.6 g/dL (ref 3.5–5.0)
Alkaline Phosphatase: 55 U/L (ref 38–126)
Anion gap: 12 (ref 5–15)
BUN: 10 mg/dL (ref 6–20)
CO2: 24 mmol/L (ref 22–32)
Calcium: 9.9 mg/dL (ref 8.9–10.3)
Chloride: 102 mmol/L (ref 98–111)
Creatinine, Ser: 0.88 mg/dL (ref 0.44–1.00)
GFR, Estimated: >60 mL/min (ref >60)
Glucose, Bld: 140 mg/dL — ABNORMAL HIGH (ref 70–99)
Potassium: 3.7 mmol/L (ref 3.5–5.1)
Sodium: 138 mmol/L (ref 135–145)
Total Bilirubin: 0.3 mg/dL (ref 0.3–1.2)
Total Protein: 8.8 g/dL — ABNORMAL HIGH (ref 6.5–8.1)

## 2022-02-19 LAB — CBC
HCT: 36.3 % (ref 36.0–46.0)
Hemoglobin: 10.9 g/dL — ABNORMAL LOW (ref 12.0–15.0)
MCH: 21.8 pg — ABNORMAL LOW (ref 26.0–34.0)
MCHC: 30 g/dL (ref 30.0–36.0)
MCV: 72.6 fL — ABNORMAL LOW (ref 80.0–100.0)
Platelets: 311 10*3/uL (ref 150–400)
RBC: 5 MIL/uL (ref 3.87–5.11)
RDW: 16.2 % — ABNORMAL HIGH (ref 11.5–15.5)
WBC: 11.2 10*3/uL — ABNORMAL HIGH (ref 4.0–10.5)
nRBC: 0 % (ref 0.0–0.2)

## 2022-02-19 LAB — LIPASE, BLOOD: Lipase: 24 U/L (ref 11–51)

## 2022-02-19 LAB — PREGNANCY, URINE: Preg Test, Ur: NEGATIVE

## 2022-02-19 MED ORDER — ONDANSETRON HCL 4 MG/2ML IJ SOLN
4.0000 mg | Freq: Once | INTRAMUSCULAR | Status: DC | PRN
  Filled 2022-02-19: qty 2

## 2022-02-19 MED ORDER — PANTOPRAZOLE SODIUM 40 MG IV SOLR
40.0000 mg | Freq: Once | INTRAVENOUS | Status: AC
Start: 2022-02-19 — End: 2022-02-19
  Administered 2022-02-19: 40 mg via INTRAVENOUS

## 2022-02-19 MED ORDER — SODIUM CHLORIDE 0.9 % IV BOLUS
1000.0000 mL | Freq: Once | INTRAVENOUS | Status: AC
  Administered 2022-02-19: 1000 mL via INTRAVENOUS

## 2022-02-19 MED ORDER — PROCHLORPERAZINE EDISYLATE 10 MG/2ML IJ SOLN
5.0000 mg | Freq: Once | INTRAMUSCULAR | Status: AC
  Administered 2022-02-19: 5 mg via INTRAVENOUS
  Filled 2022-02-19: qty 2

## 2022-02-19 MED ORDER — FENTANYL CITRATE PF 50 MCG/ML IJ SOSY
50.0000 ug | PREFILLED_SYRINGE | Freq: Once | INTRAMUSCULAR | Status: AC
  Administered 2022-02-19: 50 ug via INTRAVENOUS
  Filled 2022-02-19: qty 1

## 2022-02-19 MED ORDER — ONDANSETRON HCL 4 MG/2ML IJ SOLN
4.0000 mg | Freq: Once | INTRAMUSCULAR | Status: AC
  Administered 2022-02-19: 4 mg via INTRAVENOUS
  Filled 2022-02-19: qty 2

## 2022-02-19 MED ORDER — ONDANSETRON HCL 4 MG/2ML IJ SOLN
4.0000 mg | Freq: Once | INTRAMUSCULAR | Status: AC
  Administered 2022-02-19: 4 mg via INTRAVENOUS

## 2022-02-19 NOTE — ED Notes (Signed)
Pt has refused multiple times to give urine sample,stating it hurts too bad to stand ?

## 2022-02-19 NOTE — ED Notes (Signed)
Pt continuing to dry heave/wretch/vomit. Cleaned of urine,unable to obtain sample as was incontinent. Pure wick in place. ?

## 2022-02-19 NOTE — ED Notes (Signed)
Dc instructions reviewed with patient. Patient voiced understanding. Dc with belongings.  °

## 2022-02-19 NOTE — Discharge Instructions (Addendum)
Follow-up with your primary care doctor or gastroenterologist if your symptoms continue.  Return to the emergency room if you have any worsening symptoms. ? ?Follow-up with your primary care doctor to have your blood pressure rechecked as its been elevated here in the emergency department. ?

## 2022-02-19 NOTE — ED Triage Notes (Signed)
Patient reports to the ER for nausea, reports she ate [employer] and has been throwing up all night and has not been able to keep anything down. Patient endorses chills.  ?

## 2022-02-19 NOTE — ED Notes (Signed)
Pt complained her iv doesn't feel good in her arm and she cant get comfortable to sleep. Iv has great blood return, flushes well. Iv dc per pt request. ?

## 2022-02-19 NOTE — ED Notes (Signed)
Pt awaiting her husband to bring clothes for discharge. ?

## 2022-02-19 NOTE — ED Provider Notes (Addendum)
?[facility] ?Provider Note ? ? ?CSN: [id number] ?Arrival date & time: 02/19/22  0909 ? ?  ? ?History ? ?Chief Complaint  ?Patient presents with  ? Nausea  ? ? ?Lynn Wilcox is a 41 y.o. female. ? ?Patient is a 41 year old female who presents with nausea vomiting and diarrhea.  She had a [employer] last night and started having the symptoms early this morning.  No fevers although she is having chills.  She has some intermittent diffuse abdominal pain.  No bloody diarrhea.  Her emesis is nonbloody and nonbilious.  She does have food sensitivities and has had similar symptoms in the past after she eats processed foods.  She says normally she has to get IV fluids and symptomatic treatment in the ED.  She is also followed by gastroenterology. ? ? ?  ? ?Home Medications ?Prior to Admission medications   ?Medication Sig Start Date End Date Taking? Authorizing Provider  ?cetirizine (ZYRTEC) 10 MG tablet TAKE 1 TABLET BY MOUTH DAILY 07/07/20   Lurline Del, DO  ?famotidine (PEPCID) 40 MG tablet Take 1 tablet (40 mg total) by mouth daily. 07/19/20   Thornton Park, MD  ?ibuprofen (ADVIL,MOTRIN) 600 MG tablet Take 1 tablet (600 mg total) by mouth every 6 (six) hours as needed for mild pain, moderate pain or cramping. 08/24/13   Roma Schanz, CNM  ?ondansetron (ZOFRAN-ODT) 8 MG disintegrating tablet Take 1 tablet (8 mg total) by mouth every 8 (eight) hours as needed for nausea or vomiting. 03/27/49   Delora Fuel, MD  ?pantoprazole (PROTONIX) 40 MG tablet Take 1 tablet (40 mg total) by mouth daily. 07/19/20   Thornton Park, MD  ?PROAIR HFA 108 [phone] Base) MCG/ACT inhaler INHALE 2 PUFFS INTO THE LUNGS EVERY 6 HOURS AS NEEDED FOR WHEEZING 09/06/20   Lurline Del, DO  ?   ? ?Allergies    ?Chocolate, Coffee bean extract [coffea arabica], Soy allergy, Tea, and Yeast-related products   ? ?Review of Systems   ?Review of Systems  ?Constitutional:  Negative for chills, diaphoresis, fatigue and  fever.  ?HENT:  Negative for congestion, rhinorrhea and sneezing.   ?Eyes: Negative.   ?Respiratory:  Negative for cough, chest tightness and shortness of breath.   ?Cardiovascular:  Negative for chest pain and leg swelling.  ?Gastrointestinal:  Positive for abdominal pain, diarrhea, nausea and vomiting. Negative for blood in stool.  ?Genitourinary:  Negative for difficulty urinating, flank pain, frequency and hematuria.  ?Musculoskeletal:  Negative for arthralgias and back pain.  ?Skin:  Negative for rash.  ?Neurological:  Negative for dizziness, speech difficulty, weakness, numbness and headaches.  ? ?Physical Exam ?Updated Vital Signs ?BP (!) 154/97   Pulse 80   Temp 97.6 ?F (36.4 ?C) (Oral)   Resp 16   Ht '5\' 3"'$  (1.6 m)   Wt 99.8 kg   LMP 01/16/2022 (Approximate)   SpO2 100%   BMI 38.97 kg/m?  ?Physical Exam ?Constitutional:   ?   Appearance: She is well-developed.  ?HENT:  ?   Head: Normocephalic and atraumatic.  ?Eyes:  ?   Pupils: Pupils are equal, round, and reactive to light.  ?Cardiovascular:  ?   Rate and Rhythm: Normal rate and regular rhythm.  ?   Heart sounds: Normal heart sounds.  ?Pulmonary:  ?   Effort: Pulmonary effort is normal. No respiratory distress.  ?   Breath sounds: Normal breath sounds. No wheezing or rales.  ?Chest:  ?   Chest wall: No tenderness.  ?  Abdominal:  ?   General: Bowel sounds are normal.  ?   Palpations: Abdomen is soft.  ?   Tenderness: There is abdominal tenderness (Mild diffuse tenderness). There is no guarding or rebound.  ?Musculoskeletal:     ?   General: Normal range of motion.  ?   Cervical back: Normal range of motion and neck supple.  ?Lymphadenopathy:  ?   Cervical: No cervical adenopathy.  ?Skin: ?   General: Skin is warm and dry.  ?   Findings: No rash.  ?Neurological:  ?   Mental Status: She is alert and oriented to person, place, and time.  ? ? ?ED Results / Procedures / Treatments   ?Labs ?(all labs ordered are listed, but only abnormal results are  displayed) ?Labs Reviewed  ?COMPREHENSIVE METABOLIC PANEL - Abnormal; Notable for the following components:  ?    Result Value  ? Glucose, Bld 140 (*)   ? Total Protein 8.8 (*)   ? All other components within normal limits  ?CBC - Abnormal; Notable for the following components:  ? WBC 11.2 (*)   ? Hemoglobin 10.9 (*)   ? MCV 72.6 (*)   ? MCH 21.8 (*)   ? RDW 16.2 (*)   ? All other components within normal limits  ?URINALYSIS, ROUTINE W REFLEX MICROSCOPIC - Abnormal; Notable for the following components:  ? Color, Urine COLORLESS (*)   ? Glucose, UA 100 (*)   ? Ketones, ur 15 (*)   ? All other components within normal limits  ?LIPASE, BLOOD  ?PREGNANCY, URINE  ? ? ?EKG ?None ? ?Radiology ?No results found. ? ?Procedures ?Procedures  ? ? ?Medications Ordered in ED ?Medications  ?ondansetron (ZOFRAN) injection 4 mg (has no administration in time range)  ?sodium chloride 0.9 % bolus 1,000 mL (1,000 mLs Intravenous New Bag/Given 02/19/22 0945)  ?ondansetron [facility]) injection 4 mg (4 mg Intravenous Given 02/19/22 0945)  ?pantoprazole (PROTONIX) injection 40 mg (40 mg Intravenous Given 02/19/22 1024)  ?fentaNYL (SUBLIMAZE) injection 50 mcg (50 mcg Intravenous Given 02/19/22 1024)  ?ondansetron (ZOFRAN) injection 4 mg (4 mg Intravenous Given 02/19/22 1151)  ?sodium chloride 0.9 % bolus 1,000 mL (1,000 mLs Intravenous New Bag/Given 02/19/22 1150)  ?prochlorperazine (COMPAZINE) injection 5 mg (5 mg Intravenous Given 02/19/22 1259)  ? ? ?ED Course/ Medical Decision Making/ A&P ?  ?                        ?Medical Decision Making ?Amount and/or Complexity of Data Reviewed ?Independent Historian: spouse ?External Data Reviewed: labs and notes. ?Labs: ordered. Decision-making details documented in ED Course. ? ?Risk ?Parenteral controlled substances. ?Decision regarding hospitalization. ? ? ?Patient is a 41 year old who presents with nausea vomiting and diarrhea.  She has had similar symptoms in the past when she eats processed foods.   She also had some generalized abdominal tenderness.  She denies any atypical symptoms.  No fevers.  Her labs were checked and are nonconcerning.  She was treated with antiemetics and IV fluids as well as some pain medications.  She is feeling better after this.  Her abdominal exam is benign.  I do not feel that she needs further imaging at this point.  She has really improved.  She no longer has any nausea.  She was able to drink some water.  At this point I do not feel that she needs hospitalization.  She was discharged home in good condition.  Return precautions were  given.  She was advised to follow-up with her primary care doctor or gastroenterologist if her symptoms continue.  She was also advised that she should follow-up with her doctor to have her blood pressure rechecked. ? ?Final Clinical Impression(s) / ED Diagnoses ?Final diagnoses:  ?Nausea and vomiting, unspecified vomiting type  ? ? ?Rx / DC Orders ?ED Discharge Orders   ? ? None  ? ?  ? ? ?  ?Malvin Johns, MD ?02/19/22 1511 ? ?  ?Malvin Johns, MD ?02/19/22 1512 ? ?

## 2022-02-19 NOTE — ED Notes (Signed)
Pt wants to try and sleep, was up all night working, wants her bp cuff and o2 sat off. ?

## 2022-02-21 ENCOUNTER — Ambulatory Visit (HOSPITAL_COMMUNITY): Payer: Medicaid Other

## 2022-02-21 ENCOUNTER — Other Ambulatory Visit (HOSPITAL_COMMUNITY): Payer: Medicaid Other

## 2022-05-08 ENCOUNTER — Ambulatory Visit (INDEPENDENT_AMBULATORY_CARE_PROVIDER_SITE_OTHER): Payer: Medicaid Other | Admitting: Student

## 2022-05-08 VITALS — BP 126/73 | HR 64 | Wt 232.4 lb

## 2022-05-08 DIAGNOSIS — D649 Anemia, unspecified: Secondary | ICD-10-CM | POA: Insufficient documentation

## 2022-05-08 DIAGNOSIS — B0239 Other herpes zoster eye disease: Secondary | ICD-10-CM | POA: Diagnosis not present

## 2022-05-08 NOTE — Progress Notes (Signed)
  SUBJECTIVE:   CHIEF COMPLAINT / HPI:   Shingles Patient presented to the emergency room at [facility] for painful rash to the right side of her face that started around 04/27/2022.  It appeared to be in the V1 distribution at that time.  She did not have eye involvement at that time as well.  They started her on Valtrex and instructed her to see ophthalmology after leaving the emergency department.  She reports that she saw the eye doctor, was given some eyedrops and was instructed to return in 1 week.  She has an appointment with the eye doctor after our visit today.  She is requesting a return to work note.  Patient reports that she still has pressure, painful still but greatly improved with the Valtrex, is almost down with her Valtrex course HA with the pain and haziness in the right eye   PERTINENT  PMH / PSH:   Past Medical History:  Diagnosis Date   Anxiety and depression 1995   Arthritis 2014   Asthma 1993   Uses inhaler prn   Diverticulosis    Esophagitis    H/O gastroesophageal reflux (GERD) 2013   Hiatal hernia    Hypokalemia    Migraines    Obesity    Seasonal allergies     OBJECTIVE:  BP 126/73   Pulse 64   Wt 232 lb 6.4 oz (105.4 kg)   LMP 04/25/2022 (Exact Date)   SpO2 100%   BMI 41.17 kg/m   General: NAD, pleasant, able to participate in exam: African-American female HEENT: Small scabbed, healing lesions ~3-4 over eye, mild conjunctival injection on the right side, PERRLA  Cardiac: RRR, no murmurs auscultated Respiratory: normal WOB Psych: Normal affect and mood  ASSESSMENT/PLAN:  Anemia Most likely iron deficiency anemia given patient's age and menstrual history We will continue with ferritin and CBC to monitor hemoglobin Possibly start iron if indicated.  Other herpes zoster eye disease Patient with appropriate treatment of shingles, completing Valtrex Continue with appointment with ophthalmology after this visit today Patient does not appear  to have active open lesions and is not contagious Patient is able to return to work, note provided If pain persists or worsens, we could consider gabapentin, patient to call clinic if this occurs.   Orders Placed This Encounter  Procedures   CBC   Ferritin   No orders of the defined types were placed in this encounter.  Return if symptoms worsen or fail to improve. [name], MD 05/08/2022, 12:09 PM PGY-2, [place]

## 2022-05-08 NOTE — Patient Instructions (Addendum)
It was great to see you today! Thank you for choosing [facility] for your primary care. Lynn Wilcox was seen for follow up for shingles  Today we addressed:  Continue with Valtrex until completed  Please see your eye doctor today as well  I will provide a work note  If the pain worsens or persists, please call  I will call with lab results  If you haven't already, sign up for My Chart to have easy access to your labs results, and communication with your primary care physician.  You should return to our clinic Return if symptoms worsen or fail to improve.  I recommend that you always bring your medications to each appointment as this makes it easy to ensure you are on the correct medications and helps [name] not miss refills when you need them.  Please arrive 15 minutes before your appointment to ensure smooth check in process.  We appreciate your efforts in making this happen.  Please call the clinic at [phone] if your symptoms worsen or you have any concerns.  Thank you for allowing me to participate in your care, [name], MD 05/08/2022, 11:58 AM PGY-2, [place]

## 2022-05-08 NOTE — Assessment & Plan Note (Signed)
Most likely iron deficiency anemia given patient's age and menstrual history We will continue with ferritin and CBC to monitor hemoglobin Possibly start iron if indicated.

## 2022-05-08 NOTE — Assessment & Plan Note (Addendum)
Patient with appropriate treatment of shingles, completing Valtrex Continue with appointment with ophthalmology after this visit today Patient does not appear to have active open lesions and is not contagious Patient is able to return to work, note provided If pain persists or worsens, we could consider gabapentin, patient to call clinic if this occurs.

## 2022-05-09 ENCOUNTER — Telehealth: Payer: Self-pay

## 2022-05-09 ENCOUNTER — Other Ambulatory Visit: Payer: Self-pay | Admitting: Student

## 2022-05-09 DIAGNOSIS — D649 Anemia, unspecified: Secondary | ICD-10-CM

## 2022-05-09 LAB — CBC
Hematocrit: 33.2 % — ABNORMAL LOW (ref 34.0–46.6)
Hemoglobin: 9.8 g/dL — ABNORMAL LOW (ref 11.1–15.9)
MCH: 21.7 pg — ABNORMAL LOW (ref 26.6–33.0)
MCHC: 29.5 g/dL — ABNORMAL LOW (ref 31.5–35.7)
MCV: 74 fL — ABNORMAL LOW (ref 79–97)
Platelets: 279 10*3/uL (ref 150–450)
RBC: 4.51 x10E6/uL (ref 3.77–5.28)
RDW: 16.7 % — ABNORMAL HIGH (ref 11.7–15.4)
WBC: 5.8 10*3/uL (ref 3.4–10.8)

## 2022-05-09 LAB — FERRITIN: Ferritin: 5 ng/mL — ABNORMAL LOW (ref 15–150)

## 2022-05-09 MED ORDER — FERROUS SULFATE 324 (65 FE) MG PO TBEC: 1.0000 | DELAYED_RELEASE_TABLET | ORAL | 2 refills | Status: DC

## 2022-05-09 NOTE — Telephone Encounter (Signed)
Patient returns call to nurse line regarding results. Advised of note per Dr. Zigmund Daniel.   Patient is also requesting referral to OBGYN for evaluation of uterine fibroids.   Patient also wanted to make provider aware that she went to eye doctor yesterday. They started her on Durezol eye drops and she is to follow up in 2-3 weeks.   Contacted [facility] and requested medical records from this visit.   They are faxing to our office.   Talbot Grumbling, RN

## 2022-05-09 NOTE — Progress Notes (Signed)
Hgb 9.8  MCV 74 Ferritin 5 IDA of unknown origin but likely due to heavy menstrual cycles  different causes of iron deficiency including blood loss and malabsorption. May need GI scope if menstrual cycle does not prove to be the cause    Plan: Oral iron every other day with re-assessment in 1 month Iron to pharmacy  Will call with results

## 2022-05-11 ENCOUNTER — Telehealth: Payer: Self-pay

## 2022-05-11 NOTE — Telephone Encounter (Signed)
Appt made for 05/17/22 at [facility] 1:30p. Salvatore Marvel, CMA

## 2022-05-11 NOTE — Telephone Encounter (Signed)
-----   Message from [name], MD sent at 05/09/2022 12:17 PM EDT ----- Patient needs pelvic ultrasound scheduled. She missed her last scheduled ultrasound. Order was already placed. Do we call and schedule another pelvic ultrasound?

## 2022-05-11 NOTE — Telephone Encounter (Signed)
Attempted to reach patient. No answer. LVM of the appt time and date. Salvatore Marvel, CMA

## 2022-05-17 ENCOUNTER — Ambulatory Visit (HOSPITAL_COMMUNITY)
Admission: RE | Admit: 2022-05-17 | Discharge: 2022-05-17 | Disposition: A | Discharge status: Home / Self Care | Payer: Medicaid Other | Source: Ambulatory Visit | Attending: Family Medicine | Admitting: Family Medicine

## 2022-05-17 DIAGNOSIS — D259 Leiomyoma of uterus, unspecified: Secondary | ICD-10-CM | POA: Insufficient documentation

## 2022-05-19 ENCOUNTER — Telehealth: Payer: Self-pay | Admitting: Family Medicine

## 2022-05-19 DIAGNOSIS — D250 Submucous leiomyoma of uterus: Secondary | ICD-10-CM

## 2022-05-19 NOTE — Telephone Encounter (Signed)
Called patient and discussed TVUS showing uterine fibroids including submucosal fibroid. She continues to have heavy menses and pain the first two days of her period, and would like referral to OB/GYN. Discussed options for treatment including medications verse surgical. Answered questions and concerns. Referral placed.  Yehuda Savannah MD

## 2022-06-06 ENCOUNTER — Ambulatory Visit (INDEPENDENT_AMBULATORY_CARE_PROVIDER_SITE_OTHER): Payer: Medicaid Other | Admitting: Student

## 2022-06-06 ENCOUNTER — Encounter: Payer: Self-pay | Admitting: Student

## 2022-06-06 VITALS — BP 102/60 | HR 78 | Ht 63.0 in | Wt 233.0 lb

## 2022-06-06 DIAGNOSIS — D649 Anemia, unspecified: Secondary | ICD-10-CM

## 2022-06-06 DIAGNOSIS — D1722 Benign lipomatous neoplasm of skin and subcutaneous tissue of left arm: Secondary | ICD-10-CM | POA: Diagnosis not present

## 2022-06-06 NOTE — Assessment & Plan Note (Signed)
Improving with oral iron, continue. Recheck CBC. Appropriately referred to OB.

## 2022-06-06 NOTE — Patient Instructions (Signed)
It was great to see you today! Thank you for choosing [facility] for your primary care. Lynn Wilcox was seen for anemia follow-up and growth on left forearm.  Today we addressed: Anemia: Please continue taking the iron.  We are checking your hemoglobin to make sure that you are responding to the iron.  I am glad you are set up with OB/GYN. Growth on forearm: Lets get you scheduled in our Derm clinic for reassessment and potentially an excisional biopsy so they may evaluate further.  If you haven't already, sign up for My Chart to have easy access to your labs results, and communication with your primary care physician.  We are checking some labs today. If they are abnormal, I will call you. If they are normal, I will send you a MyChart message (if it is active) or a letter in the mail. If you do not hear about your labs in the next 2 weeks, please call the office. Call the clinic at [phone] if your symptoms worsen or you have any concerns.  You should return to our clinic No follow-ups on file. Please arrive 15 minutes before your appointment to ensure smooth check in process.  We appreciate your efforts in making this happen.  Thank you for allowing me to participate in your care, Wells Guiles, DO 06/06/2022, 4:24 PM PGY-2, [place]

## 2022-06-06 NOTE — Progress Notes (Signed)
  SUBJECTIVE:   CHIEF COMPLAINT / HPI:   Anemia: Suspected to be iron deficiency.  Low hemoglobin 9.8 and ferritin 5. Started oral iron every other day in which she has been compliant. States that for the last 10 years, her period has been heavier. In the last 1.5 years, she has had worsened fatigue which prompted evaluation. Pelvic U/S revealed two uterine masses suspected to be fibroids. She was referred to [facility] for further evaluation and has an appointment on November.   Growth on left forearm: States this was evaluated a few years ago when she first established with [name].  In charting, noted to be lipoma however patient states recently it has grown to about triple the size and it is concerning for her. Recalls she noticed how much it had grown   PERTINENT  PMH / PSH: GERD, asthma  OBJECTIVE:  BP 102/60   Pulse 78   Ht '5\' 3"'$  (1.6 m)   Wt 233 lb (105.7 kg)   LMP 05/24/2022 (Exact Date)   SpO2 98%   BMI 41.27 kg/m   General: NAD, pleasant, able to participate in exam Cardiac: RRR, no murmurs auscultated Respiratory: CTAB, normal WOB Extremities: 1.0 x 1.5 cm soft, nontender, mobile nodule on left volar forearm c/w lipoma vs. neurofibroma vs malignancy  ASSESSMENT/PLAN:  Anemia Improving with oral iron, continue. Recheck CBC. Appropriately referred to OB.   Lipoma of left forearm Discussed plans of monitor vs ultrasound vs excisional biopsy. Shared decision making, given growth, proceeded with referral to [facility] derm clinic for consideration of excisional biopsy.   Return for Assessment and excisional biopsy of left forearm growth. Wells Guiles, DO 06/06/2022, 10:29 PM PGY-2, [place]

## 2022-06-06 NOTE — Assessment & Plan Note (Signed)
Discussed plans of monitor vs ultrasound vs excisional biopsy. Shared decision making, given growth, proceeded with referral to [facility] derm clinic for consideration of excisional biopsy.

## 2022-06-07 LAB — CBC
Hematocrit: 33.1 % — ABNORMAL LOW (ref 34.0–46.6)
Hemoglobin: 10 g/dL — ABNORMAL LOW (ref 11.1–15.9)
MCH: 22.1 pg — ABNORMAL LOW (ref 26.6–33.0)
MCHC: 30.2 g/dL — ABNORMAL LOW (ref 31.5–35.7)
MCV: 73 fL — ABNORMAL LOW (ref 79–97)
Platelets: 280 x10E3/uL (ref 150–450)
RBC: 4.52 x10E6/uL (ref 3.77–5.28)
RDW: 17.4 % — ABNORMAL HIGH (ref 11.7–15.4)
WBC: 8 x10E3/uL (ref 3.4–10.8)

## 2022-06-08 ENCOUNTER — Encounter: Payer: Self-pay | Admitting: Student

## 2022-06-29 ENCOUNTER — Ambulatory Visit (INDEPENDENT_AMBULATORY_CARE_PROVIDER_SITE_OTHER): Payer: Medicaid Other | Admitting: Family Medicine

## 2022-06-29 VITALS — BP 124/70 | HR 69 | Temp 98.9°F | Ht 62.0 in | Wt 224.2 lb

## 2022-06-29 DIAGNOSIS — D1722 Benign lipomatous neoplasm of skin and subcutaneous tissue of left arm: Secondary | ICD-10-CM

## 2022-06-29 NOTE — Progress Notes (Signed)
    SUBJECTIVE:   CHIEF COMPLAINT / HPI:   Lipoma of L forearm Present for 5 years. Has grown larger over time. Is not painful but sometimes does feel slightly achy. Has not noticed any redness, warmth of the area. Has not had dedicated sick symptoms with it but does not remote history of shingles. Did have a similar lesion in her breast during pregnancy that was removed. She would like it taken out given it has continued to grow and be bothersome for her.  OBJECTIVE:   BP 124/70   Pulse 69   Temp 98.9 F (37.2 C)   Ht '5\' 2"'$  (1.575 m)   Wt 224 lb 3.2 oz (101.7 kg)   LMP 05/22/2022 (Approximate)   SpO2 100%   BMI 41.01 kg/m   General: Alert and oriented, in NAD Skin: Warm, dry. 1 cm round, mobile, spongy lesion under the skin on the L forearm HEENT: NCAT, EOM grossly normal, midline nasal septum Cardiac: Regular rate Respiratory: Breathing and speaking comfortably on RA Extremities: Moves all extremities grossly equally Neurological: No gross focal deficit Psychiatric: Appropriate mood and affect  ASSESSMENT/PLAN:   Lipoma of left forearm Soft, small, and mobile nature of lesion likely benign. Differential includes lipoma vs cyst. Appearance of lesion on ultrasound with bright white capsule, which would be atypical for lipoma. The lesion also appeared deeper in arm, potentially involved with musculature and vasculature, which may complicate removal in clinic. Because of this, will refer to general surgery for further evaluation. Patient amenable to plan.   Ethelene Hal, MD [place]

## 2022-06-29 NOTE — Patient Instructions (Signed)
It was great to see you today! Here's what we talked about:  We referred you to general surgery for evaluation of the mass on your arm. They will need to likely remove it given it appears deeper on ultrasound. Please let us know if you have not heard from them to schedule an appointment.  Please let me know if you have any other questions.  Dr. Marcha Dutton

## 2022-06-29 NOTE — Assessment & Plan Note (Signed)
Soft, small, and mobile nature of lesion likely benign. Differential includes lipoma vs cyst. Appearance of lesion on ultrasound with bright white capsule, which would be atypical for lipoma. The lesion also appeared deeper in arm, potentially involved with musculature and vasculature, which may complicate removal in clinic. Because of this, will refer to general surgery for further evaluation. Patient amenable to plan.

## 2022-06-29 NOTE — Addendum Note (Signed)
Addended by: Andrena Mews T on: 06/29/2022 03:43 PM   Modules accepted: Level of Service

## 2022-08-23 ENCOUNTER — Encounter: Payer: Self-pay | Admitting: Family Medicine

## 2022-08-23 ENCOUNTER — Encounter: Payer: Medicaid Other | Admitting: Family Medicine

## 2022-08-23 NOTE — Progress Notes (Signed)
Patient did not keep appointment today. She may call to reschedule.  

## 2022-10-26 ENCOUNTER — Ambulatory Visit (INDEPENDENT_AMBULATORY_CARE_PROVIDER_SITE_OTHER): Payer: Medicaid Other | Admitting: Family Medicine

## 2022-10-26 VITALS — BP 135/87 | HR 93 | Temp 98.6°F | Wt 234.0 lb

## 2022-10-26 DIAGNOSIS — J4521 Mild intermittent asthma with (acute) exacerbation: Secondary | ICD-10-CM | POA: Diagnosis present

## 2022-10-26 DIAGNOSIS — R059 Cough, unspecified: Secondary | ICD-10-CM

## 2022-10-26 DIAGNOSIS — Z8709 Personal history of other diseases of the respiratory system: Secondary | ICD-10-CM

## 2022-10-26 LAB — POC SOFIA 2 FLU + SARS ANTIGEN FIA
Influenza A, POC: NEGATIVE
Influenza B, POC: NEGATIVE
SARS Coronavirus 2 Ag: NEGATIVE

## 2022-10-26 MED ORDER — PROAIR HFA 108 (90 BASE) MCG/ACT IN AERS: INHALATION_SPRAY | RESPIRATORY_TRACT | 0 refills | Status: DC

## 2022-10-26 MED ORDER — PREDNISONE 20 MG PO TABS: 40.0000 mg | ORAL_TABLET | Freq: Every day | ORAL | 0 refills | Status: AC

## 2022-10-26 NOTE — Assessment & Plan Note (Signed)
On day 5 of symptoms.  COVID and flu testing.  Likely initiated by viral illness, but given the wheezing and persistent symptoms we will treat as an asthma exacerbation at this time. - Albuterol refill provided - Prednisone 40 mg daily x 5 days

## 2022-10-26 NOTE — Patient Instructions (Signed)
Your test was negative for COVID and flu, since you are having this wheezing it is not getting better I think we should go ahead and do a steroid treatment.  I will send then steroids we will take once daily for the next 5 days.  If you are not having any improvement to be started having acute shortness of breath, please make sure to either come in for evaluation or go to the ER or urgent care.

## 2022-10-26 NOTE — Progress Notes (Signed)
    SUBJECTIVE:   CHIEF COMPLAINT / HPI:   Wheezing and sickness - Patient has been sick since Sunday - Body aches and chills, cough, and sweating - Had sick contact of her child's father - Has a dry cough but a lot of mucous - Has albuterol inhaler PRN, has been using 2-4 puffs 4 times per day   PERTINENT  PMH / PSH: Mild intermittent asthma  OBJECTIVE:   BP 135/87   Pulse 93   Temp 98.6 F (37 C) (Oral)   Wt 234 lb (106.1 kg)   LMP 10/20/2022   SpO2 99%   BMI 42.80 kg/m   Gen: well-appearing, NAD CV: RRR, no m/r/g appreciated, no peripheral edema Pulm: No focal consolidations, wheezing present diffusely (worse in lower lobes), breathing comfortably on room air without retractions GI: soft, non-tender, non-distended  ASSESSMENT/PLAN:   Mild intermittent asthma with acute exacerbation On day 5 of symptoms.  COVID and flu testing.  Likely initiated by viral illness, but given the wheezing and persistent symptoms we will treat as an asthma exacerbation at this time. - Albuterol refill provided - Prednisone 40 mg daily x 5 days   Rise Patience, DO [place]

## 2022-10-27 ENCOUNTER — Other Ambulatory Visit: Payer: Self-pay

## 2022-10-27 ENCOUNTER — Telehealth: Payer: Self-pay

## 2022-10-27 NOTE — Telephone Encounter (Signed)
Patient calls nurse line regarding continued cough and wheezing. She states that she is not feeling any better today. Reports that voice is almost gone.  She is requesting work note for tomorrow as well.   She is requesting to return to work on Monday, 1/22.  Please advise.   Talbot Grumbling, RN

## 2022-10-27 NOTE — Telephone Encounter (Signed)
Called patient and provided with update. Patient appreciative. Letter printed and placed at front desk for pick up.   Talbot Grumbling, RN

## 2022-12-04 ENCOUNTER — Ambulatory Visit (INDEPENDENT_AMBULATORY_CARE_PROVIDER_SITE_OTHER): Payer: Medicaid Other | Admitting: Student

## 2022-12-04 VITALS — BP 126/82 | HR 88 | Ht 62.0 in | Wt 224.2 lb

## 2022-12-04 DIAGNOSIS — R112 Nausea with vomiting, unspecified: Secondary | ICD-10-CM | POA: Diagnosis not present

## 2022-12-04 DIAGNOSIS — D649 Anemia, unspecified: Secondary | ICD-10-CM

## 2022-12-04 NOTE — Assessment & Plan Note (Signed)
Possibly related to viral/bacterial gastroenteritis, overall improving without evidence of concern for pancreatitis or other etiology.  Liver enzymes were normal on check in the ED.  No hepatomegaly on exam while neurovascularly intact.  Abdominal exam unremarkable, hemodynamically stable.  Overall, she seems to be improving.  She can continue with Phenergan as needed and continue to remain hydrated.  Strict return precautions were given.  Lytes at outside facility were within normal limits.

## 2022-12-04 NOTE — Progress Notes (Signed)
  SUBJECTIVE:   CHIEF COMPLAINT / HPI:   Vomiting  Abdominal Pain: Vomiting, diarrhea, and abdominal pain started on Wednesday of last week, had last episode on Saturday night  No hematochezia or hematemesis Went to the ED Thursday night, got fluids and was sent home. Labs at that time overall unremarkable. Received Phenergan that she took for three days and then stopped  Patient was able to eat broth last night and today, has been gradually improving  No sick contacts  Had sweet tea the day it happened and has really bad GERD, thinks this is related  [place] with no recent abx  Good fluid intake   PERTINENT  PMH / PSH:  GERD Carpal tunnel  Anemia    OBJECTIVE:  BP 126/82   Pulse 88   Ht 5' 2"$  (1.575 m)   Wt 224 lb 3.2 oz (101.7 kg)   LMP 11/23/2022   SpO2 99%   BMI 41.01 kg/m  Physical Exam  General: Alert and oriented in no apparent distress Heart: Regular rate and rhythm with no murmurs appreciated Lungs: CTA bilaterally, no wheezing Abdomen: Bowel sounds present, no abdominal pain, non-distended, soft, Diffuse tenderness without guarding or rebound  Skin: Warm and dry Extremities: No lower extremity edema  ASSESSMENT/PLAN:  Anemia, unspecified type Assessment & Plan: Due for repeat iron to check stores, has been on PO for several months. Hgb at ED [place] 11.2.  Iron panel ordered   Orders: -     Iron, TIBC and Ferritin Panel  Nausea and vomiting, unspecified vomiting type Assessment & Plan: Possibly related to viral/bacterial gastroenteritis, overall improving without evidence of concern for pancreatitis or other etiology.  Liver enzymes were normal on check in the ED.  No hepatomegaly on exam while neurovascularly intact.  Abdominal exam unremarkable, hemodynamically stable.  Overall, she seems to be improving.  She can continue with Phenergan as needed and continue to remain hydrated.  Strict return precautions were given.  Lytes at  outside facility were within normal limits.    Return if symptoms worsen or fail to improve. [name], MD 12/04/2022, 11:45 AM PGY-2, [place]

## 2022-12-04 NOTE — Assessment & Plan Note (Signed)
Due for repeat iron to check stores, has been on PO for several months. Hgb at ED [place] 11.2.  Iron panel ordered

## 2022-12-04 NOTE — Patient Instructions (Addendum)
It was great to see you today! Thank you for choosing [facility] for your primary care. Lynn Wilcox was seen for sick visit.  Today we addressed: Stay hydrated, I will call with lab results  Use Phenergan as needed  If symptoms return, please let us know   If you haven't already, sign up for My Chart to have easy access to your labs results, and communication with your primary care physician.  I recommend that you always bring your medications to each appointment as this makes it easy to ensure you are on the correct medications and helps [name] not miss refills when you need them. Call the clinic at [phone] if your symptoms worsen or you have any concerns.  You should return to our clinic Return if symptoms worsen or fail to improve. Please arrive 15 minutes before your appointment to ensure smooth check in process.  We appreciate your efforts in making this happen.  Thank you for allowing me to participate in your care, Lynn Emery, MD 12/04/2022, 11:01 AM PGY-2, [place]

## 2022-12-05 LAB — IRON,TIBC AND FERRITIN PANEL
Ferritin: 11 ng/mL — ABNORMAL LOW (ref 15–150)
Iron Saturation: 7 % — CL (ref 15–55)
Iron: 32 ug/dL (ref 27–159)
Total Iron Binding Capacity: 470 ug/dL — ABNORMAL HIGH (ref 250–450)
UIBC: 438 ug/dL — ABNORMAL HIGH (ref 131–425)

## 2022-12-11 ENCOUNTER — Telehealth: Payer: Self-pay

## 2022-12-11 ENCOUNTER — Encounter: Payer: Self-pay | Admitting: Student

## 2022-12-11 DIAGNOSIS — D649 Anemia, unspecified: Secondary | ICD-10-CM

## 2022-12-11 MED ORDER — FERROUS SULFATE 324 (65 FE) MG PO TBEC: 1.0000 | DELAYED_RELEASE_TABLET | ORAL | 2 refills | Status: DC

## 2022-12-11 NOTE — Telephone Encounter (Signed)
Patient returns call to nurse line.   Patient advised of results. Patient reports she was taking iron every other day, however reports she ran out ~ 2 weeks ago and has not been taking since.   Will forward to PCP for a refill.

## 2022-12-11 NOTE — Telephone Encounter (Signed)
Patient advised.

## 2023-03-13 ENCOUNTER — Telehealth: Payer: Self-pay | Admitting: Gastroenterology

## 2023-03-13 NOTE — Telephone Encounter (Signed)
Patient caled states she has been with really bad Gerd symptoms since Saturday seeking advise.

## 2023-03-14 ENCOUNTER — Encounter (HOSPITAL_BASED_OUTPATIENT_CLINIC_OR_DEPARTMENT_OTHER): Payer: Self-pay | Admitting: Emergency Medicine

## 2023-03-14 ENCOUNTER — Emergency Department (HOSPITAL_BASED_OUTPATIENT_CLINIC_OR_DEPARTMENT_OTHER)
Admission: EM | Admit: 2023-03-14 | Discharge: 2023-03-15 | Disposition: A | Discharge status: Home / Self Care | Payer: Medicaid Other | Attending: Emergency Medicine | Admitting: Emergency Medicine

## 2023-03-14 DIAGNOSIS — Z79899 Other long term (current) drug therapy: Secondary | ICD-10-CM | POA: Diagnosis not present

## 2023-03-14 DIAGNOSIS — R112 Nausea with vomiting, unspecified: Secondary | ICD-10-CM

## 2023-03-14 DIAGNOSIS — E876 Hypokalemia: Secondary | ICD-10-CM | POA: Diagnosis not present

## 2023-03-14 LAB — CBC
HCT: 38 % (ref 36.0–46.0)
Hemoglobin: 12 g/dL (ref 12.0–15.0)
MCH: 21.5 pg — ABNORMAL LOW (ref 26.0–34.0)
MCHC: 31.6 g/dL (ref 30.0–36.0)
MCV: 68.2 fL — ABNORMAL LOW (ref 80.0–100.0)
Platelets: 313 10*3/uL (ref 150–400)
RBC: 5.57 MIL/uL — ABNORMAL HIGH (ref 3.87–5.11)
RDW: 16.8 % — ABNORMAL HIGH (ref 11.5–15.5)
WBC: 10.3 10*3/uL (ref 4.0–10.5)
nRBC: 0 % (ref 0.0–0.2)

## 2023-03-14 LAB — COMPREHENSIVE METABOLIC PANEL
ALT: 10 U/L (ref 0–44)
AST: 11 U/L — ABNORMAL LOW (ref 15–41)
Albumin: 4.4 g/dL (ref 3.5–5.0)
Alkaline Phosphatase: 43 U/L (ref 38–126)
Anion gap: 14 (ref 5–15)
BUN: 9 mg/dL (ref 6–20)
CO2: 30 mmol/L (ref 22–32)
Calcium: 9.6 mg/dL (ref 8.9–10.3)
Chloride: 90 mmol/L — ABNORMAL LOW (ref 98–111)
Creatinine, Ser: 0.76 mg/dL (ref 0.44–1.00)
GFR, Estimated: >60 mL/min (ref >60)
Glucose, Bld: 106 mg/dL — ABNORMAL HIGH (ref 70–99)
Potassium: 2.9 mmol/L — ABNORMAL LOW (ref 3.5–5.1)
Sodium: 134 mmol/L — ABNORMAL LOW (ref 135–145)
Total Bilirubin: 0.5 mg/dL (ref 0.3–1.2)
Total Protein: 8.2 g/dL — ABNORMAL HIGH (ref 6.5–8.1)

## 2023-03-14 LAB — URINALYSIS, ROUTINE W REFLEX MICROSCOPIC
Bacteria, UA: NONE SEEN
Bilirubin Urine: NEGATIVE
Glucose, UA: NEGATIVE mg/dL
Ketones, ur: >80 mg/dL — AB
Leukocytes,Ua: NEGATIVE
Nitrite: NEGATIVE
Protein, ur: 30 mg/dL — AB
RBC / HPF: >50 RBC/hpf (ref 0–5)
Specific Gravity, Urine: 1.017 (ref 1.005–1.030)
pH: 8 (ref 5.0–8.0)

## 2023-03-14 LAB — RAPID URINE DRUG SCREEN, HOSP PERFORMED
Amphetamines: NOT DETECTED
Barbiturates: NOT DETECTED
Benzodiazepines: NOT DETECTED
Cocaine: NOT DETECTED
Opiates: NOT DETECTED
Tetrahydrocannabinol: POSITIVE — AB

## 2023-03-14 LAB — TROPONIN I (HIGH SENSITIVITY): Troponin I (High Sensitivity): 6 ng/L (ref <18)

## 2023-03-14 LAB — PREGNANCY, URINE: Preg Test, Ur: NEGATIVE

## 2023-03-14 LAB — LIPASE, BLOOD: Lipase: 11 U/L (ref 11–51)

## 2023-03-14 MED ORDER — ONDANSETRON HCL 4 MG/2ML IJ SOLN
4.0000 mg | Freq: Once | INTRAMUSCULAR | Status: AC | PRN
  Administered 2023-03-14: 4 mg via INTRAVENOUS
  Filled 2023-03-14: qty 2

## 2023-03-14 MED ORDER — SODIUM CHLORIDE 0.9 % IV SOLN
12.5000 mg | Freq: Four times a day (QID) | INTRAVENOUS | Status: DC | PRN
  Administered 2023-03-14: 12.5 mg via INTRAVENOUS
  Filled 2023-03-14: qty 0.5

## 2023-03-14 MED ORDER — DROPERIDOL 2.5 MG/ML IJ SOLN
1.2500 mg | Freq: Once | INTRAMUSCULAR | Status: AC
  Administered 2023-03-14: 1.25 mg via INTRAVENOUS
  Filled 2023-03-14: qty 2

## 2023-03-14 MED ORDER — SODIUM CHLORIDE 0.9 % IV BOLUS
1000.0000 mL | Freq: Once | INTRAVENOUS | Status: AC
  Administered 2023-03-14: 1000 mL via INTRAVENOUS

## 2023-03-14 MED ORDER — POTASSIUM CHLORIDE 10 MEQ/100ML IV SOLN
10.0000 meq | INTRAVENOUS | Status: AC
  Administered 2023-03-14 (×2): 10 meq via INTRAVENOUS
  Filled 2023-03-14 (×2): qty 100

## 2023-03-14 MED ORDER — PROMETHAZINE HCL 25 MG/ML IJ SOLN
INTRAMUSCULAR | Status: AC
  Filled 2023-03-14: qty 1

## 2023-03-14 NOTE — ED Provider Notes (Signed)
  Provider Note MRN:  [id number]  Arrival date & time: 03/15/23    ED Course and Medical Decision Making  Assumed care from PA Henderly at shift change.  Acute on chronic nausea vomiting unclear source, recent outpatient CT a few days ago was unremarkable.  Still having symptoms, will reassess after droperidol.  1 AM update: On my assessment patient looks well with normal vitals, soft abdomen.  Patient feeling a bit better and feels comfortable going home, will provide further prescription assistance for home, she will follow-up with GI.  Procedures  Final Clinical Impressions(s) / ED Diagnoses     ICD-10-CM   1. Nausea and vomiting, unspecified vomiting type  R11.2     2. Hypokalemia  E87.6       ED Discharge Orders          Ordered    potassium chloride SA (KLOR-CON M) 20 MEQ tablet  2 times daily        03/15/23 0101    promethazine (PHENERGAN) 25 MG suppository  Every 6 hours PRN        03/15/23 0101              Discharge Instructions      You were evaluated in the Emergency Department and after careful evaluation, we did not find any emergent condition requiring admission or further testing in the hospital.  Your exam/testing today is overall reassuring.  Continue using your home Zofran as needed for nausea.  You can use the Phenergan suppositories also if needed.  Take the potassium pills as directed.  Follow-up with GI.  Please return to the Emergency Department if you experience any worsening of your condition.   Thank you for allowing [name] to be a part of your care.      Elmer Sow. Pilar Plate, MD [facility] [email or web address]    Sabas Sous, MD 03/15/23 [phone]

## 2023-03-14 NOTE — Telephone Encounter (Signed)
The pt has been advised that she has not been seen here since 2021 and will need to make an appt with our office to discuss.  She was told that Dr Orvan Falconer is not here any longer. She will review our providers and call back to make an appt for f/u and call her PCP for advise in the meantime.

## 2023-03-14 NOTE — ED Notes (Signed)
Pt took sips of water.

## 2023-03-14 NOTE — Discharge Instructions (Addendum)
You were evaluated in the Emergency Department and after careful evaluation, we did not find any emergent condition requiring admission or further testing in the hospital.  Your exam/testing today is overall reassuring.  Continue using your home Zofran as needed for nausea.  You can use the Phenergan suppositories also if needed.  Take the potassium pills as directed.  Follow-up with GI.  Please return to the Emergency Department if you experience any worsening of your condition.   Thank you for allowing [name] to be a part of your care.

## 2023-03-14 NOTE — ED Notes (Signed)
Pt have been given ice water

## 2023-03-14 NOTE — ED Triage Notes (Signed)
Ate something "greasy" on Saturday. Increase GERD symptoms, vomiting, unable to keep food down. Seen at ed on 6/2, symptoms not relieved with meds

## 2023-03-14 NOTE — ED Notes (Signed)
Pt asked again for a urine sample but says she still does not need to go.

## 2023-03-14 NOTE — Telephone Encounter (Signed)
Left message on machine to call back  

## 2023-03-14 NOTE — ED Provider Notes (Signed)
[place] EMERGENCY DEPARTMENT AT [facility] Provider Note   CSN: [id number] Arrival date & time: 03/14/23  1632     History  Chief Complaint  Patient presents with   Emesis    Lynn Wilcox is a 42 y.o. female history of GERD, recurrent nausea and vomiting previously followed by gastroenterology here for evaluation of nausea and vomiting.  Began 3 days ago after eating a greasy fried meal.  Began to have pain to her epigastric region, belching and persistent NBNB emesis.  Was seen in the emergency department at outside hospital at that time which had CT scan and labs performed which did not show significant findings.  She has been taking ODT Zofran at home without relief.  She still has been unable to keep down any liquids or foods.  No chronic EtOH use, NSAID use.  No blood in emesis.  Last bowel movement 2 days ago.  No melena or bright red blood per rectum.  Passing flatus.  No fever, chest pain, shortness of breath, back pain, dysuria or hematuria. Denies chance of pregnancy. She has noted significant decrease in urination which she relates to her multiple episodes of emesis.   Patient states she has frequent bouts of recurrent nausea and vomiting, typically goes to freestanding "IV clinics" to get hydration, feels improved.  HPI     Home Medications Prior to Admission medications   Medication Sig Start Date End Date Taking? Authorizing Provider  cetirizine (ZYRTEC) 10 MG tablet TAKE 1 TABLET BY MOUTH DAILY 07/07/20   [name], DO  famotidine (PEPCID) 40 MG tablet Take 1 tablet (40 mg total) by mouth daily. 07/19/20   Tressia Danas, MD  ferrous sulfate 324 (65 Fe) MG TBEC Take 1 tablet (325 mg total) by mouth every other day. 12/11/22   Alfredo Martinez, MD  ibuprofen (ADVIL,MOTRIN) 600 MG tablet Take 1 tablet (600 mg total) by mouth every 6 (six) hours as needed for mild pain, moderate pain or cramping. 08/24/13   [name], CNM  PROAIR HFA 108 [phone] Base)  MCG/ACT inhaler INHALE 2 PUFFS INTO THE LUNGS EVERY 6 HOURS AS NEEDED FOR WHEEZING 10/26/22   Lilland, Alana, DO  valACYclovir (VALTREX) 1000 MG tablet Take 1,000 mg by mouth 3 (three) times daily. 05/01/22   [provider]      Allergies    Chocolate, Coffee bean extract [coffea arabica], Soy allergy, Tea, and Yeast-related products    Review of Systems   Review of Systems  Constitutional: Negative.   HENT: Negative.    Respiratory: Negative.    Cardiovascular: Negative.   Gastrointestinal:  Positive for abdominal pain, nausea and vomiting. Negative for abdominal distention, anal bleeding, blood in stool, constipation, diarrhea and rectal pain.  Genitourinary: Negative.   Musculoskeletal: Negative.   Neurological: Negative.   All other systems reviewed and are negative.   Physical Exam Updated Vital Signs BP 115/68   Pulse 69   Temp 98.3 F (36.8 C) (Oral)   Resp 16   LMP 03/10/2023   SpO2 98%  Physical Exam Vitals and nursing note reviewed.  Constitutional:      General: She is not in acute distress.    Appearance: She is well-developed. She is not ill-appearing, toxic-appearing or diaphoretic.  HENT:     Head: Normocephalic and atraumatic.     Nose: Nose normal.     Mouth/Throat:     Mouth: Mucous membranes are dry.  Eyes:     Pupils: Pupils are equal,  round, and reactive to light.  Cardiovascular:     Rate and Rhythm: Normal rate.     Pulses: Normal pulses.     Heart sounds: Normal heart sounds.  Pulmonary:     Effort: Pulmonary effort is normal. No respiratory distress.     Breath sounds: Normal breath sounds.  Abdominal:     General: Bowel sounds are normal. There is no distension.     Palpations: Abdomen is soft.     Tenderness: There is abdominal tenderness. There is no right CVA tenderness, left CVA tenderness, guarding or rebound.     Hernia: No hernia is present.     Comments: Diffuse tenderness worse to upper abdomen epigastric and left upper  quadrant.  Musculoskeletal:        General: Normal range of motion.     Cervical back: Normal range of motion.     Comments: No bony tenderness, full range of motion, compartment soft  Skin:    General: Skin is warm and dry.     Capillary Refill: Capillary refill takes less than 2 seconds.     Comments: No edema, erythema or warmth  Neurological:     General: No focal deficit present.     Mental Status: She is alert.  Psychiatric:        Mood and Affect: Mood normal.     ED Results / Procedures / Treatments   Labs (all labs ordered are listed, but only abnormal results are displayed) Labs Reviewed  COMPREHENSIVE METABOLIC PANEL - Abnormal; Notable for the following components:      Result Value   Sodium 134 (*)    Potassium 2.9 (*)    Chloride 90 (*)    Glucose, Bld 106 (*)    Total Protein 8.2 (*)    AST 11 (*)    All other components within normal limits  CBC - Abnormal; Notable for the following components:   RBC 5.57 (*)    MCV 68.2 (*)    MCH 21.5 (*)    RDW 16.8 (*)    All other components within normal limits  URINALYSIS, ROUTINE W REFLEX MICROSCOPIC - Abnormal; Notable for the following components:   Hgb urine dipstick LARGE (*)    Ketones, ur >80 (*)    Protein, ur 30 (*)    All other components within normal limits  LIPASE, BLOOD  PREGNANCY, URINE  RAPID URINE DRUG SCREEN, HOSP PERFORMED  TROPONIN I (HIGH SENSITIVITY)    EKG EKG Interpretation  Date/Time:  Wednesday March 14 2023 16:43:51 EDT Ventricular Rate:  63 PR Interval:  132 QRS Duration: 84 QT Interval:  402 QTC Calculation: 411 R Axis:   66 Text Interpretation: Normal sinus rhythm Normal ECG No previous ECGs available Confirmed by Alvino Blood ([id number]) on 03/14/2023 6:23:00 PM  Radiology No results found.   03/11/2023 2:17 PM EDT  INDICATION: Nausea Vomiting COMPARISON:  None.   TECHNIQUE:  CT ABDOMEN PELVIS W IV CONTRAST - Contrast: 75 mL  IOPAMIDOL 76 % IV SOLN. Dose reduction  was utilized (automated exposure control, mA or kV adjustment based on patient size, or iterative image reconstruction).  FINDINGS:  SOLID VISCERA/BILIARY: - Liver: Normal. - Biliary: No acute gallbladder abnormality. - Pancreas: Normal. - Adrenal glands: Normal. - Spleen: Normal. - Kidneys: Normal.  GI: - No bowel obstruction. - No acute appendicitis.  PERITONEAL CAVITY/RETROPERITONEUM: - No free fluid. - No pneumoperitoneum. - No lymphadenopathy. - No acute vascular abnormalities.  PELVIS: - No acute abnormalities.  VISUALIZED LOWER THORAX: - No acute abnormalities.  MUSCULOSKELETAL: - No acute or destructive osseous processes.  MISC: - Fatty umbilical hernia.   Procedures Procedures    Medications Ordered in ED Medications  promethazine (PHENERGAN) 12.5 mg in sodium chloride 0.9 % 50 mL IVPB (0 mg Intravenous Stopped 03/14/23 1944)  promethazine (PHENERGAN) 25 MG/ML injection (  Not Given 03/14/23 2055)  ondansetron (ZOFRAN) injection 4 mg (4 mg Intravenous Given 03/14/23 1654)  potassium chloride 10 mEq in 100 mL IVPB (0 mEq Intravenous Stopped 03/14/23 2129)  sodium chloride 0.9 % bolus 1,000 mL (0 mLs Intravenous Stopped 03/14/23 2129)  sodium chloride 0.9 % bolus 1,000 mL (0 mLs Intravenous Stopped 03/14/23 2129)  droperidol (INAPSINE) 2.5 MG/ML injection 1.25 mg (1.25 mg Intravenous Given 03/14/23 2336)    ED Course/ Medical Decision Making/ A&P   42 year old here for evaluation of persistent nausea, vomiting.  History of GERD and recurrent episodes of nausea and vomiting however lost to follow-up with gastroenterology.  Episode over the last 3 days.  She was seen outside hospital for state of symptom onset where she had labs as well as CT scan performed which did not show significant findings.  Still having emesis despite ODT Zofran at home.  She denies any EtOH use, marijuana use, chronic NSAID use.  No bloody stool or blood in emesis.  Pain epigastric and left upper  abdomen however no rebound or guarding.  No overlying skin changes.  No overt chest pain.  Will plan on labs, symptomatic management and reassess  Labs personally viewed and interpreted:  CBC without leukocytosis Metabolic panel sodium 134, potassium 2.9 Lipase 11 UA neg for infection Pregnancy test neg Troponin 6 EKG without ischemic changes, QTc 411  Patient reassessed, symptoms improved.  Will plan on p.o. challenge  Patient reassessed. Multiple episodes of emesis after PO challenge. Will give droperidol  Care transferred to Dr. Pilar Plate who will follow-up on reassessment.  Disposition pending reassessment.                             Medical Decision Making Amount and/or Complexity of Data Reviewed External Data Reviewed: labs, radiology, ECG and notes. Labs: ordered. Decision-making details documented in ED Course. ECG/medicine tests: ordered and independent interpretation performed. Decision-making details documented in ED Course.  Risk OTC drugs. Prescription drug management. Parenteral controlled substances. Decision regarding hospitalization. Diagnosis or treatment significantly limited by social determinants of health.          Final Clinical Impression(s) / ED Diagnoses Final diagnoses:  Nausea and vomiting, unspecified vomiting type  Hypokalemia    Rx / DC Orders ED Discharge Orders     None         Abdulraheem Pineo A, PA-C 03/14/23 2351    Lonell Grandchild, MD 03/15/23 1243

## 2023-03-15 MED ORDER — POTASSIUM CHLORIDE CRYS ER 20 MEQ PO TBCR: 20.0000 meq | EXTENDED_RELEASE_TABLET | Freq: Two times a day (BID) | ORAL | 0 refills | Status: DC

## 2023-03-15 MED ORDER — PROMETHAZINE HCL 25 MG RE SUPP: 25.0000 mg | Freq: Four times a day (QID) | RECTAL | 0 refills | Status: DC | PRN

## 2023-04-08 ENCOUNTER — Other Ambulatory Visit: Payer: Self-pay

## 2023-04-08 ENCOUNTER — Encounter (HOSPITAL_BASED_OUTPATIENT_CLINIC_OR_DEPARTMENT_OTHER): Payer: Self-pay | Admitting: Emergency Medicine

## 2023-04-08 ENCOUNTER — Emergency Department (HOSPITAL_BASED_OUTPATIENT_CLINIC_OR_DEPARTMENT_OTHER)
Admission: EM | Admit: 2023-04-08 | Discharge: 2023-04-08 | Disposition: A | Discharge status: Home / Self Care | Payer: Medicaid Other | Attending: Emergency Medicine | Admitting: Emergency Medicine

## 2023-04-08 DIAGNOSIS — E860 Dehydration: Secondary | ICD-10-CM

## 2023-04-08 DIAGNOSIS — R112 Nausea with vomiting, unspecified: Secondary | ICD-10-CM | POA: Diagnosis present

## 2023-04-08 DIAGNOSIS — E876 Hypokalemia: Secondary | ICD-10-CM | POA: Diagnosis not present

## 2023-04-08 DIAGNOSIS — R1013 Epigastric pain: Secondary | ICD-10-CM | POA: Insufficient documentation

## 2023-04-08 LAB — COMPREHENSIVE METABOLIC PANEL
ALT: 12 U/L (ref 0–44)
AST: 15 U/L (ref 15–41)
Albumin: 4.1 g/dL (ref 3.5–5.0)
Alkaline Phosphatase: 46 U/L (ref 38–126)
Anion gap: 12 (ref 5–15)
BUN: 10 mg/dL (ref 6–20)
CO2: 30 mmol/L (ref 22–32)
Calcium: 9.6 mg/dL (ref 8.9–10.3)
Chloride: 90 mmol/L — ABNORMAL LOW (ref 98–111)
Creatinine, Ser: 0.77 mg/dL (ref 0.44–1.00)
GFR, Estimated: >60 mL/min (ref >60)
Glucose, Bld: 105 mg/dL — ABNORMAL HIGH (ref 70–99)
Potassium: 3 mmol/L — ABNORMAL LOW (ref 3.5–5.1)
Sodium: 132 mmol/L — ABNORMAL LOW (ref 135–145)
Total Bilirubin: 0.5 mg/dL (ref 0.3–1.2)
Total Protein: 8.2 g/dL — ABNORMAL HIGH (ref 6.5–8.1)

## 2023-04-08 LAB — CBC
HCT: 34.4 % — ABNORMAL LOW (ref 36.0–46.0)
Hemoglobin: 10.8 g/dL — ABNORMAL LOW (ref 12.0–15.0)
MCH: 21.2 pg — ABNORMAL LOW (ref 26.0–34.0)
MCHC: 31.4 g/dL (ref 30.0–36.0)
MCV: 67.6 fL — ABNORMAL LOW (ref 80.0–100.0)
Platelets: 262 10*3/uL (ref 150–400)
RBC: 5.09 MIL/uL (ref 3.87–5.11)
RDW: 17.1 % — ABNORMAL HIGH (ref 11.5–15.5)
WBC: 9.9 10*3/uL (ref 4.0–10.5)
nRBC: 0 % (ref 0.0–0.2)

## 2023-04-08 LAB — LIPASE, BLOOD: Lipase: 15 U/L (ref 11–51)

## 2023-04-08 LAB — MAGNESIUM: Magnesium: 2 mg/dL (ref 1.7–2.4)

## 2023-04-08 MED ORDER — POTASSIUM CHLORIDE CRYS ER 20 MEQ PO TBCR: 20.0000 meq | EXTENDED_RELEASE_TABLET | Freq: Two times a day (BID) | ORAL | 0 refills | Status: DC

## 2023-04-08 MED ORDER — HALOPERIDOL LACTATE 5 MG/ML IJ SOLN
5.0000 mg | Freq: Once | INTRAMUSCULAR | Status: AC
  Administered 2023-04-08: 5 mg via INTRAVENOUS
  Filled 2023-04-08: qty 1

## 2023-04-08 MED ORDER — POTASSIUM CHLORIDE 10 MEQ/100ML IV SOLN
10.0000 meq | INTRAVENOUS | Status: AC
  Administered 2023-04-08 (×2): 10 meq via INTRAVENOUS
  Filled 2023-04-08 (×2): qty 100

## 2023-04-08 MED ORDER — ALUM & MAG HYDROXIDE-SIMETH 200-200-20 MG/5ML PO SUSP
30.0000 mL | Freq: Once | ORAL | Status: AC
  Administered 2023-04-08: 30 mL via ORAL
  Filled 2023-04-08: qty 30

## 2023-04-08 MED ORDER — ONDANSETRON 8 MG PO TBDP: 8.0000 mg | ORAL_TABLET | Freq: Three times a day (TID) | ORAL | 0 refills | Status: DC | PRN

## 2023-04-08 MED ORDER — ONDANSETRON HCL 4 MG/2ML IJ SOLN
4.0000 mg | Freq: Once | INTRAMUSCULAR | Status: AC | PRN
  Administered 2023-04-08: 4 mg via INTRAVENOUS
  Filled 2023-04-08: qty 2

## 2023-04-08 MED ORDER — POTASSIUM CHLORIDE CRYS ER 20 MEQ PO TBCR
20.0000 meq | EXTENDED_RELEASE_TABLET | Freq: Once | ORAL | Status: AC
  Administered 2023-04-08: 20 meq via ORAL
  Filled 2023-04-08: qty 1

## 2023-04-08 MED ORDER — LACTATED RINGERS IV BOLUS
1000.0000 mL | Freq: Once | INTRAVENOUS | Status: AC
  Administered 2023-04-08: 1000 mL via INTRAVENOUS

## 2023-04-08 NOTE — Discharge Instructions (Addendum)
Take the medications prescribed for your nausea and vomiting. As discussed, initiate your diet with clear liquid diet followed by soft diet and avoid processed foods for the next 5 to 7 days.  Return to the emergency room if you start having severe abdominal pain, severe nausea and vomiting.

## 2023-04-08 NOTE — ED Provider Notes (Signed)
[place] EMERGENCY DEPARTMENT AT [facility] Provider Note   CSN: [id number] Arrival date & time: 04/08/23  0945     History  No chief complaint on file.   Lynn Wilcox is a 42 y.o. female.  HPI    42 year old female comes in with chief complaint of nausea, vomiting, weakness. Patient states that 3 days ago, her AC went out.  Her home got pretty hot, she has been hydrating herself.  However she started feeling weak and has been having nausea and emesis since then.  She has had about 5 episodes of emesis in the last 24 hours, nonbilious.  She has no appetite and anytime she eats or drinks something, she will often vomit.  She has generalized upper quadrant abdominal pain.  Patient denies any UTI-like symptoms.  She has no history of abdominal surgeries. Pt doesn't think she is pregnant.  Home Medications Prior to Admission medications   Medication Sig Start Date End Date Taking? Authorizing Provider  ondansetron (ZOFRAN-ODT) 8 MG disintegrating tablet Take 1 tablet (8 mg total) by mouth every 8 (eight) hours as needed for nausea. 04/08/23  Yes Vinaya Sancho, MD  potassium chloride SA (KLOR-CON M) 20 MEQ tablet Take 1 tablet (20 mEq total) by mouth 2 (two) times daily. 04/08/23  Yes Derwood Kaplan, MD  cetirizine (ZYRTEC) 10 MG tablet TAKE 1 TABLET BY MOUTH DAILY 07/07/20   [name], DO  famotidine (PEPCID) 40 MG tablet Take 1 tablet (40 mg total) by mouth daily. 07/19/20   Tressia Danas, MD  ferrous sulfate 324 (65 Fe) MG TBEC Take 1 tablet (325 mg total) by mouth every other day. 12/11/22   Alfredo Martinez, MD  ibuprofen (ADVIL,MOTRIN) 600 MG tablet Take 1 tablet (600 mg total) by mouth every 6 (six) hours as needed for mild pain, moderate pain or cramping. 08/24/13   [name], CNM  PROAIR HFA 108 [phone] Base) MCG/ACT inhaler INHALE 2 PUFFS INTO THE LUNGS EVERY 6 HOURS AS NEEDED FOR WHEEZING 10/26/22   Lilland, Alana, DO  promethazine (PHENERGAN) 25 MG  suppository Place 1 suppository (25 mg total) rectally every 6 (six) hours as needed for nausea or vomiting. 03/15/23   Sabas Sous, MD  valACYclovir (VALTREX) 1000 MG tablet Take 1,000 mg by mouth 3 (three) times daily. 05/01/22   [provider]      Allergies    Chocolate, Coffee bean extract [coffea arabica], Soy allergy, Tea, and Yeast-related products    Review of Systems   Review of Systems  All other systems reviewed and are negative.   Physical Exam Updated Vital Signs BP 137/85   Pulse 72   Temp 98.9 F (37.2 C) (Oral)   Resp 16   LMP 03/10/2023   SpO2 100%  Physical Exam Vitals and nursing note reviewed.  Constitutional:      Appearance: She is well-developed.  HENT:     Head: Atraumatic.  Cardiovascular:     Rate and Rhythm: Normal rate.  Pulmonary:     Effort: Pulmonary effort is normal.  Abdominal:     Palpations: Abdomen is soft.     Tenderness: There is abdominal tenderness. There is no guarding or rebound.     Comments: Epigastric abdominal tenderness without rebound or guarding  Musculoskeletal:     Cervical back: Normal range of motion and neck supple.  Skin:    General: Skin is warm and dry.  Neurological:     Mental Status: She is alert and oriented  to person, place, and time.     ED Results / Procedures / Treatments   Labs (all labs ordered are listed, but only abnormal results are displayed) Labs Reviewed  COMPREHENSIVE METABOLIC PANEL - Abnormal; Notable for the following components:      Result Value   Sodium 132 (*)    Potassium 3.0 (*)    Chloride 90 (*)    Glucose, Bld 105 (*)    Total Protein 8.2 (*)    All other components within normal limits  CBC - Abnormal; Notable for the following components:   Hemoglobin 10.8 (*)    HCT 34.4 (*)    MCV 67.6 (*)    MCH 21.2 (*)    RDW 17.1 (*)    All other components within normal limits  LIPASE, BLOOD  MAGNESIUM    EKG None  Radiology No results  found.  Procedures Procedures    Medications Ordered in ED Medications  ondansetron (ZOFRAN) injection 4 mg (4 mg Intravenous Given 04/08/23 1053)  lactated ringers bolus 1,000 mL (0 mLs Intravenous Stopped 04/08/23 1407)  alum & mag hydroxide-simeth (MAALOX/MYLANTA) 200-200-20 MG/5ML suspension 30 mL (30 mLs Oral Given 04/08/23 1129)  potassium chloride 10 mEq in 100 mL IVPB (0 mEq Intravenous Stopped 04/08/23 1510)  potassium chloride SA (KLOR-CON M) CR tablet 20 mEq (20 mEq Oral Given 04/08/23 1218)  haloperidol lactate (HALDOL) injection 5 mg (5 mg Intravenous Given 04/08/23 1332)    ED Course/ Medical Decision Making/ A&P                             Medical Decision Making Amount and/or Complexity of Data Reviewed Labs: ordered.  Risk OTC drugs. Prescription drug management.   This patient presents to the ED with chief complaint(s) of abdominal pain, nausea, vomiting, malaise, diarrhea with pertinent past medical history of heat exposure.patient denies any URI-like symptoms.  She denies any sick exposures and denies any fevers or chills.  The complaint involves an extensive differential diagnosis and also carries with it a high risk of complications and morbidity.    The differential diagnosis includes  Pancreatitis, Hepatobiliary pathology including cholelithiasis and cholecystitis, Gastritis/peptic ulcer disease, small bowel obstruction, Acute coronary syndrome, viral illness, gastroparesis, toxic effects from toxin use, COVID-19.  The initial plan is to get basic labs and reassess the patient.  Patient's abdominal exam is overall reassuring, no indication for emergent CT or ultrasound.   Independent labs interpretation:  The following labs were independently interpreted: Patient's blood workup is normal and reassuring besides hypokalemia.  No leukocytosis.  Treatment and Reassessment: Pt reassessed.  Feels better, but still has nausea. Haldol  prescribed.  Reassessment: Pt reassessed. Pt's VSS and WNL. Pt's cap refill < 3 seconds. Pt has been hydrated in the ER and now passed po challenge. We will discharge with antiemetic. Strict ER return precautions have been discussed and pt will return if he is unable to tolerate fluids and symptoms are getting worse.  Final Clinical Impression(s) / ED Diagnoses Final diagnoses:  Nausea and vomiting, unspecified vomiting type  Acute hypokalemia  Dehydration    Rx / DC Orders ED Discharge Orders          Ordered    ondansetron (ZOFRAN-ODT) 8 MG disintegrating tablet  Every 8 hours PRN        04/08/23 1434    potassium chloride SA (KLOR-CON M) 20 MEQ tablet  2 times daily  04/08/23 1434              Derwood Kaplan, MD 04/08/23 1549

## 2023-04-08 NOTE — ED Notes (Signed)
PT bladder scanned at 41ml. Suspect a possible 39ml-10ml more ml per visual on Bladder UltraSound. PT stated no pain or tenderness during scan.

## 2023-04-08 NOTE — ED Triage Notes (Signed)
Vomiting /cold sweats/ stomach ache since Wednesday ,can't keep anything down.

## 2023-04-08 NOTE — ED Triage Notes (Signed)
Pt states she has not voided in 3 days.

## 2023-05-03 ENCOUNTER — Other Ambulatory Visit: Payer: Self-pay

## 2023-05-03 ENCOUNTER — Ambulatory Visit (INDEPENDENT_AMBULATORY_CARE_PROVIDER_SITE_OTHER): Payer: Medicaid Other | Admitting: Student

## 2023-05-03 ENCOUNTER — Encounter: Payer: Self-pay | Admitting: Student

## 2023-05-03 ENCOUNTER — Ambulatory Visit: Payer: Medicaid Other | Admitting: Student

## 2023-05-03 VITALS — BP 167/104 | HR 61 | Ht 62.0 in | Wt 217.4 lb

## 2023-05-03 DIAGNOSIS — R112 Nausea with vomiting, unspecified: Secondary | ICD-10-CM

## 2023-05-03 DIAGNOSIS — R7309 Other abnormal glucose: Secondary | ICD-10-CM

## 2023-05-03 DIAGNOSIS — K219 Gastro-esophageal reflux disease without esophagitis: Secondary | ICD-10-CM

## 2023-05-03 DIAGNOSIS — D649 Anemia, unspecified: Secondary | ICD-10-CM

## 2023-05-03 LAB — POCT GLYCOSYLATED HEMOGLOBIN (HGB A1C): Hemoglobin A1C: 5.3 % (ref 4.0–5.6)

## 2023-05-03 MED ORDER — FERROUS SULFATE 324 (65 FE) MG PO TBEC
1.0000 | DELAYED_RELEASE_TABLET | ORAL | 2 refills | Status: DC
Start: 2023-05-03 — End: 2024-06-18

## 2023-05-03 MED ORDER — FAMOTIDINE 40 MG PO TABS
40.0000 mg | ORAL_TABLET | Freq: Every day | ORAL | Status: DC
Start: 2023-05-03 — End: 2024-05-09

## 2023-05-03 NOTE — Progress Notes (Signed)
    SUBJECTIVE:   CHIEF COMPLAINT / HPI:   Vomiting Was seen at urgent care on 05/01/2023 due to vomiting, was given fluids, K was 3.2 and she was told to go to ED.  Was seen in [facility] ED where potassium was 3.5, had leukocytosis with elevated ANC.  She was treated with Zofran, Phenergan, IV fluids and potassium.  Has been seen in the ED for nausea and vomiting 6 times since February Abdominal CT 03/11/23 with no acute pathology  She has previously been advised to follow-up with GI but no one contacted her. She would like new referral placed.   Today is the first day she has been able to keep anything down -water and broth. Has not taken anti-emetic today but will take one now that she can tolerate oral intake, has disintegrating Zofran tablets. Last ate a meal about 4 days ago.  2 days ago notes that vomit was speckled red but not yesterday.  No fevers. No abdominal pain today.  Thinks this episode of nausea and vomiting may have been triggered by her period as it was heavy and painful.  Also notes she has bad acid reflux which could contribute and is out of famotidine.  She cannot think of any one contributing factor related to all of her previous episodes. Does have significant life stressors including separating from her son's father and not happy at current job.  Does use marijuana occasionally  Microcytic anemia Labs in ED showed hemoglobin 10.5 with MCV 67.3. Ferritin 11 on 12/04/22. Currently out of iron supplement.    PERTINENT  PMH / PSH: Asthma, GERD, anemia  OBJECTIVE:   BP (!) 167/104   Pulse 61   Ht 5\' 2"  (1.575 m)   Wt 217 lb 6.4 oz (98.6 kg)   SpO2 100%   BMI 39.76 kg/m    General: NAD, pleasant, able to participate in exam HEENT: White sclera, clear conjunctiva, MMM Cardiac: RRR, no murmurs. Respiratory: CTAB, normal effort, No wheezes, rales or rhonchi Abdomen: Bowel sounds present, nontender, nondistended, soft Skin: warm and dry, no rashes  noted Neuro: alert, no obvious focal deficits Psych: Normal affect and mood  ASSESSMENT/PLAN:   Nausea and vomiting Abdominal imaging last month reassuring.  Low concern for SBO, appendicitis, gastroenteritis.  Unlikely gastroparesis as patient does not have diabetes.  Based on history, especially in setting of marijuana use, I think cyclic vomiting syndrome and cannabinoid hyperemesis syndrome are high on the differential.  Per pt, has not eaten in about 4 days, has lost ~6 pounds over the last 2 days.  There is concern for refeeding syndrome.  Vitals are stable and she is overall well-appearing. -Referral to GI for further evaluation per patient's request -Patient advised to refrain from marijuana use -BMP, mag, Phos to monitor for refeeding, ED precautions given -Discussed introducing foods in very small amounts and very slowly, can sip on Pedialyte -Can take Zofran she has at home and Pepcid refilled -Work note provided  -Appointment made for Monday morning for follow-up  Anemia Iron supplement refilled.  Patient advised to wait till she is no longer nauseous before she starts taking it again. Can repeat iron studies few months after taking the supplement.   Elevated glucose Glucose was in 140s at ED visit, A1c previously in prediabetic range.  A1c WNL today at 5.3.  Dr. Erick Alley, DO [facility]

## 2023-05-03 NOTE — Patient Instructions (Addendum)
It was great to see you! Thank you for allowing me to participate in your care!  I recommend that you always bring your medications to each appointment as this makes it easy to ensure you are on the correct medications and helps [name] not miss when refills are needed.  Our plans for today:  - You will be called to schedule GI appointment  - Take the disintegrating Zofran for nausea and begin with eating very small amounts at a time - You can do small sips of Pedialyte throughout the next 2 days -Please schedule appointment on your way out for Monday for follow-up visit and to recheck blood pressure  -Over the weekend if you feel very weak, heart racing, light headed, vomiting worsens, go to the urgent care or ED  -Refill of famotidine for acid reflux and iron supplement for anemia have been sent to pharmacy - Return after your GI appointment   Take care and seek immediate care sooner if you develop any concerns.   Dr. Erick Alley, DO [facility]   Therapy and Counseling Resources Most providers on this list will take Medicaid. Patients with commercial insurance or Medicare should contact their insurance company to get a list of in network providers.  [facility] (spanish speaking therapist available)(habla espanol)(take medicare and medicaid)  [address], [place], [place] [address], [email or web address] [phone]  [facility]Psychiatry and Counseling [address] [place], [place] [address] [phone]  [facility]   [address], Suite [place], [place] [address]      [phone]  [facility] (spanish available) [address]  [place], [place] [address] [phone]  [facility] (take [facility] and medicare) [address]  [place], [place] [address]       [phone]     [facility] (virtual only) [phone]  Jovita Kussmaul [facility] [address] [place], [place] [phone]  [facility]:  [address] [place] [phone]  [facility]:  [address] [place] [phone]  [facility] (under & uninsured) [address]   [place] [phone]    [email or web address]    [facility] [address]  [place]    [phone]  [facility] -[address]     Phone:  [phone]     [facility]-  [address]  [phone]   [facility] [address]      [place], [place] [phone]  [facility]: [address] [place], [place], [place]  [phone]   [facility] (Spanish therapist) [email or web address]  [address]  [address]   [place] [address]    [phone]    [facility]   [address],  [place], [place]  [phone]   [facility]  [address] [place]  [phone]  [facility]  [address] [place], [place]        [phone]  Open Access/Walk In Clinic under & uninsured  [facility]  [address] [place], [place] [phone] Crisis [phone]  [facility] [address],  (Spanish)   [address], [place]: [phone]) 8:30 - 12; 1 - 2:30  [facility] [employer],  [address] [place], [place]    (  [phone]):8:30 - 12; 2 - 3PM  [facility] [address]  [address]  [place]; [phone]):   Mon - Fri 8 AM - 5 PM  [facility] [address] [place]  MWF 12:30 to 3:00 or call to schedule an appointment  [phone]  Specific Provider options Psychology Today  [email or web address]/us click on find a therapist  enter your zip code left side and select or tailor a therapist for your specific need.   [facility] Provider  Directory [email or web address]  (Medicaid)   Follow all drop down to find a provider  Social Support program [facility] [phone] or PhotoSolver.pl [address], [place], [place] Recovery support and educational   24- Hour Availability:   [facility]  [address] [place], [place] [phone] Crisis [phone]  [facility] [phone]  [facility]  [phone]   [facility]  [phone] (after hours)  Therapeutic Alternative/Mobile Crisis   [phone]  [place] National Suicide Hotline  [phone] Len Childs)  Call 911 or go to emergency room  [facility]  [phone]);  [place] and [employer]  [phone]); [place], [place], [place], [place], [place], [place], [place]

## 2023-05-04 NOTE — Assessment & Plan Note (Addendum)
Abdominal imaging last month reassuring.  Low concern for SBO, appendicitis, gastroenteritis.  Unlikely gastroparesis as patient does not have diabetes.  Based on history, especially in setting of marijuana use, I think cyclic vomiting syndrome and cannabinoid hyperemesis syndrome are high on the differential.  Per pt, has not eaten in about 4 days, has lost ~6 pounds over the last 2 days.  There is concern for refeeding syndrome.  Vitals are stable and Lynn Wilcox is overall well-appearing. -Referral to GI for further evaluation per patient's request -Patient advised to refrain from marijuana use -BMP, mag, Phos to monitor for refeeding, ED precautions given -Discussed introducing foods in very small amounts and very slowly, can sip on Pedialyte -Can take Zofran Lynn Wilcox has at home and Pepcid refilled -Work note provided  -Appointment made for Monday morning for follow-up

## 2023-05-04 NOTE — Assessment & Plan Note (Addendum)
Iron supplement refilled.  Patient advised to wait till she is no longer nauseous before she starts taking it again. Can repeat iron studies few months after taking the supplement.

## 2023-05-07 ENCOUNTER — Encounter: Payer: Self-pay | Admitting: Student

## 2023-05-07 ENCOUNTER — Ambulatory Visit: Payer: Medicaid Other | Admitting: Student

## 2023-05-07 VITALS — BP 129/80 | HR 70 | Ht 62.0 in | Wt 220.6 lb

## 2023-05-07 DIAGNOSIS — R112 Nausea with vomiting, unspecified: Secondary | ICD-10-CM

## 2023-05-07 DIAGNOSIS — D649 Anemia, unspecified: Secondary | ICD-10-CM | POA: Diagnosis not present

## 2023-05-07 NOTE — Assessment & Plan Note (Signed)
Improved, abdominal exam unremarkable.  Weight has improved since last visit.  Discussed lab work which is nonconcerning with trace hyponatremia and hypochloremia.  Recommended food diary.  She endorses that she will attempt to refrain from marijuana use.

## 2023-05-07 NOTE — Assessment & Plan Note (Signed)
Taking iron supplement, recheck in 2 and half to 3 months.  Recommended MiraLAX should she experience any constipation.

## 2023-05-07 NOTE — Patient Instructions (Signed)
It was great to see you today! Thank you for choosing [facility] for your primary care.   Today we addressed: I am glad you are feeling better.  I would recommend keeping a food diary of some sorts see can have extra information when you go to your GI appointment.  Please continue taking your iron supplement every other day and if you experience any kind of constipation you may take some MiraLAX.  Please come back to see me in 2 and half to 3 months and we can do your Pap smear and recheck your anemia status.  If you haven't already, sign up for My Chart to have easy access to your labs results, and communication with your primary care physician.  Return in about 3 months (around 08/07/2023) for follow up. Please arrive 15 minutes before your appointment to ensure smooth check in process.  We appreciate your efforts in making this happen.  Thank you for allowing me to participate in your care, Shelby Mattocks, DO 05/07/2023, 11:41 AM PGY-3, [facility]

## 2023-05-07 NOTE — Progress Notes (Signed)
  SUBJECTIVE:   CHIEF COMPLAINT / HPI:   Presents today for follow-up regarding nausea and vomiting in the setting of no abdominal pain.  She was seen on 7/25 for this. She notes her nausea/vomiting has improved now. She notes this is the 3rd time this has happened after her period. She notes this is not always when her period starts, but for the last several times it has.  She denies alcohol and tobacco use.  She does endorse infrequent marijuana use but notes that she is intending on stopping as she was talked to about cyclic vomiting syndrome and cannabinoid hyperemesis syndrome.  She also returns to discuss her blood pressure today.   PERTINENT  PMH / PSH: Asthma, GERD, anemia   Patient Care Team: Shelby Mattocks, DO as PCP - General (Family Medicine) OBJECTIVE:  BP 129/80   Pulse 70   Ht 5\' 2"  (1.575 m)   Wt 220 lb 9.6 oz (100.1 kg)   LMP 04/30/2023 (Approximate)   SpO2 100%   BMI 40.35 kg/m  General: Well-appearing, NAD Abdomen: Soft, nontender, normoactive bowel sounds  ASSESSMENT/PLAN:  Nausea and vomiting, unspecified vomiting type Assessment & Plan: Improved, abdominal exam unremarkable.  Weight has improved since last visit.  Discussed lab work which is nonconcerning with trace hyponatremia and hypochloremia.  Recommended food diary.  She endorses that she will attempt to refrain from marijuana use.   Anemia, unspecified type Assessment & Plan: Taking iron supplement, recheck in 2 and half to 3 months.  Recommended MiraLAX should she experience any constipation.   Healthcare maintenance: Return in 2 and half to 3 months for Pap smear  Return in about 3 months (around 08/07/2023) for follow up. Shelby Mattocks, DO 05/07/2023, 12:00 PM PGY-3, [facility]

## 2023-05-28 ENCOUNTER — Ambulatory Visit (INDEPENDENT_AMBULATORY_CARE_PROVIDER_SITE_OTHER): Payer: Medicaid Other | Admitting: Family Medicine

## 2023-05-28 ENCOUNTER — Encounter: Payer: Self-pay | Admitting: Family Medicine

## 2023-05-28 VITALS — BP 120/80 | HR 80 | Ht 62.0 in | Wt 215.8 lb

## 2023-05-28 DIAGNOSIS — K219 Gastro-esophageal reflux disease without esophagitis: Secondary | ICD-10-CM | POA: Diagnosis not present

## 2023-05-28 DIAGNOSIS — N920 Excessive and frequent menstruation with regular cycle: Secondary | ICD-10-CM | POA: Diagnosis not present

## 2023-05-28 DIAGNOSIS — R112 Nausea with vomiting, unspecified: Secondary | ICD-10-CM

## 2023-05-28 NOTE — Patient Instructions (Addendum)
Dear Lynn Wilcox  Today we discussed the following concerns and plans:  Nausea and vomiting for several days: Please continue to stay hydrated and eat what foods you can.  Use your Zofran as needed.  We will get labs today to check your electrolytes and check for anemia.  I will call you if any of these are abnormal and require medicine, etc.  I have provided you with a work letter today per your request.  Please follow-up with your OB/GYN as soon as possible to discuss changes to your periods.  Please let us know if you would like to be seen in our clinic for any GYN issues.  If you have any concerns, please call the clinic or schedule an appointment.  It was a pleasure to take care of you today. Be well!  Cyndia Skeeters, DO [facility], PGY-1

## 2023-05-28 NOTE — Assessment & Plan Note (Signed)
Patient unsure if this is contributing to episodes of nausea and vomiting.  She does not take Pepcid every day.  Does feel that Pepcid helps when she takes it regularly. -Advised patient to take Pepcid regularly rather than trying to "play catch up."  This will allow [name] to potentially eliminate GERD as a cause of her symptoms.

## 2023-05-28 NOTE — Assessment & Plan Note (Addendum)
No episodes of vomiting since this past weekend, though some nausea remains today. - BMP today to monitor electrolytes -Patient has Zofran at home that she can continue to use as needed -Encouraged good hydration status, eating foods that sound appealing to her.  Return precautions given for ED should patient be unable to keep down any food or drink, especially if she should stop producing urine. -Work note provided per patient request for days missed last week and today due to illness and appointment.

## 2023-05-28 NOTE — Progress Notes (Signed)
SUBJECTIVE:   CHIEF COMPLAINT / HPI:   Nausea, Vomiting x5 days  Last Thursday 8/15 pt started having significant nausea and vomiting, states could not keep food or drink down until yesterday 8/19 when she was able to drink some broth. She notes she also started menstruating around the time of symptoms onset, and relays that recent episodes of N/V have occurred at the same time as her period. No vomiting today, though she does note continued mild nausea.  This has been a recurrent problem and she has been seen in our clinic and in ED. Previously advised to stop marijuana use for concern of cannabis hyperemesis; she has not used marijuana in the last month.  She has not been keeping a food diary.  She notes that she eats many of the same foods frequently and does not feel that this is contributing to the symptoms given their timing.  She continues to take iron supplement and Pepcid, though irregularly.  Periods have been heavy for the last 3 years. Prior to that her periods were normal. Periods are monthly and last 7 days. She describes larger than fist sized clots and soaks a large pad in 2-3 hours. She is not She is due for follow up with OBGYN but does not have an appt.  PERTINENT  PMH / PSH:  Past Medical History:  Diagnosis Date   Anxiety and depression 1995   Arthritis 2014   Asthma 1993   Uses inhaler prn   Diverticulosis    Esophagitis    H/O gastroesophageal reflux (GERD) 2013   Hiatal hernia    Hypokalemia    Migraines    Obesity    Seasonal allergies      OBJECTIVE:   BP 120/80   Pulse 80   Ht 5\' 2"  (1.575 m)   Wt 215 lb 12.8 oz (97.9 kg)   LMP 05/24/2023 (Approximate)   SpO2 100%   BMI 39.47 kg/m   General: Very pleasant, Well-appearing, no acute distress Cardio: Regular rate, regular rhythm, no murmurs on exam. Pulm: Clear, no wheezing, no crackles. No increased work of breathing. Abdominal: bowel sounds present, soft, non-tender, non-distended. No CVA  tenderness. Extremities: no peripheral edema  Neuro: alert and oriented. Psych:  Cognition and judgment appear intact. Alert, communicative and cooperative.  ASSESSMENT/PLAN:   Nausea and vomiting No episodes of vomiting since this past weekend, though some nausea remains today. - BMP today to monitor electrolytes -Patient has Zofran at home that she can continue to use as needed -Encouraged good hydration status, eating foods that sound appealing to her.  Return precautions given for ED should patient be unable to keep down any food or drink, especially if she should stop producing urine. -Work note provided per patient request for days missed last week and today due to illness and appointment.  Menorrhagia with regular cycle History of heavy periods, severe cramping, large blood clots x3 years, prior to this periods were normal.  Not on birth control at this time. -CBC today to monitor hemoglobin given heavy menstrual flow -Encouraged patient to continue iron supplement as prescribed -Discussed with patient that she has numerous options regarding birth control and other management including hysterectomy should she be interested in pursuing this. -Advised her that we were happy to help with her GYN care if she wished, though also encouraged follow-up on these issues with her current OB/GYN.  She is scheduled for Pap with current OB/GYN next month.  GERD (gastroesophageal reflux disease) Patient  unsure if this is contributing to episodes of nausea and vomiting.  She does not take Pepcid every day.  Does feel that Pepcid helps when she takes it regularly. -Advised patient to take Pepcid regularly rather than trying to "play catch up."  This will allow [name] to potentially eliminate GERD as a cause of her symptoms.    Cyndia Skeeters, DO [facility]

## 2023-05-28 NOTE — Assessment & Plan Note (Addendum)
History of heavy periods, severe cramping, large blood clots x3 years, prior to this periods were normal.  Not on birth control at this time. -CBC today to monitor hemoglobin given heavy menstrual flow -Encouraged patient to continue iron supplement as prescribed -Discussed with patient that she has numerous options regarding birth control and other management including hysterectomy should she be interested in pursuing this. -Advised her that we were happy to help with her GYN care if she wished, though also encouraged follow-up on these issues with her current OB/GYN.  She is scheduled for Pap with current OB/GYN next month.

## 2023-05-29 LAB — BASIC METABOLIC PANEL
BUN/Creatinine Ratio: 12 (ref 9–23)
BUN: 10 mg/dL (ref 6–24)
CO2: 27 mmol/L (ref 20–29)
Calcium: 8.9 mg/dL (ref 8.7–10.2)
Chloride: 90 mmol/L — ABNORMAL LOW (ref 96–106)
Creatinine, Ser: 0.86 mg/dL (ref 0.57–1.00)
Glucose: 108 mg/dL — ABNORMAL HIGH (ref 70–99)
Potassium: 3.1 mmol/L — ABNORMAL LOW (ref 3.5–5.2)
Sodium: 133 mmol/L — ABNORMAL LOW (ref 134–144)
eGFR: 88 mL/min/{1.73_m2} (ref >59)

## 2023-05-29 LAB — CBC
Hematocrit: 32 % — ABNORMAL LOW (ref 34.0–46.6)
Hemoglobin: 9.3 g/dL — ABNORMAL LOW (ref 11.1–15.9)
MCH: 20.1 pg — ABNORMAL LOW (ref 26.6–33.0)
MCHC: 29.1 g/dL — ABNORMAL LOW (ref 31.5–35.7)
MCV: 69 fL — ABNORMAL LOW (ref 79–97)
Platelets: 308 10*3/uL (ref 150–450)
RBC: 4.62 x10E6/uL (ref 3.77–5.28)
RDW: 17.1 % — ABNORMAL HIGH (ref 11.7–15.4)
WBC: 8.4 10*3/uL (ref 3.4–10.8)

## 2023-05-30 ENCOUNTER — Telehealth: Payer: Self-pay | Admitting: Family Medicine

## 2023-05-30 NOTE — Progress Notes (Signed)
Called patient and confirmed DOB before discussing recent lab results.  Advised patient that her sodium and potassium were somewhat low. We expected this due to her recent vomiting episodes. We discussed that this will continue to improve as she is feeling better. Encouraged good hydration.  Also discussed patient's low Hgb. She has anemia (likely due to iron deficiency) in the setting of heavy periods. Encouraged patient to restart her previously prescribed oral iron supplement; she understands and agrees. Offered IV iron as an option, but patient declines at this time.

## 2023-06-07 NOTE — Telephone Encounter (Signed)
error 

## 2023-06-23 ENCOUNTER — Emergency Department (HOSPITAL_BASED_OUTPATIENT_CLINIC_OR_DEPARTMENT_OTHER)
Admission: EM | Admit: 2023-06-23 | Discharge: 2023-06-23 | Disposition: A | Discharge status: Home / Self Care | Payer: Medicaid Other | Attending: Emergency Medicine | Admitting: Emergency Medicine

## 2023-06-23 ENCOUNTER — Other Ambulatory Visit: Payer: Self-pay

## 2023-06-23 DIAGNOSIS — E876 Hypokalemia: Secondary | ICD-10-CM | POA: Insufficient documentation

## 2023-06-23 DIAGNOSIS — R112 Nausea with vomiting, unspecified: Secondary | ICD-10-CM | POA: Insufficient documentation

## 2023-06-23 LAB — COMPREHENSIVE METABOLIC PANEL
ALT: 15 U/L (ref 0–44)
AST: 20 U/L (ref 15–41)
Albumin: 4.3 g/dL (ref 3.5–5.0)
Alkaline Phosphatase: 46 U/L (ref 38–126)
Anion gap: 16 — ABNORMAL HIGH (ref 5–15)
BUN: 16 mg/dL (ref 6–20)
CO2: 28 mmol/L (ref 22–32)
Calcium: 9.4 mg/dL (ref 8.9–10.3)
Chloride: 87 mmol/L — ABNORMAL LOW (ref 98–111)
Creatinine, Ser: 0.75 mg/dL (ref 0.44–1.00)
GFR, Estimated: >60 mL/min (ref >60)
Glucose, Bld: 109 mg/dL — ABNORMAL HIGH (ref 70–99)
Potassium: 2.9 mmol/L — ABNORMAL LOW (ref 3.5–5.1)
Sodium: 131 mmol/L — ABNORMAL LOW (ref 135–145)
Total Bilirubin: 0.6 mg/dL (ref 0.3–1.2)
Total Protein: 8.6 g/dL — ABNORMAL HIGH (ref 6.5–8.1)

## 2023-06-23 LAB — CBC
HCT: 35.3 % — ABNORMAL LOW (ref 36.0–46.0)
Hemoglobin: 10.5 g/dL — ABNORMAL LOW (ref 12.0–15.0)
MCH: 19.4 pg — ABNORMAL LOW (ref 26.0–34.0)
MCHC: 29.7 g/dL — ABNORMAL LOW (ref 30.0–36.0)
MCV: 65.4 fL — ABNORMAL LOW (ref 80.0–100.0)
Platelets: 362 10*3/uL (ref 150–400)
RBC: 5.4 MIL/uL — ABNORMAL HIGH (ref 3.87–5.11)
RDW: 17.8 % — ABNORMAL HIGH (ref 11.5–15.5)
WBC: 11.4 10*3/uL — ABNORMAL HIGH (ref 4.0–10.5)
nRBC: 0 % (ref 0.0–0.2)

## 2023-06-23 LAB — HCG, QUANTITATIVE, PREGNANCY: hCG, Beta Chain, Quant, S: <1 m[IU]/mL (ref <5)

## 2023-06-23 LAB — LIPASE, BLOOD: Lipase: 20 U/L (ref 11–51)

## 2023-06-23 MED ORDER — FAMOTIDINE IN NACL 20-0.9 MG/50ML-% IV SOLN
20.0000 mg | Freq: Once | INTRAVENOUS | Status: AC
  Administered 2023-06-23: 20 mg via INTRAVENOUS
  Filled 2023-06-23: qty 50

## 2023-06-23 MED ORDER — SODIUM CHLORIDE 0.9 % IV BOLUS
1000.0000 mL | Freq: Once | INTRAVENOUS | Status: AC
  Administered 2023-06-23: 1000 mL via INTRAVENOUS

## 2023-06-23 MED ORDER — POTASSIUM CHLORIDE CRYS ER 20 MEQ PO TBCR
40.0000 meq | EXTENDED_RELEASE_TABLET | Freq: Once | ORAL | Status: AC
  Administered 2023-06-23: 40 meq via ORAL
  Filled 2023-06-23: qty 2

## 2023-06-23 MED ORDER — ALUM & MAG HYDROXIDE-SIMETH 200-200-20 MG/5ML PO SUSP
30.0000 mL | Freq: Once | ORAL | Status: AC
  Administered 2023-06-23: 30 mL via ORAL
  Filled 2023-06-23: qty 30

## 2023-06-23 MED ORDER — POTASSIUM CHLORIDE 10 MEQ/100ML IV SOLN
10.0000 meq | Freq: Once | INTRAVENOUS | Status: AC
  Administered 2023-06-23: 10 meq via INTRAVENOUS
  Filled 2023-06-23: qty 100

## 2023-06-23 MED ORDER — ONDANSETRON 4 MG PO TBDP: ORAL_TABLET | ORAL | 0 refills | Status: DC

## 2023-06-23 MED ORDER — ONDANSETRON HCL 4 MG/2ML IJ SOLN
4.0000 mg | Freq: Once | INTRAMUSCULAR | Status: AC
  Administered 2023-06-23: 4 mg via INTRAVENOUS
  Filled 2023-06-23: qty 2

## 2023-06-23 NOTE — ED Provider Notes (Signed)
EMERGENCY DEPARTMENT AT [facility] Provider Note   CSN: [id number] Arrival date & time: 06/23/23  1815     History  Chief Complaint  Patient presents with   Vomiting    Lynn Wilcox is a 42 y.o. female history of reflux here presenting with nausea vomiting.  Patient states that she had her menses about 3 days ago.  She has baseline lower abdominal cramps with her menses and some nausea with it.  She states that she had worsening nausea vomiting unable to keep anything down since yesterday.  Denies any diarrhea.  Denies any recent travel or eating uncooked meat.  Patient had several visits to the ED for similar symptoms and usually gets IV fluids and feels better.  The history is provided by the patient.       Home Medications Prior to Admission medications   Medication Sig Start Date End Date Taking? Authorizing Provider  cetirizine (ZYRTEC) 10 MG tablet TAKE 1 TABLET BY MOUTH DAILY 07/07/20   [name], DO  famotidine (PEPCID) 40 MG tablet Take 1 tablet (40 mg total) by mouth daily. 05/03/23   Erick Alley, DO  ferrous sulfate 324 (65 Fe) MG TBEC Take 1 tablet (325 mg total) by mouth every other day. 05/03/23   Erick Alley, DO  ibuprofen (ADVIL,MOTRIN) 600 MG tablet Take 1 tablet (600 mg total) by mouth every 6 (six) hours as needed for mild pain, moderate pain or cramping. 08/24/13   [name], CNM  ondansetron (ZOFRAN-ODT) 8 MG disintegrating tablet Take 1 tablet (8 mg total) by mouth every 8 (eight) hours as needed for nausea. 04/08/23   Derwood Kaplan, MD  potassium chloride SA (KLOR-CON M) 20 MEQ tablet Take 1 tablet (20 mEq total) by mouth 2 (two) times daily. Patient not taking: Reported on 05/03/2023 04/08/23   Derwood Kaplan, MD  PROAIR HFA 108 [phone] Base) MCG/ACT inhaler INHALE 2 PUFFS INTO THE LUNGS EVERY 6 HOURS AS NEEDED FOR WHEEZING 10/26/22   Lilland, Alana, DO  promethazine (PHENERGAN) 25 MG suppository Place 1 suppository (25 mg total)  rectally every 6 (six) hours as needed for nausea or vomiting. Patient not taking: Reported on 05/03/2023 03/15/23   Sabas Sous, MD  valACYclovir (VALTREX) 1000 MG tablet Take 1,000 mg by mouth 3 (three) times daily. Patient not taking: Reported on 05/03/2023 05/01/22   [provider]      Allergies    Chocolate, Coffee bean extract [coffea arabica], Soy allergy, Tea, and Yeast-derived drug products    Review of Systems   Review of Systems  Gastrointestinal:  Positive for vomiting.  All other systems reviewed and are negative.   Physical Exam Updated Vital Signs BP (!) 143/90   Pulse 73   Temp 98.2 F (36.8 C)   Resp 18   Ht 5\' 2"  (1.575 m)   Wt 97.9 kg   LMP 06/23/2023 (Approximate)   SpO2 100%   BMI 39.48 kg/m  Physical Exam Vitals and nursing note reviewed.  Constitutional:      Comments: Slightly dehydrated  HENT:     Head: Normocephalic.     Nose: Nose normal.     Mouth/Throat:     Mouth: Mucous membranes are dry.  Eyes:     Extraocular Movements: Extraocular movements intact.     Pupils: Pupils are equal, round, and reactive to light.  Cardiovascular:     Rate and Rhythm: Normal rate and regular rhythm.     Pulses: Normal pulses.  Heart sounds: Normal heart sounds.  Pulmonary:     Effort: Pulmonary effort is normal.     Breath sounds: Normal breath sounds.  Abdominal:     General: Abdomen is flat.     Palpations: Abdomen is soft.  Musculoskeletal:        General: Normal range of motion.     Cervical back: Normal range of motion and neck supple.  Skin:    General: Skin is warm.     Capillary Refill: Capillary refill takes less than 2 seconds.  Neurological:     General: No focal deficit present.     Mental Status: She is oriented to person, place, and time. Mental status is at baseline.  Psychiatric:        Mood and Affect: Mood normal.        Behavior: Behavior normal.     ED Results / Procedures / Treatments   Labs (all labs  ordered are listed, but only abnormal results are displayed) Labs Reviewed  COMPREHENSIVE METABOLIC PANEL - Abnormal; Notable for the following components:      Result Value   Sodium 131 (*)    Potassium 2.9 (*)    Chloride 87 (*)    Glucose, Bld 109 (*)    Total Protein 8.6 (*)    Anion gap 16 (*)    All other components within normal limits  CBC - Abnormal; Notable for the following components:   WBC 11.4 (*)    RBC 5.40 (*)    Hemoglobin 10.5 (*)    HCT 35.3 (*)    MCV 65.4 (*)    MCH 19.4 (*)    MCHC 29.7 (*)    RDW 17.8 (*)    All other components within normal limits  LIPASE, BLOOD  HCG, QUANTITATIVE, PREGNANCY    EKG None  Radiology No results found.  Procedures Procedures    Medications Ordered in ED Medications  famotidine (PEPCID) IVPB 20 mg premix (has no administration in time range)  alum & mag hydroxide-simeth (MAALOX/MYLANTA) 200-200-20 MG/5ML suspension 30 mL (has no administration in time range)  potassium chloride 10 mEq in 100 mL IVPB (has no administration in time range)  potassium chloride SA (KLOR-CON M) CR tablet 40 mEq (has no administration in time range)  sodium chloride 0.9 % bolus 1,000 mL (1,000 mLs Intravenous New Bag/Given 06/23/23 1917)  ondansetron (ZOFRAN) injection 4 mg (4 mg Intravenous Given 06/23/23 1922)    ED Course/ Medical Decision Making/ A&P                                 Medical Decision Making Lynn Wilcox is a 42 y.o. female here presenting with vomiting.  This is a chronic issue associated with her menses.  Patient has no abdominal tenderness.  I have low suspicion for small bowel obstruction.  I think likely viral gastroenteritis.  Plan to get CBC and CMP and hydrate patient and give nausea medicine and reassess.  9:59 PM  I reviewed patient's labs and patient's potassium is 2.9 and gap is 16.  Patient received IV potassium and fluids and tolerated p.o.  Patient is stable for discharge.  Likely viral  gastroenteritis.  Problems Addressed: Nausea and vomiting, unspecified vomiting type: acute illness or injury  Amount and/or Complexity of Data Reviewed Labs: ordered. Decision-making details documented in ED Course.  Risk OTC drugs. Prescription drug management.    Final Clinical Impression(s) /  ED Diagnoses Final diagnoses:  None    Rx / DC Orders ED Discharge Orders     None         Charlynne Pander, MD 06/23/23 2202

## 2023-06-23 NOTE — ED Triage Notes (Signed)
Patient arrives with complaints of vomiting and inability to keep food down for 4 days. Patient reports some stomach burning at this time. No relief from vomiting with home medications.

## 2023-06-23 NOTE — Discharge Instructions (Addendum)
Your potassium is slightly low from vomiting.  You need to take Zofran for nausea and stay hydrated.  You can eat food that is high in potassium such as bananas and beans.  See your doctor for follow-up  Return to ER if you have worse abdominal pain or vomiting or dehydration

## 2023-08-08 ENCOUNTER — Ambulatory Visit: Payer: Medicaid Other | Admitting: Student

## 2023-08-11 ENCOUNTER — Encounter (HOSPITAL_BASED_OUTPATIENT_CLINIC_OR_DEPARTMENT_OTHER): Payer: Self-pay

## 2023-08-11 ENCOUNTER — Emergency Department (HOSPITAL_BASED_OUTPATIENT_CLINIC_OR_DEPARTMENT_OTHER)
Admission: EM | Admit: 2023-08-11 | Discharge: 2023-08-11 | Disposition: A | Discharge status: Home / Self Care | Payer: Medicaid Other | Attending: Emergency Medicine | Admitting: Emergency Medicine

## 2023-08-11 DIAGNOSIS — R112 Nausea with vomiting, unspecified: Secondary | ICD-10-CM | POA: Diagnosis not present

## 2023-08-11 DIAGNOSIS — R109 Unspecified abdominal pain: Secondary | ICD-10-CM | POA: Diagnosis present

## 2023-08-11 DIAGNOSIS — R1084 Generalized abdominal pain: Secondary | ICD-10-CM | POA: Insufficient documentation

## 2023-08-11 LAB — COMPREHENSIVE METABOLIC PANEL
ALT: 14 U/L (ref 0–44)
AST: 20 U/L (ref 15–41)
Albumin: 4.7 g/dL (ref 3.5–5.0)
Alkaline Phosphatase: 55 U/L (ref 38–126)
Anion gap: 16 — ABNORMAL HIGH (ref 5–15)
BUN: 15 mg/dL (ref 6–20)
CO2: 30 mmol/L (ref 22–32)
Calcium: 10.8 mg/dL — ABNORMAL HIGH (ref 8.9–10.3)
Chloride: 95 mmol/L — ABNORMAL LOW (ref 98–111)
Creatinine, Ser: 0.92 mg/dL (ref 0.44–1.00)
GFR, Estimated: >60 mL/min (ref >60)
Glucose, Bld: 154 mg/dL — ABNORMAL HIGH (ref 70–99)
Potassium: 3 mmol/L — ABNORMAL LOW (ref 3.5–5.1)
Sodium: 141 mmol/L (ref 135–145)
Total Bilirubin: 0.8 mg/dL (ref 0.3–1.2)
Total Protein: 9.2 g/dL — ABNORMAL HIGH (ref 6.5–8.1)

## 2023-08-11 LAB — CBC WITH DIFFERENTIAL/PLATELET
Abs Immature Granulocytes: 0.03 10*3/uL (ref 0.00–0.07)
Basophils Absolute: 0 10*3/uL (ref 0.0–0.1)
Basophils Relative: 0 %
Eosinophils Absolute: 0 10*3/uL (ref 0.0–0.5)
Eosinophils Relative: 0 %
HCT: 34.3 % — ABNORMAL LOW (ref 36.0–46.0)
Hemoglobin: 10.1 g/dL — ABNORMAL LOW (ref 12.0–15.0)
Immature Granulocytes: 0 %
Lymphocytes Relative: 13 %
Lymphs Abs: 1.4 10*3/uL (ref 0.7–4.0)
MCH: 18.4 pg — ABNORMAL LOW (ref 26.0–34.0)
MCHC: 29.4 g/dL — ABNORMAL LOW (ref 30.0–36.0)
MCV: 62.4 fL — ABNORMAL LOW (ref 80.0–100.0)
Monocytes Absolute: 0.9 10*3/uL (ref 0.1–1.0)
Monocytes Relative: 9 %
Neutro Abs: 8.4 10*3/uL — ABNORMAL HIGH (ref 1.7–7.7)
Neutrophils Relative %: 78 %
Platelets: 381 10*3/uL (ref 150–400)
RBC: 5.5 MIL/uL — ABNORMAL HIGH (ref 3.87–5.11)
RDW: 19.5 % — ABNORMAL HIGH (ref 11.5–15.5)
WBC: 10.7 10*3/uL — ABNORMAL HIGH (ref 4.0–10.5)
nRBC: 0 % (ref 0.0–0.2)

## 2023-08-11 LAB — HCG, QUANTITATIVE, PREGNANCY: hCG, Beta Chain, Quant, S: <1 m[IU]/mL (ref <5)

## 2023-08-11 LAB — LIPASE, BLOOD: Lipase: 22 U/L (ref 11–51)

## 2023-08-11 MED ORDER — DIPHENHYDRAMINE HCL 50 MG/ML IJ SOLN
25.0000 mg | Freq: Once | INTRAMUSCULAR | Status: AC
  Administered 2023-08-11: 25 mg via INTRAVENOUS
  Filled 2023-08-11: qty 1

## 2023-08-11 MED ORDER — SODIUM CHLORIDE 0.9 % IV BOLUS
1000.0000 mL | Freq: Once | INTRAVENOUS | Status: AC
  Administered 2023-08-11: 1000 mL via INTRAVENOUS

## 2023-08-11 MED ORDER — DROPERIDOL 2.5 MG/ML IJ SOLN
1.2500 mg | Freq: Once | INTRAMUSCULAR | Status: AC
  Administered 2023-08-11: 1.25 mg via INTRAVENOUS
  Filled 2023-08-11: qty 2

## 2023-08-11 MED ORDER — ONDANSETRON 4 MG PO TBDP: ORAL_TABLET | ORAL | 0 refills | Status: DC

## 2023-08-11 MED ORDER — ALUM & MAG HYDROXIDE-SIMETH 200-200-20 MG/5ML PO SUSP
30.0000 mL | Freq: Once | ORAL | Status: AC
  Administered 2023-08-11: 30 mL via ORAL
  Filled 2023-08-11: qty 30

## 2023-08-11 MED ORDER — PROMETHAZINE HCL 25 MG RE SUPP: 25.0000 mg | Freq: Four times a day (QID) | RECTAL | 0 refills | Status: DC | PRN

## 2023-08-11 NOTE — ED Triage Notes (Signed)
PT to triage c/o epigastric pain 10/10 burning in nature with Emesis x 3 days. Pt state she has Hx of GERD and hasn't been able to keep any food or drink down. VSS NAD Pt on room air.

## 2023-08-11 NOTE — ED Provider Notes (Signed)
[place] EMERGENCY DEPARTMENT AT [facility] Provider Note   CSN: [id number] Arrival date & time: 08/11/23  0148     History  Chief Complaint  Patient presents with   Abdominal Pain    Lynn Wilcox is a 42 y.o. female.  42 yo F with a chief complaints of nausea and vomiting.  This been going on for about 3 days.  She tells me she gets symptoms like this sometimes when she is on her menstrual cycle.  Coincided with the onset of her menses 3 days ago.  Denies diarrhea.  Denies recent travel denies suspicious food intake.  No fevers.   Abdominal Pain      Home Medications Prior to Admission medications   Medication Sig Start Date End Date Taking? Authorizing Provider  ondansetron (ZOFRAN-ODT) 4 MG disintegrating tablet 4mg  ODT q4 hours prn nausea/vomit 08/11/23  Yes Melene Plan, DO  promethazine (PHENERGAN) 25 MG suppository Place 1 suppository (25 mg total) rectally every 6 (six) hours as needed for nausea or vomiting. 08/11/23  Yes Melene Plan, DO  cetirizine (ZYRTEC) 10 MG tablet TAKE 1 TABLET BY MOUTH DAILY 07/07/20   [name], DO  famotidine (PEPCID) 40 MG tablet Take 1 tablet (40 mg total) by mouth daily. 05/03/23   Erick Alley, DO  ferrous sulfate 324 (65 Fe) MG TBEC Take 1 tablet (325 mg total) by mouth every other day. 05/03/23   Erick Alley, DO  ibuprofen (ADVIL,MOTRIN) 600 MG tablet Take 1 tablet (600 mg total) by mouth every 6 (six) hours as needed for mild pain, moderate pain or cramping. 08/24/13   [name], CNM  potassium chloride SA (KLOR-CON M) 20 MEQ tablet Take 1 tablet (20 mEq total) by mouth 2 (two) times daily. Patient not taking: Reported on 05/03/2023 04/08/23   Derwood Kaplan, MD  PROAIR HFA 108 [phone] Base) MCG/ACT inhaler INHALE 2 PUFFS INTO THE LUNGS EVERY 6 HOURS AS NEEDED FOR WHEEZING 10/26/22   Lilland, Alana, DO  valACYclovir (VALTREX) 1000 MG tablet Take 1,000 mg by mouth 3 (three) times daily. Patient not taking: Reported on  05/03/2023 05/01/22   [provider]      Allergies    Chocolate, Coffee bean extract [coffea arabica], Soy allergy, Tea, and Yeast-derived drug products    Review of Systems   Review of Systems  Gastrointestinal:  Positive for abdominal pain.    Physical Exam Updated Vital Signs BP (!) 149/104 (BP Location: Right Arm)   Pulse 99   Temp 98.3 F (36.8 C) (Oral)   Resp 19   Wt 105.2 kg   LMP 08/10/2023 (Exact Date)   SpO2 100%   BMI 42.43 kg/m  Physical Exam Vitals and nursing note reviewed.  Constitutional:      General: She is not in acute distress.    Appearance: She is well-developed. She is not diaphoretic.  HENT:     Head: Normocephalic and atraumatic.  Eyes:     Pupils: Pupils are equal, round, and reactive to light.  Cardiovascular:     Rate and Rhythm: Normal rate and regular rhythm.     Heart sounds: No murmur heard.    No friction rub. No gallop.  Pulmonary:     Effort: Pulmonary effort is normal.     Breath sounds: No wheezing or rales.  Abdominal:     General: There is no distension.     Palpations: Abdomen is soft.     Tenderness: There is no abdominal tenderness.  Comments: Benign abdominal exam  Musculoskeletal:        General: No tenderness.     Cervical back: Normal range of motion and neck supple.  Skin:    General: Skin is warm and dry.  Neurological:     Mental Status: She is alert and oriented to person, place, and time.  Psychiatric:        Behavior: Behavior normal.     ED Results / Procedures / Treatments   Labs (all labs ordered are listed, but only abnormal results are displayed) Labs Reviewed  CBC WITH DIFFERENTIAL/PLATELET - Abnormal; Notable for the following components:      Result Value   WBC 10.7 (*)    RBC 5.50 (*)    Hemoglobin 10.1 (*)    HCT 34.3 (*)    MCV 62.4 (*)    MCH 18.4 (*)    MCHC 29.4 (*)    RDW 19.5 (*)    Neutro Abs 8.4 (*)    All other components within normal limits  COMPREHENSIVE  METABOLIC PANEL - Abnormal; Notable for the following components:   Potassium 3.0 (*)    Chloride 95 (*)    Glucose, Bld 154 (*)    Calcium 10.8 (*)    Total Protein 9.2 (*)    Anion gap 16 (*)    All other components within normal limits  LIPASE, BLOOD  HCG, QUANTITATIVE, PREGNANCY    EKG None  Radiology No results found.  Procedures Procedures    Medications Ordered in ED Medications  sodium chloride 0.9 % bolus 1,000 mL (1,000 mLs Intravenous New Bag/Given 08/11/23 0226)  droperidol (INAPSINE) 2.5 MG/ML injection 1.25 mg (1.25 mg Intravenous Given 08/11/23 0226)  diphenhydrAMINE (BENADRYL) injection 25 mg (25 mg Intravenous Given 08/11/23 0226)  alum & mag hydroxide-simeth (MAALOX/MYLANTA) 200-200-20 MG/5ML suspension 30 mL (30 mLs Oral Given 08/11/23 0227)    ED Course/ Medical Decision Making/ A&P                                 Medical Decision Making Amount and/or Complexity of Data Reviewed Labs: ordered.  Risk OTC drugs. Prescription drug management.   42 yo F with a chief complaints of intractable nausea and vomiting.  Going on for 3 days now.  On my record review the patient has had multiple visits for the same.  She says occurs with her menses.  Encouraged her to discuss this with her primary care provider or OB/GYN.  Laboratory evaluation treat pain and nausea and reassess.  Patient's labs consistent with prior.  Mildly low potassium.  Elevated anion gap.  No significant change to renal function.  LFTs and lipase are unremarkable.  Pregnancy test negative.  She is feeling much better on repeat assessment and would like to go home.  3:44 AM:  I have discussed the diagnosis/risks/treatment options with the patient and family.  Evaluation and diagnostic testing in the emergency department does not suggest an emergent condition requiring admission or immediate intervention beyond what has been performed at this time.  They will follow up with PCP. We also  discussed returning to the ED immediately if new or worsening sx occur. We discussed the sx which are most concerning (e.g., sudden worsening pain, fever, inability to tolerate by mouth) that necessitate immediate return. Medications administered to the patient during their visit and any new prescriptions provided to the patient are listed below.  Medications given  during this visit Medications  sodium chloride 0.9 % bolus 1,000 mL (1,000 mLs Intravenous New Bag/Given 08/11/23 0226)  droperidol (INAPSINE) 2.5 MG/ML injection 1.25 mg (1.25 mg Intravenous Given 08/11/23 0226)  diphenhydrAMINE (BENADRYL) injection 25 mg (25 mg Intravenous Given 08/11/23 0226)  alum & mag hydroxide-simeth (MAALOX/MYLANTA) 200-200-20 MG/5ML suspension 30 mL (30 mLs Oral Given 08/11/23 0227)     The patient appears reasonably screen and/or stabilized for discharge and I doubt any other medical condition or other [facility] requiring further screening, evaluation, or treatment in the ED at this time prior to discharge.          Final Clinical Impression(s) / ED Diagnoses Final diagnoses:  Generalized abdominal pain    Rx / DC Orders ED Discharge Orders          Ordered    ondansetron (ZOFRAN-ODT) 4 MG disintegrating tablet        08/11/23 0337    promethazine (PHENERGAN) 25 MG suppository  Every 6 hours PRN        08/11/23 0337              Melene Plan, DO 08/11/23 [phone]

## 2023-08-11 NOTE — Discharge Instructions (Signed)
Please call your family doctor or OB/GYN and let them know about your recurrent issue.  See if they want to start you on a medication to help prevent this from happening.

## 2023-08-13 ENCOUNTER — Emergency Department (HOSPITAL_BASED_OUTPATIENT_CLINIC_OR_DEPARTMENT_OTHER)
Admission: EM | Admit: 2023-08-13 | Discharge: 2023-08-13 | Disposition: A | Discharge status: Home / Self Care | Payer: Medicaid Other | Attending: Emergency Medicine | Admitting: Emergency Medicine

## 2023-08-13 ENCOUNTER — Other Ambulatory Visit: Payer: Self-pay

## 2023-08-13 DIAGNOSIS — R9431 Abnormal electrocardiogram [ECG] [EKG]: Secondary | ICD-10-CM | POA: Insufficient documentation

## 2023-08-13 DIAGNOSIS — E876 Hypokalemia: Secondary | ICD-10-CM | POA: Diagnosis not present

## 2023-08-13 DIAGNOSIS — N946 Dysmenorrhea, unspecified: Secondary | ICD-10-CM | POA: Diagnosis not present

## 2023-08-13 DIAGNOSIS — R1115 Cyclical vomiting syndrome unrelated to migraine: Secondary | ICD-10-CM | POA: Diagnosis not present

## 2023-08-13 DIAGNOSIS — R112 Nausea with vomiting, unspecified: Secondary | ICD-10-CM | POA: Diagnosis present

## 2023-08-13 DIAGNOSIS — D649 Anemia, unspecified: Secondary | ICD-10-CM | POA: Diagnosis not present

## 2023-08-13 LAB — COMPREHENSIVE METABOLIC PANEL
ALT: 15 U/L (ref 0–44)
AST: 18 U/L (ref 15–41)
Albumin: 4.4 g/dL (ref 3.5–5.0)
Alkaline Phosphatase: 50 U/L (ref 38–126)
Anion gap: 13 (ref 5–15)
BUN: 14 mg/dL (ref 6–20)
CO2: 30 mmol/L (ref 22–32)
Calcium: 10.1 mg/dL (ref 8.9–10.3)
Chloride: 92 mmol/L — ABNORMAL LOW (ref 98–111)
Creatinine, Ser: 0.81 mg/dL (ref 0.44–1.00)
GFR, Estimated: >60 mL/min (ref >60)
Glucose, Bld: 122 mg/dL — ABNORMAL HIGH (ref 70–99)
Potassium: 2.9 mmol/L — ABNORMAL LOW (ref 3.5–5.1)
Sodium: 135 mmol/L (ref 135–145)
Total Bilirubin: 0.6 mg/dL (ref <1.2)
Total Protein: 8.5 g/dL — ABNORMAL HIGH (ref 6.5–8.1)

## 2023-08-13 LAB — CBC WITH DIFFERENTIAL/PLATELET
Abs Immature Granulocytes: 0.03 10*3/uL (ref 0.00–0.07)
Basophils Absolute: 0 10*3/uL (ref 0.0–0.1)
Basophils Relative: 0 %
Eosinophils Absolute: 0 10*3/uL (ref 0.0–0.5)
Eosinophils Relative: 0 %
HCT: 33.9 % — ABNORMAL LOW (ref 36.0–46.0)
Hemoglobin: 9.9 g/dL — ABNORMAL LOW (ref 12.0–15.0)
Immature Granulocytes: 0 %
Lymphocytes Relative: 15 %
Lymphs Abs: 1.5 10*3/uL (ref 0.7–4.0)
MCH: 18.4 pg — ABNORMAL LOW (ref 26.0–34.0)
MCHC: 29.2 g/dL — ABNORMAL LOW (ref 30.0–36.0)
MCV: 63 fL — ABNORMAL LOW (ref 80.0–100.0)
Monocytes Absolute: 0.8 10*3/uL (ref 0.1–1.0)
Monocytes Relative: 8 %
Neutro Abs: 7.5 10*3/uL (ref 1.7–7.7)
Neutrophils Relative %: 77 %
Platelets: 362 10*3/uL (ref 150–400)
RBC: 5.38 MIL/uL — ABNORMAL HIGH (ref 3.87–5.11)
RDW: 19.1 % — ABNORMAL HIGH (ref 11.5–15.5)
WBC: 9.9 10*3/uL (ref 4.0–10.5)
nRBC: 0 % (ref 0.0–0.2)

## 2023-08-13 LAB — MAGNESIUM: Magnesium: 2.1 mg/dL (ref 1.7–2.4)

## 2023-08-13 MED ORDER — PROCHLORPERAZINE MALEATE 10 MG PO TABS: 10.0000 mg | ORAL_TABLET | Freq: Two times a day (BID) | ORAL | 0 refills | Status: DC | PRN

## 2023-08-13 MED ORDER — POTASSIUM CHLORIDE CRYS ER 20 MEQ PO TBCR: 20.0000 meq | EXTENDED_RELEASE_TABLET | Freq: Two times a day (BID) | ORAL | 0 refills | Status: DC

## 2023-08-13 MED ORDER — SODIUM CHLORIDE 0.9 % IV BOLUS
1000.0000 mL | Freq: Once | INTRAVENOUS | Status: AC
  Administered 2023-08-13: 1000 mL via INTRAVENOUS

## 2023-08-13 MED ORDER — PROCHLORPERAZINE EDISYLATE 10 MG/2ML IJ SOLN
10.0000 mg | Freq: Once | INTRAMUSCULAR | Status: AC
  Administered 2023-08-13: 10 mg via INTRAVENOUS
  Filled 2023-08-13: qty 2

## 2023-08-13 MED ORDER — DROPERIDOL 2.5 MG/ML IJ SOLN
2.5000 mg | Freq: Once | INTRAMUSCULAR | Status: DC
  Filled 2023-08-13: qty 2

## 2023-08-13 MED ORDER — POTASSIUM CHLORIDE CRYS ER 20 MEQ PO TBCR
40.0000 meq | EXTENDED_RELEASE_TABLET | Freq: Once | ORAL | Status: AC
  Administered 2023-08-13: 40 meq via ORAL
  Filled 2023-08-13: qty 2

## 2023-08-13 NOTE — ED Triage Notes (Signed)
Pt c/o vomiting since Thursday. Was seen on Saturday for same. States symptoms have not improved. C/o upper abd hurting. Fevers unknown. Denies any urinary symptoms. States decrease urine output. Denies any diarrhea. Took Zofran at [place] with no relief.

## 2023-08-13 NOTE — ED Provider Notes (Signed)
[place] EMERGENCY DEPARTMENT AT [facility]  Provider Note  CSN: [id number] Arrival date & time: 08/13/23 0401  History Chief Complaint  Patient presents with   Abdominal Pain    Lynn Wilcox is a 42 y.o. female here for re-evaluation of N/V and diffuse abdominal pain. Has similar associated with menses frequently. Was in the ED 2 days ago for same and felt better at discharge. No fevers. She denies EtOH use. Reports continued occasional THC use on weekends. Symptoms not controlled with Zofran at home.    Home Medications Prior to Admission medications   Medication Sig Start Date End Date Taking? Authorizing Provider  prochlorperazine (COMPAZINE) 10 MG tablet Take 1 tablet (10 mg total) by mouth 2 (two) times daily as needed for nausea or vomiting. 08/13/23  Yes Pollyann Savoy, MD  cetirizine (ZYRTEC) 10 MG tablet TAKE 1 TABLET BY MOUTH DAILY 07/07/20   [name], DO  famotidine (PEPCID) 40 MG tablet Take 1 tablet (40 mg total) by mouth daily. 05/03/23   Erick Alley, DO  ferrous sulfate 324 (65 Fe) MG TBEC Take 1 tablet (325 mg total) by mouth every other day. 05/03/23   Erick Alley, DO  ibuprofen (ADVIL,MOTRIN) 600 MG tablet Take 1 tablet (600 mg total) by mouth every 6 (six) hours as needed for mild pain, moderate pain or cramping. 08/24/13   [name], CNM  ondansetron (ZOFRAN-ODT) 4 MG disintegrating tablet 4mg  ODT q4 hours prn nausea/vomit 08/11/23   Melene Plan, DO  potassium chloride SA (KLOR-CON M) 20 MEQ tablet Take 1 tablet (20 mEq total) by mouth 2 (two) times daily. 08/13/23   Pollyann Savoy, MD  PROAIR HFA 108 [phone] Base) MCG/ACT inhaler INHALE 2 PUFFS INTO THE LUNGS EVERY 6 HOURS AS NEEDED FOR WHEEZING 10/26/22   Lilland, Alana, DO  promethazine (PHENERGAN) 25 MG suppository Place 1 suppository (25 mg total) rectally every 6 (six) hours as needed for nausea or vomiting. 08/11/23   Melene Plan, DO  valACYclovir (VALTREX) 1000 MG tablet Take 1,000 mg by  mouth 3 (three) times daily. Patient not taking: Reported on 05/03/2023 05/01/22   [provider]     Allergies    Chocolate, Coffee bean extract [coffea arabica], Soy allergy, Tea, and Yeast-derived drug products   Review of Systems   Review of Systems Please see HPI for pertinent positives and negatives  Physical Exam BP 132/76   Pulse 73   Temp 97.6 F (36.4 C) (Oral)   Resp 20   Ht 5\' 2"  (1.575 m)   Wt 105.2 kg   LMP 08/10/2023 (Exact Date)   SpO2 98%   BMI 42.43 kg/m   Physical Exam Vitals and nursing note reviewed.  Constitutional:      Appearance: Normal appearance.  HENT:     Head: Normocephalic and atraumatic.     Nose: Nose normal.     Mouth/Throat:     Mouth: Mucous membranes are moist.  Eyes:     Extraocular Movements: Extraocular movements intact.     Conjunctiva/sclera: Conjunctivae normal.  Cardiovascular:     Rate and Rhythm: Normal rate.  Pulmonary:     Effort: Pulmonary effort is normal.     Breath sounds: Normal breath sounds.  Abdominal:     General: Abdomen is flat.     Palpations: Abdomen is soft.     Tenderness: There is no abdominal tenderness. There is no guarding. Negative signs include Murphy's sign and McBurney's sign.  Musculoskeletal:  General: No swelling. Normal range of motion.     Cervical back: Neck supple.  Skin:    General: Skin is warm and dry.  Neurological:     General: No focal deficit present.     Mental Status: She is alert.  Psychiatric:        Mood and Affect: Mood normal.     ED Results / Procedures / Treatments   EKG EKG Interpretation Date/Time:  Monday August 13 2023 04:30:58 EST Ventricular Rate:  80 PR Interval:  136 QRS Duration:  84 QT Interval:  518 QTC Calculation: 598 R Axis:   53  Text Interpretation: Sinus rhythm Borderline ST elevation, lateral leads Prolonged QT interval Since last tracing QT has lengthened Confirmed by Susy Frizzle [phone]) on 08/13/2023 4:34:51  AM  Procedures Procedures  Medications Ordered in the ED Medications  potassium chloride SA (KLOR-CON M) CR tablet 40 mEq (has no administration in time range)  sodium chloride 0.9 % bolus 1,000 mL (0 mLs Intravenous Stopped 08/13/23 0533)  prochlorperazine (COMPAZINE) injection 10 mg (10 mg Intravenous Given 08/13/23 0441)    Initial Impression and Plan  Patient here with persistent N/V and abdominal pain. Exam is benign. Vitals are reassuring. Will recheck labs and give droperidol and IVF for comfort. She was advised to stop using THC altogether in case that is contributing.   ED Course   Clinical Course as of 08/13/23 0534  [facility] Aug 13, 2023  0435 EKG shows prolonged QT, will change droperidol to compazine.  [CS]  0435 CBC with normal WBC, stable anemia.  [CS]  0511 CMP shows a low K, similar to previous visits. Anion gap is normal. Magnesium is normal. Will replete orally when nausea is improved.  [CS]  0532 Patient is feeling better, no further vomiting since arrival. Reviewed lab results with the patient. She seems to have responded well to Compazine, will plan discharge with Rx for same. PCP follow up, RTED for any other concerns.   [CS]    Clinical Course User Index [CS] Pollyann Savoy, MD     MDM Rules/Calculators/A&P Medical Decision Making Given presenting complaint, I considered that admission might be necessary. After review of results from ED lab and/or imaging studies, admission to the hospital is not indicated at this time.    Problems Addressed: Cyclic vomiting syndrome: chronic illness or injury with exacerbation, progression, or side effects of treatment Dysmenorrhea: chronic illness or injury with exacerbation, progression, or side effects of treatment Hypokalemia: acute illness or injury Prolonged Q-T interval on ECG: undiagnosed new problem with uncertain prognosis  Amount and/or Complexity of Data Reviewed Labs: ordered. Decision-making details  documented in ED Course. ECG/medicine tests: ordered and independent interpretation performed. Decision-making details documented in ED Course.  Risk Prescription drug management. Decision regarding hospitalization.     Final Clinical Impression(s) / ED Diagnoses Final diagnoses:  Cyclic vomiting syndrome  Dysmenorrhea  Hypokalemia  Prolonged Q-T interval on ECG    Rx / DC Orders ED Discharge Orders          Ordered    prochlorperazine (COMPAZINE) 10 MG tablet  2 times daily PRN        08/13/23 0533    potassium chloride SA (KLOR-CON M) 20 MEQ tablet  2 times daily        08/13/23 0533             Pollyann Savoy, MD 08/13/23 [phone]

## 2023-08-23 NOTE — Progress Notes (Deleted)
  SUBJECTIVE:   CHIEF COMPLAINT / HPI:   ***  PERTINENT  PMH / PSH: ***  OBJECTIVE:  LMP 08/10/2023 (Exact Date)  Physical Exam   ASSESSMENT/PLAN:   Assessment & Plan  No follow-ups on file. Shelby Mattocks, DO 08/23/2023, 3:06 PM PGY-3, [facility] {    This will disappear when note is signed, click to select method of visit    :1}

## 2023-08-24 ENCOUNTER — Ambulatory Visit: Payer: Medicaid Other | Admitting: Student

## 2023-10-02 ENCOUNTER — Encounter (HOSPITAL_BASED_OUTPATIENT_CLINIC_OR_DEPARTMENT_OTHER): Payer: Self-pay

## 2023-10-02 ENCOUNTER — Other Ambulatory Visit: Payer: Self-pay

## 2023-10-02 ENCOUNTER — Emergency Department (HOSPITAL_BASED_OUTPATIENT_CLINIC_OR_DEPARTMENT_OTHER)
Admission: EM | Admit: 2023-10-02 | Discharge: 2023-10-02 | Disposition: A | Discharge status: Home / Self Care | Payer: Medicaid Other | Attending: Emergency Medicine | Admitting: Emergency Medicine

## 2023-10-02 DIAGNOSIS — J45909 Unspecified asthma, uncomplicated: Secondary | ICD-10-CM | POA: Insufficient documentation

## 2023-10-02 DIAGNOSIS — R112 Nausea with vomiting, unspecified: Secondary | ICD-10-CM | POA: Diagnosis present

## 2023-10-02 DIAGNOSIS — R1115 Cyclical vomiting syndrome unrelated to migraine: Secondary | ICD-10-CM

## 2023-10-02 DIAGNOSIS — G43A0 Cyclical vomiting, not intractable: Secondary | ICD-10-CM | POA: Insufficient documentation

## 2023-10-02 LAB — CBC WITH DIFFERENTIAL/PLATELET
Abs Immature Granulocytes: 0.03 10*3/uL (ref 0.00–0.07)
Basophils Absolute: 0 10*3/uL (ref 0.0–0.1)
Basophils Relative: 0 %
Eosinophils Absolute: 0 10*3/uL (ref 0.0–0.5)
Eosinophils Relative: 0 %
HCT: 36 % (ref 36.0–46.0)
Hemoglobin: 10.4 g/dL — ABNORMAL LOW (ref 12.0–15.0)
Immature Granulocytes: 0 %
Lymphocytes Relative: 8 %
Lymphs Abs: 0.9 10*3/uL (ref 0.7–4.0)
MCH: 18.3 pg — ABNORMAL LOW (ref 26.0–34.0)
MCHC: 28.9 g/dL — ABNORMAL LOW (ref 30.0–36.0)
MCV: 63.3 fL — ABNORMAL LOW (ref 80.0–100.0)
Monocytes Absolute: 1 10*3/uL (ref 0.1–1.0)
Monocytes Relative: 9 %
Neutro Abs: 9.1 10*3/uL — ABNORMAL HIGH (ref 1.7–7.7)
Neutrophils Relative %: 83 %
Platelets: 331 10*3/uL (ref 150–400)
RBC Morphology: NONE SEEN
RBC: 5.69 MIL/uL — ABNORMAL HIGH (ref 3.87–5.11)
RDW: 20.6 % — ABNORMAL HIGH (ref 11.5–15.5)
WBC: 11.1 10*3/uL — ABNORMAL HIGH (ref 4.0–10.5)
nRBC: 0 % (ref 0.0–0.2)

## 2023-10-02 LAB — COMPREHENSIVE METABOLIC PANEL
ALT: 15 U/L (ref 0–44)
AST: 16 U/L (ref 15–41)
Albumin: 4.8 g/dL (ref 3.5–5.0)
Alkaline Phosphatase: 55 U/L (ref 38–126)
Anion gap: 11 (ref 5–15)
BUN: 8 mg/dL (ref 6–20)
CO2: 30 mmol/L (ref 22–32)
Calcium: 10.1 mg/dL (ref 8.9–10.3)
Chloride: 100 mmol/L (ref 98–111)
Creatinine, Ser: 0.74 mg/dL (ref 0.44–1.00)
GFR, Estimated: >60 mL/min (ref >60)
Glucose, Bld: 132 mg/dL — ABNORMAL HIGH (ref 70–99)
Potassium: 3.2 mmol/L — ABNORMAL LOW (ref 3.5–5.1)
Sodium: 141 mmol/L (ref 135–145)
Total Bilirubin: 0.5 mg/dL (ref <1.2)
Total Protein: 8.9 g/dL — ABNORMAL HIGH (ref 6.5–8.1)

## 2023-10-02 LAB — LIPASE, BLOOD: Lipase: 19 U/L (ref 11–51)

## 2023-10-02 LAB — HCG, QUANTITATIVE, PREGNANCY: hCG, Beta Chain, Quant, S: <1 m[IU]/mL (ref <5)

## 2023-10-02 MED ORDER — DROPERIDOL 2.5 MG/ML IJ SOLN
1.2500 mg | Freq: Once | INTRAMUSCULAR | Status: AC
  Administered 2023-10-02: 1.25 mg via INTRAVENOUS
  Filled 2023-10-02: qty 2

## 2023-10-02 MED ORDER — SODIUM CHLORIDE 0.9 % IV BOLUS
1000.0000 mL | Freq: Once | INTRAVENOUS | Status: AC
  Administered 2023-10-02: 1000 mL via INTRAVENOUS

## 2023-10-02 MED ORDER — DIAZEPAM 5 MG/ML IJ SOLN
5.0000 mg | Freq: Once | INTRAMUSCULAR | Status: AC
  Administered 2023-10-02: 5 mg via INTRAVENOUS
  Filled 2023-10-02: qty 2

## 2023-10-02 MED ORDER — ONDANSETRON 4 MG PO TBDP: 4.0000 mg | ORAL_TABLET | Freq: Three times a day (TID) | ORAL | 0 refills | Status: DC | PRN

## 2023-10-02 MED ORDER — POTASSIUM CHLORIDE CRYS ER 20 MEQ PO TBCR
40.0000 meq | EXTENDED_RELEASE_TABLET | Freq: Once | ORAL | Status: AC
  Administered 2023-10-02: 40 meq via ORAL
  Filled 2023-10-02: qty 2

## 2023-10-02 MED ORDER — ONDANSETRON HCL 4 MG/2ML IJ SOLN
4.0000 mg | Freq: Once | INTRAMUSCULAR | Status: AC
  Administered 2023-10-02: 4 mg via INTRAVENOUS
  Filled 2023-10-02: qty 2

## 2023-10-02 NOTE — Discharge Instructions (Addendum)
As we discussed, your workup in the ER today was reassuring for acute findings.  Laboratory evaluation did not reveal any emergent cause of your symptoms.  Given that you are feeling better after nausea medication and fluids, no further evaluation is indicated.  I have given you a prescription for Zofran which you received in the emergency department for you to fill and take as prescribed as needed for residual nausea follow-up today.  Given that this seems to be an issue you only experience with your new menstrual cycle, I recommend that you follow-up with OB/GYN to discuss management of these symptoms. I have given you a referral with a number to call for follow-up. Please call at your earliest convenience.   Return if development of any new or worsening symptoms.

## 2023-10-02 NOTE — ED Triage Notes (Signed)
Patient arrives with complaints of worsening abdominal pain and vomiting x1 day. Patient states that this is a recurrent issue that happens while she is on her menstrual cycle.

## 2023-10-02 NOTE — ED Provider Notes (Signed)
I received this patient at signout from previous provider.  In short, she presented to emergency department for evaluation of intractable vomiting.  She has been here before for similar symptoms during her menses.  She was provided Zofran, droperidol, NS, potassium is in emergency department.  She passed p.o. challenge.  Discussed return to emergency department precautions which patient agrees and understands.  All questions answered to her satisfaction.  She is agreeable to discharge at this time.  Will provide her with Zofran prescription.   Judithann Sheen, PA 10/02/23 2111    Derwood Kaplan, MD 10/03/23 [phone]

## 2023-10-02 NOTE — ED Provider Notes (Signed)
[place] EMERGENCY DEPARTMENT AT [facility] Provider Note   CSN: [id number] Arrival date & time: 10/02/23  1226     History  Chief Complaint  Patient presents with   Abdominal Pain   Emesis    Lynn Wilcox is a 42 y.o. female.  Patient with history of GERD, cyclical vomiting, asthma presents today with complaints of nausea and vomiting.  She states that same began 1 day ago and has been persistent since that.  She states that her menses began a few days ago and she had lower abdominal cramping and nausea with that.  States since then she has had worsening nausea and vomiting and unable to tolerate anything orally since yesterday.  She denies any diarrhea.  She is having regular bowel movements.  Denies any recent travel, suspicious foods or drinking from unclean water sources.  She states that this happens almost every month on her menses and she normally comes to the ER for IV fluids and nausea meds and is able to go home. States this feels exactly the same as previous. Denies abdominal pain, fevers, vaginal discharge, urinary symptoms.    The history is provided by the patient. No language interpreter was used.  Abdominal Pain Associated symptoms: nausea and vomiting   Emesis      Home Medications Prior to Admission medications   Medication Sig Start Date End Date Taking? Authorizing Provider  cetirizine (ZYRTEC) 10 MG tablet TAKE 1 TABLET BY MOUTH DAILY 07/07/20   [name], DO  famotidine (PEPCID) 40 MG tablet Take 1 tablet (40 mg total) by mouth daily. 05/03/23   Erick Alley, DO  ferrous sulfate 324 (65 Fe) MG TBEC Take 1 tablet (325 mg total) by mouth every other day. 05/03/23   Erick Alley, DO  ibuprofen (ADVIL,MOTRIN) 600 MG tablet Take 1 tablet (600 mg total) by mouth every 6 (six) hours as needed for mild pain, moderate pain or cramping. 08/24/13   [name], CNM  ondansetron (ZOFRAN-ODT) 4 MG disintegrating tablet 4mg  ODT q4 hours prn  nausea/vomit 08/11/23   Melene Plan, DO  potassium chloride SA (KLOR-CON M) 20 MEQ tablet Take 1 tablet (20 mEq total) by mouth 2 (two) times daily. 08/13/23   Pollyann Savoy, MD  PROAIR HFA 108 [phone] Base) MCG/ACT inhaler INHALE 2 PUFFS INTO THE LUNGS EVERY 6 HOURS AS NEEDED FOR WHEEZING 10/26/22   Lilland, Alana, DO  prochlorperazine (COMPAZINE) 10 MG tablet Take 1 tablet (10 mg total) by mouth 2 (two) times daily as needed for nausea or vomiting. 08/13/23   Pollyann Savoy, MD  promethazine (PHENERGAN) 25 MG suppository Place 1 suppository (25 mg total) rectally every 6 (six) hours as needed for nausea or vomiting. 08/11/23   Melene Plan, DO  valACYclovir (VALTREX) 1000 MG tablet Take 1,000 mg by mouth 3 (three) times daily. Patient not taking: Reported on 05/03/2023 05/01/22   [provider]      Allergies    Chocolate, Coffee bean extract [coffea arabica], Soy allergy (do not select), Tea, and Yeast-derived drug products    Review of Systems   Review of Systems  Gastrointestinal:  Positive for nausea and vomiting.  All other systems reviewed and are negative.   Physical Exam Updated Vital Signs BP (!) 162/83   Pulse 68   Temp 98.7 F (37.1 C) (Oral)   Resp 16   Ht 5\' 2"  (1.575 m)   Wt 105.2 kg   SpO2 99%   BMI 42.42 kg/m  Physical Exam Vitals and nursing note reviewed.  Constitutional:      General: She is not in acute distress.    Appearance: Normal appearance. She is normal weight. She is not ill-appearing, toxic-appearing or diaphoretic.  HENT:     Head: Normocephalic and atraumatic.  Cardiovascular:     Rate and Rhythm: Normal rate.  Pulmonary:     Effort: Pulmonary effort is normal. No respiratory distress.  Abdominal:     General: Abdomen is flat.     Palpations: Abdomen is soft.     Tenderness: There is no abdominal tenderness.  Musculoskeletal:        General: Normal range of motion.     Cervical back: Normal range of motion.  Skin:    General: Skin  is warm and dry.  Neurological:     General: No focal deficit present.     Mental Status: She is alert.  Psychiatric:        Mood and Affect: Mood normal.        Behavior: Behavior normal.     ED Results / Procedures / Treatments   Labs (all labs ordered are listed, but only abnormal results are displayed) Labs Reviewed  COMPREHENSIVE METABOLIC PANEL - Abnormal; Notable for the following components:      Result Value   Potassium 3.2 (*)    Glucose, Bld 132 (*)    Total Protein 8.9 (*)    All other components within normal limits  CBC WITH DIFFERENTIAL/PLATELET - Abnormal; Notable for the following components:   WBC 11.1 (*)    RBC 5.69 (*)    Hemoglobin 10.4 (*)    MCV 63.3 (*)    MCH 18.3 (*)    MCHC 28.9 (*)    RDW 20.6 (*)    Neutro Abs 9.1 (*)    All other components within normal limits  LIPASE, BLOOD  HCG, QUANTITATIVE, PREGNANCY  URINALYSIS, ROUTINE W REFLEX MICROSCOPIC  PREGNANCY, URINE    EKG None  Radiology No results found.  Procedures Procedures    Medications Ordered in ED Medications  sodium chloride 0.9 % bolus 1,000 mL (0 mLs Intravenous Stopped 10/02/23 1455)  ondansetron (ZOFRAN) injection 4 mg (4 mg Intravenous Given 10/02/23 1357)  potassium chloride SA (KLOR-CON M) CR tablet 40 mEq (40 mEq Oral Given 10/02/23 1604)  droperidol (INAPSINE) 2.5 MG/ML injection 1.25 mg (1.25 mg Intravenous Given 10/02/23 1847)  diazepam (VALIUM) injection 5 mg (5 mg Intravenous Given 10/02/23 1848)    ED Course/ Medical Decision Making/ A&P                                 Medical Decision Making Amount and/or Complexity of Data Reviewed Labs: ordered.  Risk Prescription drug management.   This patient is a 42 y.o. female who presents to the ED for concern of nausea and vomiting, this involves an extensive number of treatment options, and is a complaint that carries with it a high risk of complications and morbidity. The emergent differential  diagnosis prior to evaluation includes, but is not limited to, cyclical vomiting, cannabis hyperemesis, DKA/HHS, gastroparesis. This is not an exhaustive differential.   Past Medical History / Co-morbidities / Social History:  has a past medical history of Anxiety and depression (1995), Arthritis (2014), Asthma (1993), Diverticulosis, Esophagitis, H/O gastroesophageal reflux (GERD) (2013), Hiatal hernia, Hypokalemia, Migraines, Obesity, and Seasonal allergies.  Additional history: Chart reviewed. Pertinent results include:  seen several times previously for similar complaints, given fluids and anti-emetics with improvement, discharged home with anti-emetics  Physical Exam: Physical exam performed. The pertinent findings include: abdomen soft and nontender  Lab Tests: I ordered, and personally interpreted labs.  The pertinent results include:  WBC 11.1, hgb consistent with previous. K 3.2, history of same.    Medications: I ordered medication including fluids, oral potassium, zofran, droperidol, valium  for nausea/vomiting. Reevaluation of the patient after these medicines showed that the patient improved. I have reviewed the patients home medicines and have made adjustments as needed.   Disposition:  Patients labs overall reassuring. History of similar symptoms previously. Given zofran with improvement, however unfortunately there was a delay in running the patients pregnancy test and upon completion of work-up several hours later patients symptoms had returned and she began vomiting again. Chart reviewed, appears droperidol has worked previously. Order placed for same. Reassessment pending at shift change. If symptoms resolve and can pass fluid challenge, suspect patient can be discharged home with anti-emetics and recommendations to follow-up with OB/GYN for long term management of these adverse effects of patients menstrual cycle. Discussed same with patient who is understanding and in  agreement.   Care handoff to Sabra Heck, PA-C at shift change. Please see their note for continued evaluation and dispo.  Final Clinical Impression(s) / ED Diagnoses Final diagnoses:  Cyclical vomiting    Rx / DC Orders ED Discharge Orders     None         Vear Clock 10/02/23 Martyn Malay, MD 10/02/23 [phone]

## 2023-10-04 ENCOUNTER — Other Ambulatory Visit: Payer: Self-pay

## 2023-10-04 ENCOUNTER — Encounter (HOSPITAL_BASED_OUTPATIENT_CLINIC_OR_DEPARTMENT_OTHER): Payer: Self-pay | Admitting: Emergency Medicine

## 2023-10-04 ENCOUNTER — Emergency Department (HOSPITAL_BASED_OUTPATIENT_CLINIC_OR_DEPARTMENT_OTHER)
Admission: EM | Admit: 2023-10-04 | Discharge: 2023-10-04 | Disposition: A | Discharge status: Home / Self Care | Payer: Medicaid Other

## 2023-10-04 DIAGNOSIS — R112 Nausea with vomiting, unspecified: Secondary | ICD-10-CM | POA: Diagnosis present

## 2023-10-04 DIAGNOSIS — J45909 Unspecified asthma, uncomplicated: Secondary | ICD-10-CM | POA: Insufficient documentation

## 2023-10-04 DIAGNOSIS — R110 Nausea: Secondary | ICD-10-CM

## 2023-10-04 DIAGNOSIS — R079 Chest pain, unspecified: Secondary | ICD-10-CM | POA: Insufficient documentation

## 2023-10-04 DIAGNOSIS — R109 Unspecified abdominal pain: Secondary | ICD-10-CM | POA: Diagnosis not present

## 2023-10-04 LAB — CBC
HCT: 36.5 % (ref 36.0–46.0)
Hemoglobin: 10.5 g/dL — ABNORMAL LOW (ref 12.0–15.0)
MCH: 18.2 pg — ABNORMAL LOW (ref 26.0–34.0)
MCHC: 28.8 g/dL — ABNORMAL LOW (ref 30.0–36.0)
MCV: 63.1 fL — ABNORMAL LOW (ref 80.0–100.0)
Platelets: 374 10*3/uL (ref 150–400)
RBC: 5.78 MIL/uL — ABNORMAL HIGH (ref 3.87–5.11)
RDW: 20.1 % — ABNORMAL HIGH (ref 11.5–15.5)
WBC: 9 10*3/uL (ref 4.0–10.5)
nRBC: 0 % (ref 0.0–0.2)

## 2023-10-04 LAB — COMPREHENSIVE METABOLIC PANEL
ALT: 17 U/L (ref 0–44)
AST: 33 U/L (ref 15–41)
Albumin: 4.5 g/dL (ref 3.5–5.0)
Alkaline Phosphatase: 49 U/L (ref 38–126)
Anion gap: 13 (ref 5–15)
BUN: 14 mg/dL (ref 6–20)
CO2: 29 mmol/L (ref 22–32)
Calcium: 9.9 mg/dL (ref 8.9–10.3)
Chloride: 97 mmol/L — ABNORMAL LOW (ref 98–111)
Creatinine, Ser: 0.81 mg/dL (ref 0.44–1.00)
GFR, Estimated: >60 mL/min (ref >60)
Glucose, Bld: 138 mg/dL — ABNORMAL HIGH (ref 70–99)
Potassium: 3.6 mmol/L (ref 3.5–5.1)
Sodium: 139 mmol/L (ref 135–145)
Total Bilirubin: 0.5 mg/dL (ref <1.2)
Total Protein: 8.7 g/dL — ABNORMAL HIGH (ref 6.5–8.1)

## 2023-10-04 LAB — LIPASE, BLOOD: Lipase: 16 U/L (ref 11–51)

## 2023-10-04 LAB — HCG, QUANTITATIVE, PREGNANCY: hCG, Beta Chain, Quant, S: <1 m[IU]/mL (ref <5)

## 2023-10-04 MED ORDER — KETOROLAC TROMETHAMINE 15 MG/ML IJ SOLN
15.0000 mg | Freq: Once | INTRAMUSCULAR | Status: AC
  Administered 2023-10-04: 15 mg via INTRAVENOUS
  Filled 2023-10-04: qty 1

## 2023-10-04 MED ORDER — PROCHLORPERAZINE EDISYLATE 10 MG/2ML IJ SOLN
10.0000 mg | Freq: Four times a day (QID) | INTRAMUSCULAR | Status: DC | PRN
  Administered 2023-10-04: 10 mg via INTRAVENOUS
  Filled 2023-10-04: qty 2

## 2023-10-04 MED ORDER — ONDANSETRON HCL 4 MG/2ML IJ SOLN
INTRAMUSCULAR | Status: AC
  Filled 2023-10-04: qty 2

## 2023-10-04 MED ORDER — LACTATED RINGERS IV BOLUS
1000.0000 mL | Freq: Once | INTRAVENOUS | Status: AC
  Administered 2023-10-04: 1000 mL via INTRAVENOUS

## 2023-10-04 MED ORDER — ONDANSETRON HCL 4 MG/2ML IJ SOLN
4.0000 mg | Freq: Once | INTRAMUSCULAR | Status: AC
  Administered 2023-10-04: 4 mg via INTRAVENOUS

## 2023-10-04 NOTE — ED Triage Notes (Signed)
Nausea vomiting x 2 days  "Can't keep anything down." Reports burning in chest

## 2023-10-04 NOTE — ED Provider Notes (Signed)
EMERGENCY DEPARTMENT AT [facility] Provider Note   CSN: [id number] Arrival date & time: 10/04/23  1611     History  Chief Complaint  Patient presents with   Emesis   Chest Pain    Lynn Wilcox is a 42 y.o. female with history of GERD, cyclical vomiting, asthma, presents with concern for nausea and vomiting over the last couple of days.  States that she is on her menses and this nausea and vomiting commonly occur with it.  Reports some associated mild crampy abdominal pain.  She reports she has been unable to keep much down at home.  Denies any fevers, chills, diarrhea.  States this feels like similar episodes of her menses related nausea and vomiting episodes.   Emesis Associated symptoms: no fever   Chest Pain Associated symptoms: nausea and vomiting   Associated symptoms: no fever        Home Medications Prior to Admission medications   Medication Sig Start Date End Date Taking? Authorizing Provider  cetirizine (ZYRTEC) 10 MG tablet TAKE 1 TABLET BY MOUTH DAILY 07/07/20   [name], DO  famotidine (PEPCID) 40 MG tablet Take 1 tablet (40 mg total) by mouth daily. 05/03/23   Erick Alley, DO  ferrous sulfate 324 (65 Fe) MG TBEC Take 1 tablet (325 mg total) by mouth every other day. 05/03/23   Erick Alley, DO  ibuprofen (ADVIL,MOTRIN) 600 MG tablet Take 1 tablet (600 mg total) by mouth every 6 (six) hours as needed for mild pain, moderate pain or cramping. 08/24/13   [name], CNM  ondansetron (ZOFRAN-ODT) 4 MG disintegrating tablet Take 1 tablet (4 mg total) by mouth every 8 (eight) hours as needed. 10/02/23   Judithann Sheen, PA  potassium chloride SA (KLOR-CON M) 20 MEQ tablet Take 1 tablet (20 mEq total) by mouth 2 (two) times daily. 08/13/23   Pollyann Savoy, MD  PROAIR HFA 108 [phone] Base) MCG/ACT inhaler INHALE 2 PUFFS INTO THE LUNGS EVERY 6 HOURS AS NEEDED FOR WHEEZING 10/26/22   Lilland, Alana, DO  prochlorperazine (COMPAZINE) 10 MG  tablet Take 1 tablet (10 mg total) by mouth 2 (two) times daily as needed for nausea or vomiting. 08/13/23   Pollyann Savoy, MD  promethazine (PHENERGAN) 25 MG suppository Place 1 suppository (25 mg total) rectally every 6 (six) hours as needed for nausea or vomiting. 08/11/23   Melene Plan, DO  valACYclovir (VALTREX) 1000 MG tablet Take 1,000 mg by mouth 3 (three) times daily. Patient not taking: Reported on 05/03/2023 05/01/22   [provider]      Allergies    Chocolate, Coffee bean extract [coffea arabica], Soy allergy (do not select), Tea, and Yeast-derived drug products    Review of Systems   Review of Systems  Constitutional:  Negative for fever.  Gastrointestinal:  Positive for nausea and vomiting.    Physical Exam Updated Vital Signs BP (!) 150/75   Pulse (!) 102   Temp 98.2 F (36.8 C) (Oral)   Resp 18   LMP 10/01/2023 (Approximate)   SpO2 99%  Physical Exam Vitals and nursing note reviewed.  Constitutional:      General: She is not in acute distress.    Appearance: She is well-developed.     Comments: Patient appears slightly nauseous, but no episodes of emesis noted while being in the ER  HENT:     Head: Normocephalic and atraumatic.  Eyes:     Conjunctiva/sclera: Conjunctivae normal.  Cardiovascular:  Rate and Rhythm: Normal rate and regular rhythm.     Heart sounds: No murmur heard. Pulmonary:     Effort: Pulmonary effort is normal. No respiratory distress.     Breath sounds: Normal breath sounds.  Abdominal:     Palpations: Abdomen is soft.     Tenderness: There is no abdominal tenderness.  Musculoskeletal:        General: No swelling.     Cervical back: Neck supple.  Skin:    General: Skin is warm and dry.     Capillary Refill: Capillary refill takes less than 2 seconds.  Neurological:     Mental Status: She is alert.  Psychiatric:        Mood and Affect: Mood normal.     ED Results / Procedures / Treatments   Labs (all labs  ordered are listed, but only abnormal results are displayed) Labs Reviewed  COMPREHENSIVE METABOLIC PANEL - Abnormal; Notable for the following components:      Result Value   Chloride 97 (*)    Glucose, Bld 138 (*)    Total Protein 8.7 (*)    All other components within normal limits  CBC - Abnormal; Notable for the following components:   RBC 5.78 (*)    Hemoglobin 10.5 (*)    MCV 63.1 (*)    MCH 18.2 (*)    MCHC 28.8 (*)    RDW 20.1 (*)    All other components within normal limits  LIPASE, BLOOD  HCG, QUANTITATIVE, PREGNANCY    EKG None  Radiology No results found.  Procedures Procedures    Medications Ordered in ED Medications  ondansetron (ZOFRAN) 4 MG/2ML injection (  Not Given 10/04/23 1651)  prochlorperazine (COMPAZINE) injection 10 mg (10 mg Intravenous Given 10/04/23 1909)  ondansetron (ZOFRAN) injection 4 mg (4 mg Intravenous Given 10/04/23 1649)  lactated ringers bolus 1,000 mL (0 mLs Intravenous Stopped 10/04/23 2134)  ketorolac (TORADOL) 15 MG/ML injection 15 mg (15 mg Intravenous Given 10/04/23 1909)    ED Course/ Medical Decision Making/ A&P                                 Medical Decision Making Amount and/or Complexity of Data Reviewed Labs: ordered.  Risk Prescription drug management.     Differential diagnosis includes but is not limited to Cholelithiasis, cholangitis, choledocholithiasis, peptic ulcer, gastritis, gastroenteritis, appendicitis, IBS, IBD, DKA, nephrolithiasis, UTI, pyelonephritis, pancreatitis, diverticulitis, mesenteric ischemia, abdominal aortic aneurysm, small bowel obstruction, volvulus, testicular torsion, ovarian torsion, and females of childbearing age pregnancy   ED Course:  Patient overall well-appearing, stable vital signs.  I did not see patient have any episodes of emesis while in the ER.  States she has not been able to keep anything down at home and has felt very nauseous.  She was in the ER yesterday for the  same concern, and received IV fluids, Zofran, droperidol, Valium, which she reported helped her symptoms overnight.  Abdomen is soft and nontender.  CBC at baseline, no leukocytosis.  Lipase within normal limits.  CMP with normal electrolytes, kidney, and LFTs.  Pregnancy negative.  States this feels the same as other menses related episodes of nausea and vomiting.  Low concern for any acute abdominal pathology at this time, no indication for further imaging. Patient given LR bolus, Toradol for cramping pain, Zofran and Compazine for nausea Upon re-evaluation, patient remains without any episodes of emesis in  the ER.  She reports feeling better and feels she is able to go home at this time.  I also feel patient is appropriate for discharge home at this time.  Impression: Nausea  Disposition:  The patient was discharged home with instructions to take the Zofran that was prescribed yesterday for her nausea and vomiting.  Keep well-hydrated at home. Return precautions given.  External records from outside source obtained and reviewed including ER visit from yesterday for same complaint of nausea and vomiting              Final Clinical Impression(s) / ED Diagnoses Final diagnoses:  Nausea    Rx / DC Orders ED Discharge Orders     None         Arabella Merles, Cordelia Poche 10/04/23 2141    Durwin Glaze, MD 10/04/23 2329

## 2023-10-04 NOTE — Discharge Instructions (Addendum)
Your workup is reassuring today.  Your electrolytes, kidney, pancreas, and liver labs are normal.  Your blood counts are also appear baseline.  You were prescribed Zofran yesterday for your nausea.  You may continue taking this at home as prescribed.  Please continue to keep well-hydrated at home.  Return to the ER if you are unable to keep any food or liquids down, you have severe abdominal pain, fevers, any other new or concerning symptoms.

## 2023-10-04 NOTE — ED Notes (Signed)
ED Provider at bedside. 

## 2023-10-29 ENCOUNTER — Other Ambulatory Visit: Payer: Self-pay

## 2023-10-29 ENCOUNTER — Encounter (HOSPITAL_BASED_OUTPATIENT_CLINIC_OR_DEPARTMENT_OTHER): Payer: Self-pay | Admitting: Emergency Medicine

## 2023-10-29 ENCOUNTER — Emergency Department (HOSPITAL_BASED_OUTPATIENT_CLINIC_OR_DEPARTMENT_OTHER)
Admission: EM | Admit: 2023-10-29 | Discharge: 2023-10-29 | Disposition: A | Discharge status: Home / Self Care | Payer: Medicaid Other | Attending: Emergency Medicine | Admitting: Emergency Medicine

## 2023-10-29 DIAGNOSIS — E876 Hypokalemia: Secondary | ICD-10-CM | POA: Diagnosis not present

## 2023-10-29 DIAGNOSIS — R000 Tachycardia, unspecified: Secondary | ICD-10-CM | POA: Diagnosis not present

## 2023-10-29 DIAGNOSIS — R1115 Cyclical vomiting syndrome unrelated to migraine: Secondary | ICD-10-CM | POA: Diagnosis not present

## 2023-10-29 DIAGNOSIS — R1110 Vomiting, unspecified: Secondary | ICD-10-CM | POA: Diagnosis present

## 2023-10-29 LAB — COMPREHENSIVE METABOLIC PANEL
ALT: 12 U/L (ref 0–44)
AST: 14 U/L — ABNORMAL LOW (ref 15–41)
Albumin: 4.5 g/dL (ref 3.5–5.0)
Alkaline Phosphatase: 53 U/L (ref 38–126)
Anion gap: 16 — ABNORMAL HIGH (ref 5–15)
BUN: 12 mg/dL (ref 6–20)
CO2: 29 mmol/L (ref 22–32)
Calcium: 9.8 mg/dL (ref 8.9–10.3)
Chloride: 90 mmol/L — ABNORMAL LOW (ref 98–111)
Creatinine, Ser: 0.71 mg/dL (ref 0.44–1.00)
GFR, Estimated: >60 mL/min (ref >60)
Glucose, Bld: 132 mg/dL — ABNORMAL HIGH (ref 70–99)
Potassium: 2.7 mmol/L — CL (ref 3.5–5.1)
Sodium: 135 mmol/L (ref 135–145)
Total Bilirubin: 0.6 mg/dL (ref 0.0–1.2)
Total Protein: 8.6 g/dL — ABNORMAL HIGH (ref 6.5–8.1)

## 2023-10-29 LAB — CBC WITH DIFFERENTIAL/PLATELET
Abs Immature Granulocytes: 0.05 10*3/uL (ref 0.00–0.07)
Basophils Absolute: 0 10*3/uL (ref 0.0–0.1)
Basophils Relative: 0 %
Eosinophils Absolute: 0 10*3/uL (ref 0.0–0.5)
Eosinophils Relative: 0 %
HCT: 34 % — ABNORMAL LOW (ref 36.0–46.0)
Hemoglobin: 9.8 g/dL — ABNORMAL LOW (ref 12.0–15.0)
Immature Granulocytes: 0 %
Lymphocytes Relative: 19 %
Lymphs Abs: 2.4 10*3/uL (ref 0.7–4.0)
MCH: 18 pg — ABNORMAL LOW (ref 26.0–34.0)
MCHC: 28.8 g/dL — ABNORMAL LOW (ref 30.0–36.0)
MCV: 62.6 fL — ABNORMAL LOW (ref 80.0–100.0)
Monocytes Absolute: 1.1 10*3/uL — ABNORMAL HIGH (ref 0.1–1.0)
Monocytes Relative: 9 %
Neutro Abs: 8.8 10*3/uL — ABNORMAL HIGH (ref 1.7–7.7)
Neutrophils Relative %: 72 %
Platelets: 406 10*3/uL — ABNORMAL HIGH (ref 150–400)
RBC: 5.43 MIL/uL — ABNORMAL HIGH (ref 3.87–5.11)
RDW: 19.4 % — ABNORMAL HIGH (ref 11.5–15.5)
WBC: 12.4 10*3/uL — ABNORMAL HIGH (ref 4.0–10.5)
nRBC: 0 % (ref 0.0–0.2)

## 2023-10-29 LAB — MAGNESIUM: Magnesium: 1.9 mg/dL (ref 1.7–2.4)

## 2023-10-29 LAB — HCG, QUANTITATIVE, PREGNANCY: hCG, Beta Chain, Quant, S: <1 m[IU]/mL (ref <5)

## 2023-10-29 LAB — LIPASE, BLOOD: Lipase: 17 U/L (ref 11–51)

## 2023-10-29 MED ORDER — POTASSIUM CHLORIDE 10 MEQ/100ML IV SOLN
10.0000 meq | Freq: Once | INTRAVENOUS | Status: AC
  Administered 2023-10-29: 10 meq via INTRAVENOUS
  Filled 2023-10-29: qty 100

## 2023-10-29 MED ORDER — ALUM & MAG HYDROXIDE-SIMETH 200-200-20 MG/5ML PO SUSP
30.0000 mL | Freq: Once | ORAL | Status: AC
  Administered 2023-10-29: 30 mL via ORAL
  Filled 2023-10-29: qty 30

## 2023-10-29 MED ORDER — DROPERIDOL 2.5 MG/ML IJ SOLN: 0.6250 mg | Freq: Once | INTRAMUSCULAR | Status: DC

## 2023-10-29 MED ORDER — DROPERIDOL 2.5 MG/ML IJ SOLN
1.2500 mg | Freq: Once | INTRAMUSCULAR | Status: AC
  Administered 2023-10-29: 1.25 mg via INTRAVENOUS
  Filled 2023-10-29: qty 2

## 2023-10-29 MED ORDER — POTASSIUM CHLORIDE CRYS ER 20 MEQ PO TBCR: 20.0000 meq | EXTENDED_RELEASE_TABLET | Freq: Two times a day (BID) | ORAL | 0 refills | Status: DC

## 2023-10-29 MED ORDER — PROMETHAZINE HCL 25 MG RE SUPP: 25.0000 mg | Freq: Four times a day (QID) | RECTAL | 0 refills | Status: DC | PRN

## 2023-10-29 MED ORDER — ONDANSETRON 4 MG PO TBDP
8.0000 mg | ORAL_TABLET | Freq: Once | ORAL | Status: AC
  Administered 2023-10-29: 8 mg via ORAL
  Filled 2023-10-29: qty 2

## 2023-10-29 MED ORDER — POTASSIUM CHLORIDE CRYS ER 20 MEQ PO TBCR
40.0000 meq | EXTENDED_RELEASE_TABLET | Freq: Once | ORAL | Status: AC
  Administered 2023-10-29: 40 meq via ORAL
  Filled 2023-10-29: qty 2

## 2023-10-29 MED ORDER — ONDANSETRON 8 MG PO TBDP: 8.0000 mg | ORAL_TABLET | Freq: Three times a day (TID) | ORAL | 0 refills | Status: DC | PRN

## 2023-10-29 MED ORDER — LACTATED RINGERS IV BOLUS
1000.0000 mL | Freq: Once | INTRAVENOUS | Status: AC
  Administered 2023-10-29: 1000 mL via INTRAVENOUS

## 2023-10-29 NOTE — ED Notes (Signed)
Pt has refused the oral K+ at this time. Pt stated she would end up vomiting them up. Pt was given nausea medication and advised I would be back to see how she is feeling and possibly take them a little later.

## 2023-10-29 NOTE — ED Triage Notes (Signed)
Pt c/o upper ABD pain with n/v x 3 days

## 2023-10-29 NOTE — Discharge Instructions (Addendum)
You were seen in the ER for severe nausea and vomiting.  You are found to have low potassium. We also noted, that your heart rate will fluctuate between 80 and 130 in the ER.  At this time, it is unclear with the root cause for your symptoms.  We recommend that you follow-up with PCP as soon as possible for further assessment.  Please return to the ER if your symptoms worsen; you have increased pain, fevers, chills, inability to keep any medications down, confusion, dizziness, near fainting. Otherwise see the outpatient doctor as requested.

## 2023-10-29 NOTE — ED Notes (Signed)
Pt is drinking Apple juice for PO challenge at this time.

## 2023-10-29 NOTE — ED Provider Notes (Addendum)
[place] EMERGENCY DEPARTMENT AT [facility] Provider Note   CSN: [id number] Arrival date & time: 10/29/23  1447     History  Chief Complaint  Patient presents with   Abdominal Pain    Lynn Wilcox is a 43 y.o. female.  HPI     43 year old female comes in with chief complaint of abdominal pain, nausea, vomiting.  Her primary complaint is nausea and vomiting.  She states that the abdominal pain is a result of her vomiting and having acid reflux.  Patient started feeling sick about 3 days ago.  She has been having ongoing nausea and vomiting around the time she starts having her periods for the last several months.  She states that she is having 10+ episodes of emesis at home.  She decided to come in because she started having dizziness, feeling dehydrated and having significant weakness.  Patient has not seen PCP for this because of work-related conflict.  She denies any illicit drug use, previous history of abdominal surgery.  Patient has been seen in the ER for same reason multiple times in the recent past.  Indicates that normally she gets better when she has to come to the ER, and remains symptoms free until her symptoms recur.  She does not think she is pregnant.  She has no UTI symptoms.  Home Medications Prior to Admission medications   Medication Sig Start Date End Date Taking? Authorizing Provider  ondansetron (ZOFRAN-ODT) 8 MG disintegrating tablet Take 1 tablet (8 mg total) by mouth every 8 (eight) hours as needed for nausea. 10/29/23  Yes Biff Rutigliano, MD  potassium chloride SA (KLOR-CON M) 20 MEQ tablet Take 1 tablet (20 mEq total) by mouth 2 (two) times daily. 10/29/23  Yes Derwood Kaplan, MD  promethazine (PHENERGAN) 25 MG suppository Place 1 suppository (25 mg total) rectally every 6 (six) hours as needed for nausea. 10/29/23  Yes Derwood Kaplan, MD  cetirizine (ZYRTEC) 10 MG tablet TAKE 1 TABLET BY MOUTH DAILY 07/07/20   [name], DO  famotidine (PEPCID)  40 MG tablet Take 1 tablet (40 mg total) by mouth daily. 05/03/23   Erick Alley, DO  ferrous sulfate 324 (65 Fe) MG TBEC Take 1 tablet (325 mg total) by mouth every other day. 05/03/23   Erick Alley, DO  ibuprofen (ADVIL,MOTRIN) 600 MG tablet Take 1 tablet (600 mg total) by mouth every 6 (six) hours as needed for mild pain, moderate pain or cramping. 08/24/13   [name], CNM  PROAIR HFA 108 [phone] Base) MCG/ACT inhaler INHALE 2 PUFFS INTO THE LUNGS EVERY 6 HOURS AS NEEDED FOR WHEEZING 10/26/22   Lilland, Alana, DO  valACYclovir (VALTREX) 1000 MG tablet Take 1,000 mg by mouth 3 (three) times daily. Patient not taking: Reported on 05/03/2023 05/01/22   [provider]      Allergies    Chocolate, Coffee bean extract [coffea arabica], Soy allergy (do not select), Tea, and Yeast-derived drug products    Review of Systems   Review of Systems  All other systems reviewed and are negative.   Physical Exam Updated Vital Signs BP (!) 142/76 (BP Location: Left Arm)   Pulse (!) 110   Temp 97.9 F (36.6 C) (Oral)   Resp 16   Wt 106 kg   LMP 09/27/2023 (Approximate)   SpO2 98%   BMI 42.74 kg/m  Physical Exam Vitals and nursing note reviewed.  Constitutional:      Appearance: She is well-developed.  HENT:  Head: Normocephalic and atraumatic.  Eyes:     Extraocular Movements: Extraocular movements intact.  Cardiovascular:     Rate and Rhythm: Normal rate.  Pulmonary:     Effort: Pulmonary effort is normal.  Abdominal:     Tenderness: There is abdominal tenderness. There is no guarding or rebound.  Musculoskeletal:     Cervical back: Normal range of motion and neck supple.  Skin:    General: Skin is dry.     Comments: Patient appears dry  Neurological:     Mental Status: She is alert and oriented to person, place, and time.     ED Results / Procedures / Treatments   Labs (all labs ordered are listed, but only abnormal results are displayed) Labs Reviewed   COMPREHENSIVE METABOLIC PANEL - Abnormal; Notable for the following components:      Result Value   Potassium 2.7 (*)    Chloride 90 (*)    Glucose, Bld 132 (*)    Total Protein 8.6 (*)    AST 14 (*)    Anion gap 16 (*)    All other components within normal limits  CBC WITH DIFFERENTIAL/PLATELET - Abnormal; Notable for the following components:   WBC 12.4 (*)    RBC 5.43 (*)    Hemoglobin 9.8 (*)    HCT 34.0 (*)    MCV 62.6 (*)    MCH 18.0 (*)    MCHC 28.8 (*)    RDW 19.4 (*)    Platelets 406 (*)    Neutro Abs 8.8 (*)    Monocytes Absolute 1.1 (*)    All other components within normal limits  HCG, QUANTITATIVE, PREGNANCY  LIPASE, BLOOD  MAGNESIUM    EKG EKG Interpretation Date/Time:  Monday October 29 2023 16:42:59 EST Ventricular Rate:  88 PR Interval:  126 QRS Duration:  82 QT Interval:  356 QTC Calculation: 431 R Axis:   57  Text Interpretation: Sinus rhythm No acute changes No significant change since last tracing normal intervals Confirmed by Derwood Kaplan [phone]) on 10/29/2023 4:47:04 PM  Radiology No results found.  Procedures Procedures    Medications Ordered in ED Medications  lactated ringers bolus 1,000 mL ( Intravenous Stopped 10/29/23 1845)  alum & mag hydroxide-simeth (MAALOX/MYLANTA) 200-200-20 MG/5ML suspension 30 mL (30 mLs Oral Given 10/29/23 1727)  droperidol (INAPSINE) 2.5 MG/ML injection 1.25 mg (1.25 mg Intravenous Given 10/29/23 1726)  potassium chloride 10 mEq in 100 mL IVPB (0 mEq Intravenous Stopped 10/29/23 1845)  potassium chloride SA (KLOR-CON M) CR tablet 40 mEq (40 mEq Oral Given 10/29/23 2118)  ondansetron (ZOFRAN-ODT) disintegrating tablet 8 mg (8 mg Oral Given 10/29/23 1917)    ED Course/ Medical Decision Making/ A&P Clinical Course as of 10/29/23 2224  Mon Oct 29, 2023  1845 Patient reassessed.  She has not had any emesis since being in the room. P.o. challenge to be initiated.  Patient is a little scared, therefore she will  get Zofran prior to starting the p.o. challenge.  Oral potassium ordered. [AN]    Clinical Course User Index [AN] Derwood Kaplan, MD                                 Medical Decision Making Amount and/or Complexity of Data Reviewed Labs: ordered.  Risk OTC drugs. Prescription drug management.  This patient presents to the ED with chief complaint(s) of nausea, vomiting with pertinent past medical history  of GERD, cyclical nature to her nausea and vomiting.The complaint involves an extensive differential diagnosis and also carries with it a high risk of complications and morbidity.    The differential diagnosis includes : Cyclic vomiting syndrome, cannabinoid hyperemesis syndrome, endometriosis. Additionally, from the severe nausea and vomiting, differential diagnosis includes renal failure, severe electrolyte abnormality, orthostatic dizziness, dehydration.  The initial plan is to get basic labs.  Patient appears dry, will give her IV fluids.   Additional history obtained: Records reviewed  previous ED visits, previous imaging and also treatment pathways.  No recent CT scan noted.  Independent labs interpretation:  The following labs were independently interpreted: Patient has hypokalemia.  She also has low chloride, indicating normal bicarb, fairly normal white count and LFTs.  Magnesium is normal.  Pregnancy test is negative.  Treatment and Reassessment: Pt reassessed. Pt's VSS and WNL. Pt's cap refill < 3 seconds. Pt has been hydrated in the ER and now passed po challenge. We will discharge with antiemetic. Strict ER return precautions have been discussed and pt will return if he is unable to tolerate fluids and symptoms are getting worse.  Admission considered, however patient passed oral challenge. Tachycardia noted.  Patient denies any history of PE, DVT and there are no risk factors for the same.  All of her symptoms are undifferentiated.  I do not think she merits  admission, but tachycardia, nausea, vomiting that is becoming recurrent will need further assessment by PCP.  Encouraged patient to make sure that she follows up with PCP as soon as possible.  Patient in agreement. Final Clinical Impression(s) / ED Diagnoses Final diagnoses:  Acute hypokalemia  Cyclic vomiting syndrome  Tachycardia    Rx / DC Orders ED Discharge Orders          Ordered    potassium chloride SA (KLOR-CON M) 20 MEQ tablet  2 times daily        10/29/23 2222    promethazine (PHENERGAN) 25 MG suppository  Every 6 hours PRN        10/29/23 2222    ondansetron (ZOFRAN-ODT) 8 MG disintegrating tablet  Every 8 hours PRN        10/29/23 2222              Derwood Kaplan, MD 10/29/23 2224    Derwood Kaplan, MD 10/29/23 2224

## 2023-10-29 NOTE — ED Notes (Signed)
Spoke with lab to add on mag. 

## 2023-11-26 ENCOUNTER — Encounter (HOSPITAL_BASED_OUTPATIENT_CLINIC_OR_DEPARTMENT_OTHER): Payer: Self-pay

## 2023-11-26 ENCOUNTER — Emergency Department (HOSPITAL_BASED_OUTPATIENT_CLINIC_OR_DEPARTMENT_OTHER)
Admission: EM | Admit: 2023-11-26 | Discharge: 2023-11-26 | Disposition: A | Discharge status: Home / Self Care | Payer: Medicaid Other | Attending: Emergency Medicine | Admitting: Emergency Medicine

## 2023-11-26 ENCOUNTER — Other Ambulatory Visit: Payer: Self-pay

## 2023-11-26 DIAGNOSIS — R112 Nausea with vomiting, unspecified: Secondary | ICD-10-CM | POA: Insufficient documentation

## 2023-11-26 DIAGNOSIS — R109 Unspecified abdominal pain: Secondary | ICD-10-CM | POA: Diagnosis not present

## 2023-11-26 DIAGNOSIS — J45909 Unspecified asthma, uncomplicated: Secondary | ICD-10-CM | POA: Diagnosis not present

## 2023-11-26 DIAGNOSIS — Z87891 Personal history of nicotine dependence: Secondary | ICD-10-CM | POA: Diagnosis not present

## 2023-11-26 DIAGNOSIS — K219 Gastro-esophageal reflux disease without esophagitis: Secondary | ICD-10-CM | POA: Insufficient documentation

## 2023-11-26 LAB — COMPREHENSIVE METABOLIC PANEL
ALT: 14 U/L (ref 0–44)
AST: 16 U/L (ref 15–41)
Albumin: 4.4 g/dL (ref 3.5–5.0)
Alkaline Phosphatase: 49 U/L (ref 38–126)
Anion gap: 11 (ref 5–15)
BUN: 12 mg/dL (ref 6–20)
CO2: 31 mmol/L (ref 22–32)
Calcium: 9.3 mg/dL (ref 8.9–10.3)
Chloride: 90 mmol/L — ABNORMAL LOW (ref 98–111)
Creatinine, Ser: 0.71 mg/dL (ref 0.44–1.00)
GFR, Estimated: >60 mL/min (ref >60)
Glucose, Bld: 125 mg/dL — ABNORMAL HIGH (ref 70–99)
Potassium: 3 mmol/L — ABNORMAL LOW (ref 3.5–5.1)
Sodium: 132 mmol/L — ABNORMAL LOW (ref 135–145)
Total Bilirubin: 0.5 mg/dL (ref 0.0–1.2)
Total Protein: 8.5 g/dL — ABNORMAL HIGH (ref 6.5–8.1)

## 2023-11-26 LAB — CBC
HCT: 30.8 % — ABNORMAL LOW (ref 36.0–46.0)
Hemoglobin: 9.2 g/dL — ABNORMAL LOW (ref 12.0–15.0)
MCH: 18.9 pg — ABNORMAL LOW (ref 26.0–34.0)
MCHC: 29.9 g/dL — ABNORMAL LOW (ref 30.0–36.0)
MCV: 63.4 fL — ABNORMAL LOW (ref 80.0–100.0)
Platelets: 320 10*3/uL (ref 150–400)
RBC: 4.86 MIL/uL (ref 3.87–5.11)
RDW: 18.9 % — ABNORMAL HIGH (ref 11.5–15.5)
WBC: 10.5 10*3/uL (ref 4.0–10.5)
nRBC: 0 % (ref 0.0–0.2)

## 2023-11-26 LAB — LIPASE, BLOOD: Lipase: 15 U/L (ref 11–51)

## 2023-11-26 LAB — MAGNESIUM: Magnesium: 2.4 mg/dL (ref 1.7–2.4)

## 2023-11-26 MED ORDER — ALUM & MAG HYDROXIDE-SIMETH 200-200-20 MG/5ML PO SUSP
30.0000 mL | Freq: Once | ORAL | Status: AC
  Administered 2023-11-26: 30 mL via ORAL
  Filled 2023-11-26: qty 30

## 2023-11-26 MED ORDER — HALOPERIDOL LACTATE 5 MG/ML IJ SOLN
2.0000 mg | Freq: Once | INTRAMUSCULAR | Status: AC
  Administered 2023-11-26: 2 mg via INTRAVENOUS
  Filled 2023-11-26: qty 1

## 2023-11-26 MED ORDER — SODIUM CHLORIDE 0.9 % IV BOLUS
1000.0000 mL | Freq: Once | INTRAVENOUS | Status: AC
  Administered 2023-11-26: 1000 mL via INTRAVENOUS

## 2023-11-26 MED ORDER — PROMETHAZINE HCL 25 MG PO TABS: 25.0000 mg | ORAL_TABLET | Freq: Four times a day (QID) | ORAL | 0 refills | Status: DC | PRN

## 2023-11-26 MED ORDER — ONDANSETRON 4 MG PO TBDP
4.0000 mg | ORAL_TABLET | Freq: Once | ORAL | Status: AC | PRN
  Administered 2023-11-26: 4 mg via ORAL
  Filled 2023-11-26: qty 1

## 2023-11-26 MED ORDER — POTASSIUM CHLORIDE 10 MEQ/100ML IV SOLN
10.0000 meq | INTRAVENOUS | Status: AC
  Administered 2023-11-26 (×3): 10 meq via INTRAVENOUS
  Filled 2023-11-26 (×2): qty 100

## 2023-11-26 NOTE — ED Triage Notes (Signed)
 Pt POV with boyfriend d/t vomiting since Friday.  Pt states she does this every time when she starts her menstrual cycle.  She states she comes here for fluids, Potassium and  a GI Cocktail.

## 2023-11-26 NOTE — ED Provider Notes (Signed)
 [facility]GENCY [facility] Emergency Department Provider Note MRN:  [id number]  Arrival date & time: 11/26/23     Chief Complaint   Emesis   History of Present Illness   Lynn Wilcox is a 43 y.o. year-old female with a history of anxiety and depression presenting to the ED with chief complaint of emesis.  Persistent nausea vomiting and abdominal discomfort for the past few days.  This happens every menstrual cycle.  Comes here for some fluids and potassium.  Denies fever.  Review of Systems  A thorough review of systems was obtained and all systems are negative except as noted in the HPI and PMH.   Patient's Health History    Past Medical History:  Diagnosis Date   Anxiety and depression 1995   Arthritis 2014   Asthma 1993   Uses inhaler prn   Diverticulosis    Esophagitis    H/O gastroesophageal reflux (GERD) 2013   Hiatal hernia    Hypokalemia    Migraines    Obesity    Seasonal allergies     Past Surgical History:  Procedure Laterality Date   BREAST BIOPSY     age 52   WISDOM TOOTH EXTRACTION      Family History  Problem Relation Age of Onset   Stroke Mother    Hypertension Mother    Hyperlipidemia Mother    Colon cancer Neg Hx    Stomach cancer Neg Hx    Esophageal cancer Neg Hx     Social History   Socioeconomic History   Marital status: Single    Spouse name: Not on file   Number of children: Not on file   Years of education: Not on file   Highest education level: Not on file  Occupational History   Not on file  Tobacco Use   Smoking status: Former    Current packs/day: 0.00    Types: Cigarettes    Quit date: 2003    Years since quitting: 22.1   Smokeless tobacco: Never  Vaping Use   Vaping status: Never Used  Substance and Sexual Activity   Alcohol use: Never   Drug use: Yes    Frequency: 2.0 times per week    Types: Marijuana   Sexual activity: Yes    Partners: Male    Birth control/protection: Condom  Other Topics Concern    Not on file  Social History Narrative   Not on file   Social Drivers of Health   Financial Resource Strain: Not on file  Food Insecurity: Not on file  Transportation Needs: Not on file  Physical Activity: Not on file  Stress: Not on file  Social Connections: Unknown (02/13/2022)   Received from [facility]   Social Network    Social Network: Not on file  Intimate Partner Violence: Not At Risk (05/01/2023)   Received from [facility]   HITS    Over the last 12 months how often did your partner physically hurt you?: Never    Over the last 12 months how often did your partner insult you or talk down to you?: Never    Over the last 12 months how often did your partner threaten you with physical harm?: Never    Over the last 12 months how often did your partner scream or curse at you?: Never     Physical Exam   Vitals:   11/26/23 0455 11/26/23 0456  BP:  (!) 158/104  Pulse:  (!) 114  Resp:  19  Temp: 97.6 F (36.4 C)   SpO2:  100%    CONSTITUTIONAL: Well-appearing, NAD NEURO/PSYCH:  Alert and oriented x 3, no focal deficits EYES:  eyes equal and reactive ENT/NECK:  no LAD, no JVD CARDIO: Regular rate, well-perfused, normal S1 and S2 PULM:  CTAB no wheezing or rhonchi GI/GU:  non-distended, non-tender MSK/SPINE:  No gross deformities, no edema SKIN:  no rash, atraumatic   *Additional and/or pertinent findings included in MDM below  Diagnostic and Interventional Summary    EKG Interpretation Date/Time:    Ventricular Rate:    PR Interval:    QRS Duration:    QT Interval:    QTC Calculation:   R Axis:      Text Interpretation:         Labs Reviewed  COMPREHENSIVE METABOLIC PANEL - Abnormal; Notable for the following components:      Result Value   Sodium 132 (*)    Potassium 3.0 (*)    Chloride 90 (*)    Glucose, Bld 125 (*)    Total Protein 8.5 (*)    All other components within normal limits  CBC - Abnormal; Notable for the  following components:   Hemoglobin 9.2 (*)    HCT 30.8 (*)    MCV 63.4 (*)    MCH 18.9 (*)    MCHC 29.9 (*)    RDW 18.9 (*)    All other components within normal limits  LIPASE, BLOOD  MAGNESIUM  URINALYSIS, ROUTINE W REFLEX MICROSCOPIC  PREGNANCY, URINE    No orders to display    Medications  potassium chloride 10 mEq in 100 mL IVPB (10 mEq Intravenous New Bag/Given 11/26/23 0456)  ondansetron (ZOFRAN-ODT) disintegrating tablet 4 mg (4 mg Oral Given 11/26/23 0148)  sodium chloride 0.9 % bolus 1,000 mL (0 mLs Intravenous Stopped 11/26/23 0457)  sodium chloride 0.9 % bolus 1,000 mL (1,000 mLs Intravenous New Bag/Given 11/26/23 0255)  haloperidol lactate (HALDOL) injection 2 mg (2 mg Intravenous Given 11/26/23 0251)  alum & mag hydroxide-simeth (MAALOX/MYLANTA) 200-200-20 MG/5ML suspension 30 mL (30 mLs Oral Given 11/26/23 0454)     Procedures  /  Critical Care Procedures  ED Course and Medical Decision Making  Initial Impression and Ddx Cyclical vomiting, happening every month during menstrual cycle.  Providing Haldol and fluids and will reassess.  Past medical/surgical history that increases complexity of ED encounter: None  Interpretation of Diagnostics I personally reviewed the laboratory evaluation and my interpretation is as follows: Mild hypokalemia otherwise no significant blood count or electrolyte disturbance    Patient Reassessment and Ultimate Disposition/Management     With medication as listed above patient is feeling much better, on my repeat evaluation sleeping comfortably, wakes easily, appropriate for discharge.  Patient management required discussion with the following services or consulting groups:  None  Complexity of Problems Addressed Acute illness or injury that poses threat of life of bodily function  Additional Data Reviewed and Analyzed Further history obtained from: Further history from spouse/family member  Additional Factors Impacting ED  Encounter Risk Prescriptions  Elmer Sow. Pilar Plate, MD [facility] [email or web address]  Final Clinical Impressions(s) / ED Diagnoses     ICD-10-CM   1. Nausea and vomiting, unspecified vomiting type  R11.2     2. Abdominal discomfort  R10.9       ED Discharge Orders          Ordered    promethazine (PHENERGAN) 25  MG tablet  Every 6 hours PRN        11/26/23 0519             Discharge Instructions Discussed with and Provided to Patient:     Discharge Instructions      You were evaluated in the Emergency Department and after careful evaluation, we did not find any emergent condition requiring admission or further testing in the hospital.  Your exam/testing today is overall reassuring.  Recommend follow-up with your primary care doctor, gastroenterology, and OB/GYN for continued care.  Please return to the Emergency Department if you experience any worsening of your condition.   Thank you for allowing [name] to be a part of your care.       Sabas Sous, MD 11/26/23 [phone]

## 2023-11-26 NOTE — Discharge Instructions (Signed)
 You were evaluated in the Emergency Department and after careful evaluation, we did not find any emergent condition requiring admission or further testing in the hospital.  Your exam/testing today is overall reassuring.  Recommend follow-up with your primary care doctor, gastroenterology, and OB/GYN for continued care.  Please return to the Emergency Department if you experience any worsening of your condition.   Thank you for allowing [name] to be a part of your care.

## 2024-01-15 ENCOUNTER — Encounter (HOSPITAL_BASED_OUTPATIENT_CLINIC_OR_DEPARTMENT_OTHER): Payer: Self-pay

## 2024-01-15 ENCOUNTER — Emergency Department (HOSPITAL_BASED_OUTPATIENT_CLINIC_OR_DEPARTMENT_OTHER)
Admission: EM | Admit: 2024-01-15 | Discharge: 2024-01-15 | Disposition: A | Discharge status: Home / Self Care | Attending: Emergency Medicine | Admitting: Emergency Medicine

## 2024-01-15 ENCOUNTER — Other Ambulatory Visit: Payer: Self-pay

## 2024-01-15 DIAGNOSIS — R112 Nausea with vomiting, unspecified: Secondary | ICD-10-CM | POA: Insufficient documentation

## 2024-01-15 DIAGNOSIS — E876 Hypokalemia: Secondary | ICD-10-CM | POA: Insufficient documentation

## 2024-01-15 LAB — URINALYSIS, ROUTINE W REFLEX MICROSCOPIC
Bacteria, UA: NONE SEEN
Bilirubin Urine: NEGATIVE
Glucose, UA: NEGATIVE mg/dL
Ketones, ur: 40 mg/dL — AB
Leukocytes,Ua: NEGATIVE
Nitrite: NEGATIVE
Protein, ur: 30 mg/dL — AB
Specific Gravity, Urine: 1.022 (ref 1.005–1.030)
pH: 7 (ref 5.0–8.0)

## 2024-01-15 LAB — CBC
HCT: 32.2 % — ABNORMAL LOW (ref 36.0–46.0)
Hemoglobin: 9.1 g/dL — ABNORMAL LOW (ref 12.0–15.0)
MCH: 16.8 pg — ABNORMAL LOW (ref 26.0–34.0)
MCHC: 28.3 g/dL — ABNORMAL LOW (ref 30.0–36.0)
MCV: 59.5 fL — ABNORMAL LOW (ref 80.0–100.0)
Platelets: 490 10*3/uL — ABNORMAL HIGH (ref 150–400)
RBC: 5.41 MIL/uL — ABNORMAL HIGH (ref 3.87–5.11)
RDW: 19 % — ABNORMAL HIGH (ref 11.5–15.5)
WBC: 9.2 10*3/uL (ref 4.0–10.5)
nRBC: 0 % (ref 0.0–0.2)

## 2024-01-15 LAB — COMPREHENSIVE METABOLIC PANEL WITH GFR
ALT: 19 U/L (ref 0–44)
AST: 36 U/L (ref 15–41)
Albumin: 4.7 g/dL (ref 3.5–5.0)
Alkaline Phosphatase: 55 U/L (ref 38–126)
Anion gap: 17 — ABNORMAL HIGH (ref 5–15)
BUN: 13 mg/dL (ref 6–20)
CO2: 29 mmol/L (ref 22–32)
Calcium: 10.2 mg/dL (ref 8.9–10.3)
Chloride: 88 mmol/L — ABNORMAL LOW (ref 98–111)
Creatinine, Ser: 0.83 mg/dL (ref 0.44–1.00)
GFR, Estimated: >60 mL/min (ref >60)
Glucose, Bld: 161 mg/dL — ABNORMAL HIGH (ref 70–99)
Potassium: 2.8 mmol/L — ABNORMAL LOW (ref 3.5–5.1)
Sodium: 134 mmol/L — ABNORMAL LOW (ref 135–145)
Total Bilirubin: 0.5 mg/dL (ref 0.0–1.2)
Total Protein: 8.9 g/dL — ABNORMAL HIGH (ref 6.5–8.1)

## 2024-01-15 LAB — LIPASE, BLOOD: Lipase: 19 U/L (ref 11–51)

## 2024-01-15 LAB — MAGNESIUM: Magnesium: 2.3 mg/dL (ref 1.7–2.4)

## 2024-01-15 LAB — PREGNANCY, URINE: Preg Test, Ur: NEGATIVE

## 2024-01-15 MED ORDER — ONDANSETRON 4 MG PO TBDP: 4.0000 mg | ORAL_TABLET | Freq: Three times a day (TID) | ORAL | 0 refills | Status: DC | PRN

## 2024-01-15 MED ORDER — POTASSIUM CHLORIDE 10 MEQ/100ML IV SOLN
10.0000 meq | Freq: Once | INTRAVENOUS | Status: AC
  Administered 2024-01-15: 10 meq via INTRAVENOUS
  Filled 2024-01-15: qty 100

## 2024-01-15 MED ORDER — LACTATED RINGERS IV BOLUS
1000.0000 mL | Freq: Once | INTRAVENOUS | Status: AC
  Administered 2024-01-15: 1000 mL via INTRAVENOUS

## 2024-01-15 MED ORDER — MAGNESIUM SULFATE 2 GM/50ML IV SOLN
2.0000 g | Freq: Once | INTRAVENOUS | Status: AC
  Administered 2024-01-15: 2 g via INTRAVENOUS
  Filled 2024-01-15: qty 50

## 2024-01-15 MED ORDER — DROPERIDOL 2.5 MG/ML IJ SOLN
2.5000 mg | Freq: Once | INTRAMUSCULAR | Status: AC
Start: 2024-01-15 — End: 2024-01-15
  Administered 2024-01-15: 2.5 mg via INTRAVENOUS
  Filled 2024-01-15: qty 2

## 2024-01-15 MED ORDER — PANTOPRAZOLE SODIUM 40 MG IV SOLR
40.0000 mg | Freq: Once | INTRAVENOUS | Status: AC
  Administered 2024-01-15: 40 mg via INTRAVENOUS
  Filled 2024-01-15: qty 10

## 2024-01-15 MED ORDER — POTASSIUM CHLORIDE CRYS ER 20 MEQ PO TBCR: 20.0000 meq | EXTENDED_RELEASE_TABLET | Freq: Two times a day (BID) | ORAL | 0 refills | Status: DC

## 2024-01-15 NOTE — ED Provider Notes (Signed)
 [place] EMERGENCY DEPARTMENT AT [facility] Provider Note   CSN: [id number] Arrival date & time: 01/15/24  7846     History  Chief Complaint  Patient presents with   Vomiting    Lynn Wilcox is a 43 y.o. female.  43 yo F here with emesis. Has h/o same round the time of her menstrual cycle. This episode has been ongoing for a few days. Tolerated fluids some today but still vomiting solids. States she knows MJ causes her emesis and hasn't had any before or during this episode. Crampy abdominal pain present. No diarrhea. No fevers. No urinary symptoms.         Home Medications Prior to Admission medications   Medication Sig Start Date End Date Taking? Authorizing Provider  cetirizine (ZYRTEC) 10 MG tablet TAKE 1 TABLET BY MOUTH DAILY 07/07/20   [name], DO  famotidine (PEPCID) 40 MG tablet Take 1 tablet (40 mg total) by mouth daily. 05/03/23   Erick Alley, DO  ferrous sulfate 324 (65 Fe) MG TBEC Take 1 tablet (325 mg total) by mouth every other day. 05/03/23   Erick Alley, DO  ibuprofen (ADVIL,MOTRIN) 600 MG tablet Take 1 tablet (600 mg total) by mouth every 6 (six) hours as needed for mild pain, moderate pain or cramping. 08/24/13   [name], CNM  ondansetron (ZOFRAN-ODT) 8 MG disintegrating tablet Take 1 tablet (8 mg total) by mouth every 8 (eight) hours as needed for nausea. 10/29/23   Derwood Kaplan, MD  potassium chloride SA (KLOR-CON M) 20 MEQ tablet Take 1 tablet (20 mEq total) by mouth 2 (two) times daily. 10/29/23   Derwood Kaplan, MD  PROAIR HFA 108 (90 Base) MCG/ACT inhaler INHALE 2 PUFFS INTO THE LUNGS EVERY 6 HOURS AS NEEDED FOR WHEEZING 10/26/22   Lilland, Alana, DO  promethazine (PHENERGAN) 25 MG tablet Take 1 tablet (25 mg total) by mouth every 6 (six) hours as needed for nausea or vomiting. 11/26/23   Sabas Sous, MD  valACYclovir (VALTREX) 1000 MG tablet Take 1,000 mg by mouth 3 (three) times daily. Patient not taking: Reported on  05/03/2023 05/01/22   [provider]      Allergies    Chocolate, Coffee bean extract [coffea arabica], Soy allergy (obsolete), Tea, and Yeast-derived drug products    Review of Systems   Review of Systems  Physical Exam Updated Vital Signs BP (!) 154/95   Pulse (!) 117   Temp 97.6 F (36.4 C)   Resp (!) 9   SpO2 97%  Physical Exam Vitals and nursing note reviewed.  Constitutional:      Appearance: She is well-developed.     Comments: Actively vomiting during history/physical  HENT:     Head: Normocephalic and atraumatic.  Cardiovascular:     Rate and Rhythm: Normal rate and regular rhythm.  Pulmonary:     Effort: No respiratory distress.     Breath sounds: No stridor.  Abdominal:     General: There is no distension.     Tenderness: There is no abdominal tenderness.  Musculoskeletal:     Cervical back: Normal range of motion.  Skin:    General: Skin is warm and dry.  Neurological:     General: No focal deficit present.     Mental Status: She is alert.     ED Results / Procedures / Treatments   Labs (all labs ordered are listed, but only abnormal results are displayed) Labs Reviewed  COMPREHENSIVE METABOLIC PANEL WITH  GFR - Abnormal; Notable for the following components:      Result Value   Sodium 134 (*)    Potassium 2.8 (*)    Chloride 88 (*)    Glucose, Bld 161 (*)    Total Protein 8.9 (*)    Anion gap 17 (*)    All other components within normal limits  CBC - Abnormal; Notable for the following components:   RBC 5.41 (*)    Hemoglobin 9.1 (*)    HCT 32.2 (*)    MCV 59.5 (*)    MCH 16.8 (*)    MCHC 28.3 (*)    RDW 19.0 (*)    Platelets 490 (*)    All other components within normal limits  LIPASE, BLOOD  MAGNESIUM  URINALYSIS, ROUTINE W REFLEX MICROSCOPIC  PREGNANCY, URINE    EKG None  Radiology No results found.  Procedures Procedures    Medications Ordered in ED Medications  potassium chloride 10 mEq in 100 mL IVPB (10 mEq  Intravenous New Bag/Given 01/15/24 0612)  magnesium sulfate IVPB 2 g 50 mL (2 g Intravenous New Bag/Given 01/15/24 1610)  lactated ringers bolus 1,000 mL (1,000 mLs Intravenous New Bag/Given 01/15/24 0601)  droperidol (INAPSINE) 2.5 MG/ML injection 2.5 mg (2.5 mg Intravenous Given 01/15/24 0602)    ED Course/ Medical Decision Making/ A&P                                 Medical Decision Making Amount and/or Complexity of Data Reviewed Labs: ordered.  Risk Prescription drug management.  Hypokalemia. Emesis. Will treat both. No indication for imaging now it seems to be part of her CVS.   Emesis seems imrproved but sleepy. No PO challenge yet. Care transferred pending K repletion, fluids and reeval for disposition.    Final Clinical Impression(s) / ED Diagnoses Final diagnoses:  None    Rx / DC Orders ED Discharge Orders     None         Colonel Krauser, Barbara Cower, MD 01/15/24 2308

## 2024-01-15 NOTE — ED Notes (Signed)
 Pt given discharge instructions and reviewed prescriptions. Opportunities given for questions. Pt verbalizes understanding. PIV removed x1. Jillyn Hidden, RN

## 2024-01-15 NOTE — Discharge Instructions (Signed)
 Follow-up with your primary care doctor.  I recommend that you discuss maybe birth control with them.  I prescribed you Zofran as needed for further nausea and vomiting.

## 2024-01-15 NOTE — ED Provider Notes (Signed)
 Patient feeling much better after IV fluids and antiemetics.  Suspicion may be hyperemesis from menstrual cycle may be food poisoning.  Tachycardia is improved.  She has been able to tolerate p.o.  Will prescribe Zofran.  Recommend follow-up with primary care doctor.  No abdominal pain on my evaluation.  Labs unremarkable otherwise.  Potassium has been repleted.  Discharged in good condition.  Understands return precautions.   Virgina Norfolk, DO 01/15/24 [phone]

## 2024-01-15 NOTE — ED Triage Notes (Signed)
 Pt states that she has been having n/v and upper abd pain since Friday with diarrhea

## 2024-01-21 ENCOUNTER — Inpatient Hospital Stay: Admitting: Student

## 2024-01-21 NOTE — Progress Notes (Deleted)
  SUBJECTIVE:   CHIEF COMPLAINT / HPI:   Presents for hospital follow-up, she went to the ED for hyperemesis and received IV fluids and antiemetics was able to be discharged appropriately.  She was last seen for her nausea and vomiting in August 2024.  PERTINENT  PMH / PSH: Asthma, GERD, anemia  OBJECTIVE:  There were no vitals taken for this visit. ***  ASSESSMENT/PLAN:   Assessment & Plan  No follow-ups on file. Veronia Goon, DO 01/21/2024, 8:02 AM PGY-***, [facility] {    This will disappear when note is signed, click to select method of visit    :1}

## 2024-01-22 ENCOUNTER — Ambulatory Visit

## 2024-01-22 VITALS — BP 120/78 | HR 98 | Ht 62.0 in | Wt 251.0 lb

## 2024-01-22 DIAGNOSIS — D649 Anemia, unspecified: Secondary | ICD-10-CM

## 2024-01-22 DIAGNOSIS — R1110 Vomiting, unspecified: Secondary | ICD-10-CM

## 2024-01-22 DIAGNOSIS — N943 Premenstrual tension syndrome: Secondary | ICD-10-CM

## 2024-01-22 NOTE — Patient Instructions (Addendum)
 It was great to see you today! Thank you for choosing [facility] for your primary care.  Today we addressed: We will send you to the OBGYN and gastroenterologist for further assessment  Take antinausea medications a couple of days leading of to menstrual cycle  Will check labs today 1 week follow up for pap smear  If you haven't already, sign up for My Chart to have easy access to your labs results, and communication with your primary care physician.   Please arrive 15 minutes before your appointment to ensure smooth check in process.  We appreciate your efforts in making this happen.  Thank you for allowing me to participate in your care, Ernestina Headland, MD 01/22/2024, 10:14 AM PGY-3, [facility]

## 2024-01-22 NOTE — Assessment & Plan Note (Signed)
 Anemia with hemoglobin at 9 g/dL, likely due to heavy menstrual bleeding. Managing with iron supplementation. - Order CBC to evaluate current hemoglobin levels. - Continue iron supplementation.

## 2024-01-22 NOTE — Progress Notes (Signed)
  SUBJECTIVE:   CHIEF COMPLAINT / HPI:   ED Follow Up:  Presents for ED follow-up, she went to the ED for hyperemesis and received IV fluids and antiemetics was able to be discharged appropriately.  She was last seen for her nausea and vomiting in August 2024. ED replete K and PO challenged successfully prior to disposition home  The patient, with a history of esophagitis and intestinal metaplasia, presents with recurrent episodes of uncontrollable vomiting associated with menstruation.  These episodes have been occurring for approximately a year and have recently become more severe, necessitating frequent visits to the emergency department for fluid resuscitation and potassium repletion. The vomiting is so severe that it cannot be managed at home and only resolves with hospital intervention.  The onset of these episodes was initially associated with certain foods, but over the past six months, she has become consistently linked with the patient's menstrual cycle.  The first two days of menstruation are particularly severe, with intense nausea and vomiting. The patient also reports that her periods have become heavier over the past few months, with the passage of large blood clots.  The patient has a history of depression following the death of her mother, during which time she experienced a significant decrease in appetite.  She has been taking iron and potassium supplements as needed.  The patient has not been on any form of birth control due to concerns about side effects and has a history of migraines in her youth. She has no known history of blood pressure problems or blood clots.  Per previous GI EGD 05/31/20 showed: - LA Grade A esophagitis.  - Diffuse mildly erythematous mucosa without bleeding was found in the gastric antrum. - Patchy mildly erythematous mucosa was found in the duodenal bulb.  PERTINENT  PMH / PSH: Asthma, GERD, anemia  OBJECTIVE:  BP 120/78   Pulse 98   Ht 5\' 2"   (1.575 m)   Wt 251 lb (113.9 kg)   LMP 01/12/2024   SpO2 99%   BMI 45.91 kg/m  General: Alert and oriented in no apparent distress Heart: Regular rate and rhythm with no murmurs appreciated Lungs: CTA bilaterally, no wheezing Abdomen: Bowel sounds present, no abdominal pain Skin: Warm and dry Extremities: No lower extremity edema   ASSESSMENT/PLAN:   Assessment & Plan Premenstrual symptom Severe nausea and vomiting during menstruation, requiring ED visits for IV fluids and antiemetics. Suspected menstrual cycle-induced condition, possibly PMS or PMDD??. Esophagitis and intestinal metaplasia history may contribute. Discussed OCPs; patient hesitant. Suggested prophylactic Zofran. Patient defer pap until next visit in 1-2 weeks although was offered today - Refer to gastroenterology for evaluation of gastrointestinal symptoms with h/o intestinal metaplasia. - Refer to gynecology for evaluation of menstrual symptoms and discussion of OCPs. - Zofran prophylactically before menstruation. - Order CMP  - Order CBC  Anemia, unspecified type Anemia with hemoglobin at 9 g/dL, likely due to heavy menstrual bleeding. Managing with iron supplementation. - Order CBC to evaluate current hemoglobin levels. - Continue iron supplementation.  No follow-ups on file. Ernestina Headland, MD 01/22/2024, 10:14 AM PGY-3, [facility]

## 2024-01-22 NOTE — Progress Notes (Deleted)
  SUBJECTIVE:   CHIEF COMPLAINT / HPI:   Presents for hospital follow-up, she went to the ED for hyperemesis and received IV fluids and antiemetics was able to be discharged appropriately.  She was last seen for her nausea and vomiting in August 2024.  PERTINENT  PMH / PSH: Asthma, GERD, anemia  OBJECTIVE:  BP 120/78   Pulse 98   Ht 5\' 2"  (1.575 m)   Wt 251 lb (113.9 kg)   LMP 01/12/2024   SpO2 99%   BMI 45.91 kg/m  General: Alert and oriented in no apparent distress Heart: Regular rate and rhythm with no murmurs appreciated Lungs: CTA bilaterally, no wheezing Abdomen: Bowel sounds present, no abdominal pain Skin: Warm and dry Extremities: No lower extremity edema   ASSESSMENT/PLAN:   Assessment & Plan  No follow-ups on file. Ernestina Headland, MD 01/22/2024, 10:00 AM PGY-3, [facility]

## 2024-01-23 LAB — CBC WITH DIFFERENTIAL/PLATELET
Basophils Absolute: 0 10*3/uL (ref 0.0–0.2)
Basos: 1 %
EOS (ABSOLUTE): 0.2 10*3/uL (ref 0.0–0.4)
Eos: 2 %
Hematocrit: 26.5 % — ABNORMAL LOW (ref 34.0–46.6)
Hemoglobin: 6.6 g/dL — CL (ref 11.1–15.9)
Immature Grans (Abs): 0 10*3/uL (ref 0.0–0.1)
Immature Granulocytes: 0 %
Lymphocytes Absolute: 2.2 10*3/uL (ref 0.7–3.1)
Lymphs: 29 %
MCH: 16.7 pg — ABNORMAL LOW (ref 26.6–33.0)
MCHC: 24.9 g/dL — CL (ref 31.5–35.7)
MCV: 67 fL — ABNORMAL LOW (ref 79–97)
Monocytes Absolute: 0.7 10*3/uL (ref 0.1–0.9)
Monocytes: 9 %
Neutrophils Absolute: 4.5 10*3/uL (ref 1.4–7.0)
Neutrophils: 59 %
Platelets: 412 10*3/uL (ref 150–450)
RBC: 3.95 x10E6/uL (ref 3.77–5.28)
RDW: 17.3 % — ABNORMAL HIGH (ref 11.7–15.4)
WBC: 7.6 10*3/uL (ref 3.4–10.8)

## 2024-01-23 LAB — COMPREHENSIVE METABOLIC PANEL WITH GFR
ALT: 31 IU/L (ref 0–32)
AST: 26 IU/L (ref 0–40)
Albumin: 3.6 g/dL — ABNORMAL LOW (ref 3.9–4.9)
Alkaline Phosphatase: 61 IU/L (ref 44–121)
BUN/Creatinine Ratio: 8 — ABNORMAL LOW (ref 9–23)
BUN: 6 mg/dL (ref 6–24)
Bilirubin Total: <0.2 mg/dL (ref 0.0–1.2)
CO2: 22 mmol/L (ref 20–29)
Calcium: 8.5 mg/dL — ABNORMAL LOW (ref 8.7–10.2)
Chloride: 103 mmol/L (ref 96–106)
Creatinine, Ser: 0.72 mg/dL (ref 0.57–1.00)
Globulin, Total: 2.8 g/dL (ref 1.5–4.5)
Glucose: 103 mg/dL — ABNORMAL HIGH (ref 70–99)
Potassium: 4.5 mmol/L (ref 3.5–5.2)
Sodium: 138 mmol/L (ref 134–144)
Total Protein: 6.4 g/dL (ref 6.0–8.5)
eGFR: 108 mL/min/{1.73_m2} (ref >59)

## 2024-01-23 LAB — IRON,TIBC AND FERRITIN PANEL
Ferritin: 4 ng/mL — ABNORMAL LOW (ref 15–150)
Iron Saturation: 2 % — CL (ref 15–55)
Iron: 8 ug/dL — CL (ref 27–159)
Total Iron Binding Capacity: 447 ug/dL (ref 250–450)
UIBC: 439 ug/dL — ABNORMAL HIGH (ref 131–425)

## 2024-01-23 LAB — MAGNESIUM: Magnesium: 1.8 mg/dL (ref 1.6–2.3)

## 2024-01-23 NOTE — Progress Notes (Signed)
 Hgb 9.1>6.6. Unknown cause but heavy menstrual cycles. Spoke to patient via phone. Unable to schedule an appt as no staff has appt tomorrow after review of the schedule. Given her acute drop and currently still spotting with no upcoming appt available in our clinic, will have her go to ED for recheck and assessment if she needs a unit of blood.   Called and verified DOB. Discussed this recommendation with the patient. Told her if unable to go to ED, needs to call clinic early tomorrow to try to schedule an appt ASAP. Patient verbalized understanding.

## 2024-01-28 ENCOUNTER — Telehealth: Payer: Self-pay

## 2024-01-28 NOTE — Telephone Encounter (Signed)
 Contacted the patient but she did not answer. I left her a voice mail informing her to give us  a call back so we can schedule her for her follow up asap.

## 2024-02-05 ENCOUNTER — Telehealth: Payer: Self-pay | Admitting: Family Medicine

## 2024-02-05 ENCOUNTER — Telehealth: Payer: Self-pay

## 2024-02-05 ENCOUNTER — Ambulatory Visit: Admitting: Student

## 2024-02-05 VITALS — BP 130/75 | HR 74 | Ht 62.0 in | Wt 231.2 lb

## 2024-02-05 DIAGNOSIS — D500 Iron deficiency anemia secondary to blood loss (chronic): Secondary | ICD-10-CM

## 2024-02-05 LAB — POCT HEMOGLOBIN: Hemoglobin: 7 g/dL — AB (ref 11–14.6)

## 2024-02-05 NOTE — Patient Instructions (Addendum)
 It was great to see you today! Thank you for choosing [facility] for your primary care.  Today we addressed: I have placed an order for IV iron.  We discussed ED precautions.  Please make an appointment for 8:30 AM on Friday and access to care with me.  We may do a Pap that day and a procedure for hormonal birth control based on your interest after reviewing the information provided.  If you haven't already, sign up for My Chart to have easy access to your labs results, and communication with your primary care physician.  Return in about 3 days (around 02/08/2024). Please arrive 15 minutes before your appointment to ensure smooth check in process.  We appreciate your efforts in making this happen.  Thank you for allowing me to participate in your care, Veronia Goon, DO 02/05/2024, 10:44 AM PGY-3, [facility]

## 2024-02-05 NOTE — Assessment & Plan Note (Signed)
 Hemoglobin 7.0.  She will likely have menstrual period next week.  She is significantly iron deficient and does not seem to be benefiting from oral iron.  Referral for IV iron placed.  Discussed contraceptive management, specifically progesterone therapy.  Would be a good candidate for Depo, Nexplanon, Mirena IUD.  She will return Friday morning for Pap and potentially contraceptive management.  ED precautions discussed.

## 2024-02-05 NOTE — Progress Notes (Signed)
  SUBJECTIVE:   CHIEF COMPLAINT / HPI:   Anemia: experiences significant fatigue and shortness of breath, especially during physical activities like climbing stairs. These symptoms have been present for the last couple of weeks and have worsened recently. Her hemoglobin level was previously 6.6 and is currently 7. Her last menstrual period began on January 12, 2024. She takes iron supplements every other day to avoid constipation but missed a couple of doses last week. There is no bleeding outside of her menstrual periods, and she has not been sexually active in the past month.  She has fibroids and heavy menstrual periods, causing vomiting during the first two days of her cycle. She has not seen an OB-GYN as previously referred due to missing the appointment. She is not currently on any birth control or hormonal therapy. Her symptoms significantly impact her job, as her employer is not supportive of her needing time off during her menstrual cycle. Her first two days of menstruation are particularly debilitating, leading to vomiting and requiring several days to recover.  PERTINENT  PMH / PSH: Asthma, GERD, anemia   OBJECTIVE:  BP 130/75   Pulse 74   Ht 5\' 2"  (1.575 m)   Wt 231 lb 3.2 oz (104.9 kg)   LMP 01/12/2024   SpO2 100%   BMI 42.29 kg/m  General: Well-appearing, NAD CV: RRR, no murmurs appreciable Pulm: Normal WOB  ASSESSMENT/PLAN:   Assessment & Plan Iron deficiency anemia due to chronic blood loss Hemoglobin 7.0.  She will likely have menstrual period next week.  She is significantly iron deficient and does not seem to be benefiting from oral iron.  Referral for IV iron placed.  Discussed contraceptive management, specifically progesterone therapy.  Would be a good candidate for Depo, Nexplanon, Mirena IUD.  She will return Friday morning for Pap and potentially contraceptive management.  ED precautions discussed. Return in about 3 days (around 02/08/2024). Veronia Goon,  DO 02/05/2024, 11:57 AM PGY-3, [facility]

## 2024-02-05 NOTE — Telephone Encounter (Signed)
 Patient referred to infusion pharmacy team for ambulatory infusion of IV iron.  Insurance - [place] Medicaid Prepaid  Dx code - D64.9  IV Iron Therapy - Feraheme 510 mg IV x 2  Infusion appointments - Scheduling team will schedule patient as soon as possible.    Chanc Kervin D. Na Waldrip, PharmD

## 2024-02-05 NOTE — Telephone Encounter (Signed)
 Dr. Grandville Lax, patient will be scheduled as soon as possible.  Auth Submission: NO AUTH NEEDED Site of care: Site of care: [facility] Payer: [employer] medicaid Medication & CPT/J Code(s) submitted: Feraheme (ferumoxytol) [id number] Route of submission (phone, fax, portal): portal Phone # Fax # Auth type: Buy/Bill PB Units/visits requested: 510mg  x 2 doses Reference number:  Approval from: 02/05/24 to 07/07/24

## 2024-02-08 ENCOUNTER — Ambulatory Visit (INDEPENDENT_AMBULATORY_CARE_PROVIDER_SITE_OTHER): Admitting: Student

## 2024-02-08 ENCOUNTER — Other Ambulatory Visit (HOSPITAL_COMMUNITY)
Admission: RE | Admit: 2024-02-08 | Discharge: 2024-02-08 | Disposition: A | Discharge status: Home / Self Care | Source: Ambulatory Visit | Attending: Family Medicine | Admitting: Family Medicine

## 2024-02-08 VITALS — BP 116/70 | HR 77 | Wt 235.0 lb

## 2024-02-08 DIAGNOSIS — Z975 Presence of (intrauterine) contraceptive device: Secondary | ICD-10-CM | POA: Insufficient documentation

## 2024-02-08 DIAGNOSIS — Z3009 Encounter for other general counseling and advice on contraception: Secondary | ICD-10-CM | POA: Insufficient documentation

## 2024-02-08 DIAGNOSIS — D509 Iron deficiency anemia, unspecified: Secondary | ICD-10-CM

## 2024-02-08 DIAGNOSIS — Z0000 Encounter for general adult medical examination without abnormal findings: Secondary | ICD-10-CM

## 2024-02-08 DIAGNOSIS — Z30017 Encounter for initial prescription of implantable subdermal contraceptive: Secondary | ICD-10-CM

## 2024-02-08 LAB — POCT URINE PREGNANCY: Preg Test, Ur: NEGATIVE

## 2024-02-08 MED ORDER — ETONOGESTREL 68 MG ~~LOC~~ IMPL
68.0000 mg | DRUG_IMPLANT | Freq: Once | SUBCUTANEOUS | Status: AC
Start: 2024-02-08 — End: 2024-02-08
  Administered 2024-02-08: 68 mg via SUBCUTANEOUS

## 2024-02-08 NOTE — Assessment & Plan Note (Addendum)
 Nexplanon placement today, see procedure note.  She is established for iron infusion x 2.  Advise returning in 1 month for follow-up of Nexplanon placement and recheck hemoglobin as she should begin to respond to IV iron at that time.  She is established with OB/GYN and I have advised discussing with them should she consider hysterectomy in the future as she does not intend on having more children.

## 2024-02-08 NOTE — Progress Notes (Addendum)
  SUBJECTIVE:   CHIEF COMPLAINT / HPI:   Iron deficiency anemia: Presents today for Pap and decision on contraceptive management regarding her anemia and fibroids.  She has decided on Nexplanon although it does have some further questions.  She is established with OB/GYN and may consider hysterectomy in the future.  PERTINENT  PMH / PSH: Asthma, GERD, anemia  OBJECTIVE:  BP 116/70   Pulse 77   Wt 235 lb (106.6 kg)   LMP 01/12/2024   BMI 42.98 kg/m  Pelvic exam: normal external genitalia, vulva, vagina, cervix, uterus and adnexa, VULVA: normal appearing vulva with no masses, tenderness or lesions, VAGINA: normal appearing vagina with normal color and discharge, no lesions, CERVIX: normal appearing cervix without discharge or lesions, exam chaperoned by Virgina Grills, CMA.   ASSESSMENT/PLAN:   Assessment & Plan Iron deficiency anemia, unspecified iron deficiency anemia type Nexplanon placement today, see procedure note.  She is established for iron infusion x 2.  Advise returning in 1 month for follow-up of Nexplanon placement and recheck hemoglobin as she should begin to respond to IV iron at that time.  She is established with OB/GYN and I have advised discussing with them should she consider hysterectomy in the future as she does not intend on having more children. Nexplanon insertion Discussed various method of birth control, reviewed side effects and confirmed Nexplanon placement today.  See procedure note below.  Patient identified, informed consent performed, consent signed.   Patient does understand that irregular bleeding is a very common side effect of this medication. She was advised to have backup contraception for one week after placement. Pregnancy test in clinic today was negative.  Appropriate time out taken.  Patient's left arm was prepped and draped in the usual sterile fashion. The insertion area was measured and marked.  Patient was prepped with betadine and alcohol swab  and then injected with 3 ml of 1% lidocaine . Nexplanon removed from packaging,  device confirmed present within needle, then inserted full length of needle and withdrawn per handbook instructions. Nexplanon was able to palpated in the patient's arm; patient palpated the insert herself. There was minimal blood loss.  Patient insertion site covered with guaze and a pressure bandage to reduce any bruising.  The patient tolerated the procedure well and was given post procedure instructions.  Routine health maintenance Pap performed today with routine STD testing.  Return in about 1 month (around 03/10/2024) for Anemia follow-up.  Veronia Goon, DO 02/08/2024, 9:26 AM PGY-3, [facility]

## 2024-02-08 NOTE — Patient Instructions (Addendum)
 It was great to see you today! Thank you for choosing [facility] for your primary care.  Today we addressed: Your pregnancy test was negative.  We performed a Pap today.  I will let you know of the results.  We placed a Nexplanon in your left arm.  Irregular bleeding is the most common side effect which is most prevalent in the 3 to 68-month time.  You may take Tylenol  or ibuprofen  at home afterwards.  Please look out for any signs of infection which is redness around the site that is spreading.  Please do not engage in sexual intercourse for 1 week or if you do so use another method of protection as this will not serve contraceptive purposes until approximately 1 week after placement.  If you haven't already, sign up for My Chart to have easy access to your labs results, and communication with your primary care physician.  Return in about 1 month (around 03/10/2024) for Anemia follow-up. Please arrive 15 minutes before your appointment to ensure smooth check in process.  We appreciate your efforts in making this happen.  Thank you for allowing me to participate in your care, Veronia Goon, DO 02/08/2024, 9:06 AM PGY-3, [facility]

## 2024-02-12 LAB — CYTOLOGY - PAP
Chlamydia: NEGATIVE
Comment: NEGATIVE
Comment: NEGATIVE
Comment: NEGATIVE
Comment: NORMAL
High risk HPV: POSITIVE — AB
Neisseria Gonorrhea: NEGATIVE
Trichomonas: NEGATIVE

## 2024-02-14 ENCOUNTER — Telehealth: Payer: Self-pay | Admitting: Student

## 2024-02-14 NOTE — Telephone Encounter (Signed)
 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  Notified patient these results can be concerning or nothing at all however recommended next step is colposcopy.  Advised I will have staff call her to set up colposcopy appointment in clinic.  She did not have any further questions after discussion.

## 2024-02-15 ENCOUNTER — Ambulatory Visit (INDEPENDENT_AMBULATORY_CARE_PROVIDER_SITE_OTHER)

## 2024-02-15 ENCOUNTER — Encounter: Payer: Self-pay | Admitting: Gastroenterology

## 2024-02-15 VITALS — BP 113/76 | HR 40 | Temp 98.1°F | Resp 18 | Ht 62.0 in | Wt 238.6 lb

## 2024-02-15 DIAGNOSIS — D509 Iron deficiency anemia, unspecified: Secondary | ICD-10-CM

## 2024-02-15 DIAGNOSIS — D649 Anemia, unspecified: Secondary | ICD-10-CM

## 2024-02-15 MED ORDER — SODIUM CHLORIDE 0.9 % IV SOLN
510.0000 mg | Freq: Once | INTRAVENOUS | Status: AC
  Administered 2024-02-15: 510 mg via INTRAVENOUS
  Filled 2024-02-15: qty 17

## 2024-02-15 NOTE — Progress Notes (Signed)
 Diagnosis: Iron Deficiency Anemia  Provider:  Praveen Mannam MD  Procedure: IV Infusion  IV Type: Peripheral, IV Location: R Antecubital  Feraheme (Ferumoxytol), Dose: 510 mg  Infusion Start Time: 0839  Infusion Stop Time: 0902  Post Infusion IV Care: Observation period completed and Peripheral IV Discontinued  Discharge: Condition: Good, Destination: Home . AVS Declined  Performed by:  Lauran Pollard, LPN

## 2024-02-22 ENCOUNTER — Ambulatory Visit: Admitting: *Deleted

## 2024-02-22 VITALS — BP 124/89 | HR 79 | Temp 97.9°F | Resp 16 | Ht 63.0 in | Wt 242.8 lb

## 2024-02-22 DIAGNOSIS — D649 Anemia, unspecified: Secondary | ICD-10-CM

## 2024-02-22 MED ORDER — SODIUM CHLORIDE 0.9 % IV SOLN
510.0000 mg | Freq: Once | INTRAVENOUS | Status: AC
  Administered 2024-02-22: 510 mg via INTRAVENOUS
  Filled 2024-02-22: qty 17

## 2024-02-22 NOTE — Progress Notes (Signed)
 Diagnosis: Iron Deficiency Anemia  Provider:  Mannam, Praveen MD  Procedure: IV Infusion  IV Type: Peripheral, IV Location: R Antecubital  Feraheme (Ferumoxytol), Dose: 510 mg  Infusion Start Time: 0845 am  Infusion Stop Time: 0915  Post Infusion IV Care: Observation period completed and Peripheral IV Discontinued  Discharge: Condition: Good, Destination: Home . AVS Provided  Performed by:  Mayme Spearman, RN

## 2024-02-27 ENCOUNTER — Encounter (HOSPITAL_BASED_OUTPATIENT_CLINIC_OR_DEPARTMENT_OTHER): Payer: Self-pay | Admitting: *Deleted

## 2024-02-27 ENCOUNTER — Other Ambulatory Visit (HOSPITAL_BASED_OUTPATIENT_CLINIC_OR_DEPARTMENT_OTHER): Payer: Self-pay

## 2024-02-27 ENCOUNTER — Emergency Department (HOSPITAL_BASED_OUTPATIENT_CLINIC_OR_DEPARTMENT_OTHER)
Admission: EM | Admit: 2024-02-27 | Discharge: 2024-02-27 | Disposition: A | Discharge status: Home / Self Care | Attending: Emergency Medicine | Admitting: Emergency Medicine

## 2024-02-27 ENCOUNTER — Emergency Department (HOSPITAL_BASED_OUTPATIENT_CLINIC_OR_DEPARTMENT_OTHER)

## 2024-02-27 ENCOUNTER — Other Ambulatory Visit: Payer: Self-pay

## 2024-02-27 DIAGNOSIS — D72829 Elevated white blood cell count, unspecified: Secondary | ICD-10-CM | POA: Diagnosis not present

## 2024-02-27 DIAGNOSIS — E860 Dehydration: Secondary | ICD-10-CM | POA: Diagnosis not present

## 2024-02-27 DIAGNOSIS — R109 Unspecified abdominal pain: Secondary | ICD-10-CM

## 2024-02-27 DIAGNOSIS — R1013 Epigastric pain: Secondary | ICD-10-CM | POA: Diagnosis not present

## 2024-02-27 DIAGNOSIS — R112 Nausea with vomiting, unspecified: Secondary | ICD-10-CM

## 2024-02-27 DIAGNOSIS — R1011 Right upper quadrant pain: Secondary | ICD-10-CM | POA: Diagnosis not present

## 2024-02-27 LAB — URINALYSIS, ROUTINE W REFLEX MICROSCOPIC
Bacteria, UA: NONE SEEN
Bilirubin Urine: NEGATIVE
Glucose, UA: NEGATIVE mg/dL
Ketones, ur: >80 mg/dL — AB
Nitrite: NEGATIVE
Protein, ur: >300 mg/dL — AB
RBC / HPF: >50 RBC/hpf (ref 0–5)
Specific Gravity, Urine: 1.04 — ABNORMAL HIGH (ref 1.005–1.030)
pH: 6.5 (ref 5.0–8.0)

## 2024-02-27 LAB — PREGNANCY, URINE: Preg Test, Ur: NEGATIVE

## 2024-02-27 LAB — COMPREHENSIVE METABOLIC PANEL WITH GFR
ALT: 14 U/L (ref 0–44)
AST: 26 U/L (ref 15–41)
Albumin: 4.5 g/dL (ref 3.5–5.0)
Alkaline Phosphatase: 62 U/L (ref 38–126)
Anion gap: 23 — ABNORMAL HIGH (ref 5–15)
BUN: 12 mg/dL (ref 6–20)
CO2: 16 mmol/L — ABNORMAL LOW (ref 22–32)
Calcium: 10.8 mg/dL — ABNORMAL HIGH (ref 8.9–10.3)
Chloride: 101 mmol/L (ref 98–111)
Creatinine, Ser: 0.87 mg/dL (ref 0.44–1.00)
GFR, Estimated: >60 mL/min (ref >60)
Glucose, Bld: 215 mg/dL — ABNORMAL HIGH (ref 70–99)
Potassium: 3.6 mmol/L (ref 3.5–5.1)
Sodium: 140 mmol/L (ref 135–145)
Total Bilirubin: 0.5 mg/dL (ref 0.0–1.2)
Total Protein: 9.1 g/dL — ABNORMAL HIGH (ref 6.5–8.1)

## 2024-02-27 LAB — CBC
HCT: 37.3 % (ref 36.0–46.0)
Hemoglobin: 11.1 g/dL — ABNORMAL LOW (ref 12.0–15.0)
MCH: 19.9 pg — ABNORMAL LOW (ref 26.0–34.0)
MCHC: 29.8 g/dL — ABNORMAL LOW (ref 30.0–36.0)
MCV: 66.8 fL — ABNORMAL LOW (ref 80.0–100.0)
Platelets: 342 10*3/uL (ref 150–400)
RBC: 5.58 MIL/uL — ABNORMAL HIGH (ref 3.87–5.11)
RDW: 33.1 % — ABNORMAL HIGH (ref 11.5–15.5)
WBC: 13.4 10*3/uL — ABNORMAL HIGH (ref 4.0–10.5)
nRBC: 0 % (ref 0.0–0.2)

## 2024-02-27 LAB — LACTIC ACID, PLASMA
Lactic Acid, Venous: 2.1 mmol/L (ref 0.5–1.9)
Lactic Acid, Venous: 1.5 mmol/L (ref 0.5–1.9)

## 2024-02-27 LAB — LIPASE, BLOOD: Lipase: 13 U/L (ref 11–51)

## 2024-02-27 MED ORDER — ALUM & MAG HYDROXIDE-SIMETH 200-200-20 MG/5ML PO SUSP
30.0000 mL | Freq: Once | ORAL | Status: AC
  Administered 2024-02-27: 30 mL via ORAL
  Filled 2024-02-27: qty 30

## 2024-02-27 MED ORDER — OXYCODONE-ACETAMINOPHEN 5-325 MG PO TABS
1.0000 | ORAL_TABLET | ORAL | 0 refills | Status: DC | PRN
  Filled 2024-02-27: qty 15, 3d supply, fill #0

## 2024-02-27 MED ORDER — ONDANSETRON HCL 4 MG/2ML IJ SOLN
4.0000 mg | Freq: Once | INTRAMUSCULAR | Status: AC | PRN
  Administered 2024-02-27: 4 mg via INTRAVENOUS
  Filled 2024-02-27: qty 2

## 2024-02-27 MED ORDER — IOHEXOL 350 MG/ML SOLN
100.0000 mL | Freq: Once | INTRAVENOUS | Status: AC | PRN
  Administered 2024-02-27: 100 mL via INTRAVENOUS

## 2024-02-27 MED ORDER — LIDOCAINE VISCOUS HCL 2 % MT SOLN
15.0000 mL | Freq: Once | OROMUCOSAL | Status: AC
  Administered 2024-02-27: 15 mL via ORAL
  Filled 2024-02-27: qty 15

## 2024-02-27 MED ORDER — MORPHINE SULFATE (PF) 4 MG/ML IV SOLN: 4.0000 mg | Freq: Once | INTRAVENOUS | Status: DC

## 2024-02-27 MED ORDER — SODIUM CHLORIDE 0.9 % IV BOLUS
1000.0000 mL | Freq: Once | INTRAVENOUS | Status: AC
  Administered 2024-02-27: 1000 mL via INTRAVENOUS

## 2024-02-27 MED ORDER — ONDANSETRON 4 MG PO TBDP
4.0000 mg | ORAL_TABLET | Freq: Three times a day (TID) | ORAL | 0 refills | Status: DC | PRN
  Filled 2024-02-27: qty 20, 7d supply, fill #0

## 2024-02-27 MED ORDER — ALUM & MAG HYDROXIDE-SIMETH 200-200-20 MG/5ML PO SUSP
10.0000 mL | Freq: Once | ORAL | Status: AC
  Administered 2024-02-27: 10 mL via ORAL
  Filled 2024-02-27: qty 30

## 2024-02-27 NOTE — ED Notes (Signed)
 Patient transported to Ultrasound

## 2024-02-27 NOTE — ED Provider Notes (Signed)
 [facility] AT [facility] Provider Note   CSN: [id number] Arrival date & time: 02/27/24  6962     History  Chief Complaint  Patient presents with   Abdominal Pain    Lynn Wilcox is a 43 y.o. female.  The history is provided by the patient and medical records. No language interpreter was used.  Abdominal Pain Pain location:  Epigastric and RUQ Pain quality: aching, cramping and dull   Pain radiates to:  Does not radiate Pain severity:  Severe Onset quality:  Gradual Duration:  3 days Timing:  Constant Progression:  Waxing and waning Chronicity:  Recurrent Context: not diet changes, not eating, not previous surgeries, not sick contacts and not trauma   Relieved by:  Nothing Worsened by:  Vomiting Ineffective treatments:  None tried Associated symptoms: fatigue, nausea, vaginal bleeding (on menstrual cycle) and vomiting   Associated symptoms: no chest pain, no chills, no constipation, no cough, no diarrhea, no dysuria, no fever, no shortness of breath and no vaginal discharge   Risk factors: has not had multiple surgeries        Home Medications Prior to Admission medications   Medication Sig Start Date End Date Taking? Authorizing Provider  cetirizine  (ZYRTEC ) 10 MG tablet TAKE 1 TABLET BY MOUTH DAILY 07/07/20   Mordechai April, DO  famotidine  (PEPCID ) 40 MG tablet Take 1 tablet (40 mg total) by mouth daily. 05/03/23   Glenn Lange, DO  ferrous sulfate  324 (65 Fe) MG TBEC Take 1 tablet (325 mg total) by mouth every other day. 05/03/23   Glenn Lange, DO  ibuprofen  (ADVIL ,MOTRIN ) 600 MG tablet Take 1 tablet (600 mg total) by mouth every 6 (six) hours as needed for mild pain, moderate pain or cramping. 08/24/13   Ferd Householder, CNM  ondansetron  (ZOFRAN -ODT) 4 MG disintegrating tablet Take 1 tablet (4 mg total) by mouth every 8 (eight) hours as needed. 01/15/24   Curatolo, Adam, DO  potassium chloride  SA (KLOR-CON  M) 20 MEQ tablet Take 1 tablet  (20 mEq total) by mouth 2 (two) times daily for 4 days. 01/15/24 01/19/24  Curatolo, Adam, DO  PROAIR  HFA 108 (90 Base) MCG/ACT inhaler INHALE 2 PUFFS INTO THE LUNGS EVERY 6 HOURS AS NEEDED FOR WHEEZING 10/26/22   Lilland, Alana, DO  promethazine  (PHENERGAN ) 25 MG tablet Take 1 tablet (25 mg total) by mouth every 6 (six) hours as needed for nausea or vomiting. 11/26/23   Edson Graces, MD      Allergies    Chocolate, Coffee bean extract [coffea arabica], Soy allergy (obsolete), Tea, and Yeast-derived drug products    Review of Systems   Review of Systems  Constitutional:  Positive for fatigue. Negative for chills, diaphoresis and fever.  HENT:  Negative for congestion.   Respiratory:  Negative for cough, chest tightness, shortness of breath and wheezing.   Cardiovascular:  Negative for chest pain and palpitations.  Gastrointestinal:  Positive for abdominal pain, nausea and vomiting. Negative for constipation and diarrhea.  Genitourinary:  Positive for vaginal bleeding (on menstrual cycle). Negative for dysuria, flank pain and vaginal discharge.  Musculoskeletal:  Negative for back pain, neck pain and neck stiffness.  Skin:  Negative for rash and wound.  Neurological:  Negative for headaches.  Psychiatric/Behavioral:  Negative for agitation and confusion.   All other systems reviewed and are negative.   Physical Exam Updated Vital Signs BP (!) 136/107   Pulse (!) 129   Temp 98.2 F (36.8 C) (Oral)  Resp 18   LMP 02/27/2024   SpO2 100%  Physical Exam Vitals and nursing note reviewed.  Constitutional:      General: She is not in acute distress.    Appearance: She is well-developed. She is not ill-appearing, toxic-appearing or diaphoretic.  HENT:     Head: Normocephalic and atraumatic.     Nose: No congestion.     Mouth/Throat:     Mouth: Mucous membranes are dry.     Pharynx: No oropharyngeal exudate or posterior oropharyngeal erythema.  Eyes:     Extraocular Movements:  Extraocular movements intact.     Conjunctiva/sclera: Conjunctivae normal.     Pupils: Pupils are equal, round, and reactive to light.  Cardiovascular:     Rate and Rhythm: Regular rhythm. Tachycardia present.     Heart sounds: No murmur heard. Pulmonary:     Effort: Pulmonary effort is normal. No respiratory distress.     Breath sounds: Normal breath sounds. No wheezing, rhonchi or rales.  Abdominal:     General: There is no distension.     Palpations: Abdomen is soft.     Tenderness: There is abdominal tenderness in the right upper quadrant and epigastric area. There is no right CVA tenderness, left CVA tenderness, guarding or rebound.  Musculoskeletal:        General: No swelling.     Cervical back: Neck supple.  Skin:    General: Skin is warm and dry.     Capillary Refill: Capillary refill takes less than 2 seconds.     Coloration: Skin is not pale.     Findings: No rash.  Neurological:     General: No focal deficit present.     Mental Status: She is alert.  Psychiatric:        Mood and Affect: Mood normal.     ED Results / Procedures / Treatments   Labs (all labs ordered are listed, but only abnormal results are displayed) Labs Reviewed  COMPREHENSIVE METABOLIC PANEL WITH GFR - Abnormal; Notable for the following components:      Result Value   CO2 16 (*)    Glucose, Bld 215 (*)    Calcium 10.8 (*)    Total Protein 9.1 (*)    Anion gap 23 (*)    All other components within normal limits  CBC - Abnormal; Notable for the following components:   WBC 13.4 (*)    RBC 5.58 (*)    Hemoglobin 11.1 (*)    MCV 66.8 (*)    MCH 19.9 (*)    MCHC 29.8 (*)    RDW 33.1 (*)    All other components within normal limits  URINALYSIS, ROUTINE W REFLEX MICROSCOPIC - Abnormal; Notable for the following components:   Color, Urine ORANGE (*)    APPearance HAZY (*)    Specific Gravity, Urine 1.040 (*)    Hgb urine dipstick LARGE (*)    Ketones, ur >80 (*)    Protein, ur >300 (*)     Leukocytes,Ua SMALL (*)    All other components within normal limits  LACTIC ACID, PLASMA - Abnormal; Notable for the following components:   Lactic Acid, Venous 2.1 (*)    All other components within normal limits  LIPASE, BLOOD  PREGNANCY, URINE  LACTIC ACID, PLASMA    EKG EKG Interpretation Date/Time:  Wednesday Feb 27 2024 07:48:34 EDT Ventricular Rate:  109 PR Interval:  131 QRS Duration:  79 QT Interval:  351 QTC Calculation: 473 R  Axis:   48  Text Interpretation: Sinus tachycardia Ventricular premature complex Probable left atrial enlargement when compared to prior, slower rate No STEMI Confirmed by Wynell Heath ([id number]) on 02/27/2024 8:08:58 AM  Radiology CT ABDOMEN PELVIS W CONTRAST Result Date: 02/27/2024 CLINICAL DATA:  Epigastric pain and vomiting for 3 days. EXAM: CT ABDOMEN AND PELVIS WITH CONTRAST TECHNIQUE: Multidetector CT imaging of the abdomen and pelvis was performed using the standard protocol following bolus administration of intravenous contrast. RADIATION DOSE REDUCTION: This exam was performed according to the departmental dose-optimization program which includes automated exposure control, adjustment of the mA and/or kV according to patient size and/or use of iterative reconstruction technique. CONTRAST:  OMNIPAQUE  IOHEXOL  350 MG/ML SOLN COMPARISON:  August 11, 2019 FINDINGS: Lower chest: No acute abnormality. Hepatobiliary: No focal liver abnormality is seen. No gallstones, gallbladder wall thickening, or biliary dilatation. Pancreas: Unremarkable. No pancreatic ductal dilatation or surrounding inflammatory changes. Spleen: Normal in size without focal abnormality. Adrenals/Urinary Tract: Adrenal glands are unremarkable. Kidneys are normal, without renal calculi, focal lesion, or hydronephrosis. Bladder is unremarkable. Stomach/Bowel: Stomach is within normal limits. Appendix appears normal. No evidence of bowel wall thickening, distention, or inflammatory  changes. Vascular/Lymphatic: No significant vascular findings are present. No enlarged abdominal or pelvic lymph nodes. Reproductive: A 4.3 cm x 2.4 cm x 1.8 cm area of heterogeneous low attenuation is seen within the uterus on the left. Mass effect on the endometrium is suspected. The bilateral adnexa are unremarkable. Other: A 2.2 cm x 2.8 cm x 2.9 cm fat containing umbilical hernia is noted. No abdominopelvic ascites. Musculoskeletal: No acute or significant osseous findings. IMPRESSION: 1. Findings which may represent a uterine fibroid. Correlation with nonemergent pelvic ultrasound is recommended to further exclude the presence of an underlying neoplastic process. 2. Fat-containing umbilical hernia. Electronically Signed   By: Virgle Grime M.D.   On: 02/27/2024 14:03   CT Angio Chest PE W and/or Wo Contrast Result Date: 02/27/2024 CLINICAL DATA:  Vomiting and epigastric pain x3 days. EXAM: CT ANGIOGRAPHY CHEST WITH CONTRAST TECHNIQUE: Multidetector CT imaging of the chest was performed using the standard protocol during bolus administration of intravenous contrast. Multiplanar CT image reconstructions and MIPs were obtained to evaluate the vascular anatomy. RADIATION DOSE REDUCTION: This exam was performed according to the departmental dose-optimization program which includes automated exposure control, adjustment of the mA and/or kV according to patient size and/or use of iterative reconstruction technique. CONTRAST:  OMNIPAQUE  IOHEXOL  350 MG/ML SOLN COMPARISON:  None Available. FINDINGS: Cardiovascular: The thoracic aorta is unremarkable. Satisfactory opacification of the pulmonary arteries to the segmental level. No evidence of pulmonary embolism. Normal heart size. No pericardial effusion. Mediastinum/Nodes: No enlarged mediastinal, hilar, or axillary lymph nodes. Thyroid gland, trachea, and esophagus demonstrate no significant findings. Lungs/Pleura: Lungs are clear. No pleural effusion or  pneumothorax. Upper Abdomen: No acute abnormality. Musculoskeletal: No chest wall abnormality. No acute or significant osseous findings. Review of the MIP images confirms the above findings. IMPRESSION: No evidence of pulmonary embolism or other acute intrathoracic process. Electronically Signed   By: Virgle Grime M.D.   On: 02/27/2024 13:59   US  Abdomen Limited RUQ (LIVER/GB) Result Date: 02/27/2024 CLINICAL DATA:  151470 RUQ abdominal pain 151470 EXAM: ULTRASOUND ABDOMEN LIMITED RIGHT UPPER QUADRANT COMPARISON:  None Available. FINDINGS: Gallbladder: No gallstones or wall thickening visualized. No sonographic Murphy sign noted by sonographer. Common bile duct: Diameter: 2.7 mm Liver: No focal lesion identified. Within normal limits in parenchymal echogenicity. Portal vein is  patent on color Doppler imaging with normal direction of blood flow towards the liver. Other: No free fluid or ascites IMPRESSION: Normal right upper quadrant ultrasound. Electronically Signed   By: Melven Stable.  Shick M.D.   On: 02/27/2024 09:29    Procedures Procedures    Medications Ordered in ED Medications  morphine (PF) 4 MG/ML injection 4 mg (4 mg Intravenous Not Given 02/27/24 1125)  ondansetron  (ZOFRAN ) injection 4 mg (4 mg Intravenous Given 02/27/24 0651)  sodium chloride  0.9 % bolus 1,000 mL (0 mLs Intravenous Stopped 02/27/24 1029)  alum & mag hydroxide-simeth (MAALOX/MYLANTA) 200-200-20 MG/5ML suspension 30 mL (30 mLs Oral Given 02/27/24 0800)    And  lidocaine  (XYLOCAINE ) 2 % viscous mouth solution 15 mL (15 mLs Oral Given 02/27/24 0800)  sodium chloride  0.9 % bolus 1,000 mL (0 mLs Intravenous Stopped 02/27/24 1238)  iohexol  (OMNIPAQUE ) 350 MG/ML injection 100 mL (100 mLs Intravenous Contrast Given 02/27/24 1136)  alum & mag hydroxide-simeth (MAALOX/MYLANTA) 200-200-20 MG/5ML suspension 10 mL (10 mLs Oral Given 02/27/24 1203)  ondansetron  (ZOFRAN ) injection 4 mg (4 mg Intravenous Given 02/27/24 1146)    ED Course/  Medical Decision Making/ A&P                                 Medical Decision Making Amount and/or Complexity of Data Reviewed Labs: ordered. Radiology: ordered.  Risk OTC drugs. Prescription drug management.    Lynn Wilcox is a 43 y.o. female with a past medical history significant for asthma, GERD, hiatal hernia, migraines, and diverticulosis who presents with nausea, vomiting, and abdominal pain.  According to patient, for the last 3 days she has had significant nausea, vomiting, and abdominal discomfort primarily upper abdomen.  She says that she had a implant for birth control placed within the month and she has had several episodes of this nausea and vomiting and pain that she attributes to being near her menstrual cycle.  She is on her cycle right now.  She reports the pain is up to 10 out of 10 in her upper abdomen.  It is not painful in her lower abdomen.  She reports that she has had normal bowel movements but has not urinated is much in the last few days.  She think she could be dehydrated.  She denies dysuria however.  Denies any vaginal discharge or concern for infection.  Denies flank or back pain and denies any chest pain or shortness of breath.  Denies palpitations but she is slightly tachycardic on arrival.  Denies any headache or neck pain.  Denies any fevers or chills.  No sick contacts to her report and she denies other leg pains or leg swelling.  On exam, lungs were clear.  Chest nontender.  No murmur.  Back and flanks nontender.  Abdomen is primarily tender in the epigastric and right upper quadrant.  No tenderness in the lower abdomen.  Bowel sounds were appreciated.  Mucous membranes are dry.  Patient otherwise moving all extremities and had no focal neurologic deficits.  No rashes seen.  Patient was tachycardic on arrival but is afebrile.  Blood pressure is elevated as she is retching and vomiting.  Due to her upper abdominal discomfort, we discussed this could be a  gastritis or even ulceration versus something like a gallbladder etiology.  Will get right upper quadrant ultrasound and will get screening labs.  Will give her some fluids and a GI cocktail.  Patient  already received some Zofran  in triage that seem to help the nausea she reports.  Given her lack of chest pain or shortness of breath or pleuritic symptoms of low suspicion for a thromboembolic etiology, will hold on that workup.  With her lack of lower abdominal discomfort and low suspicion for something of diverticulitis at this time.  Will start with ultrasound but if symptoms were not to improve or to progress, would consider CT as well.  Given her history of hiatal hernia this is also a consideration.  Anticipate reassessment after initial medications and workup and if she is not improved anticipate CT and further workup.  10:27 AM Patient still feeling bad and heart rate is between 100 and 140.  She still has dry mucous membranes.  Will give more fluids.  Her labs are returning showing elevated anion gap at 23.  Suspect it may be dehydration ketosis or lactic acidosis.  Lactic was 2.1.  She does have a white count and similar anemia to prior.  Lipase normal and the metabolic panel did not show acute evidence of AKI or LFT troubles.  Will get urinalysis and will trend lactic.  Will give her more fluids and we will end up getting the CT abdomen pelvis and a CT PE study given the upper abdominal lower chest discomfort with this tachycardia.  The ultrasound was reassuring.  Will get the imaging to make sure there is not some hiatal hernia problem in her abdomen or chest and also does not have a subtle PE on her vital signs.   3:07 PM Continues to return.  Patient does have evidence of dehydration with elevated ketones and I am concerned about decreased CO2 and elevated anion gap.  Suspect it is a dehydration ketosis from all this vomiting.  Lactic acid improved with fluids and patient started feel better.   We had a shared decision-making conversation about admission for this dehydration and ketosis versus discharge, patient would rather go home.  Her CT imaging did not show evidence of bowel obstruction or intrathoracic abnormality.  She did have a fibroid which we discussed and she will follow-up with her PCP for ultrasound and further management.  Patient agreed with plan and will give prescription for pain medicine and nausea medicine and a work note for several days.  She understands return precautions and follow-up instructions.  Again we offered admission for rehydration given her electrolyte disturbances but patient like to go home.  Patient had no other questions or concerns and was discharged in stable condition after p.o. challenge successfully.        Final Clinical Impression(s) / ED Diagnoses Final diagnoses:  Dehydration  Nausea and vomiting, unspecified vomiting type  Abdominal cramping    Rx / DC Orders ED Discharge Orders          Ordered    oxyCODONE -acetaminophen  (PERCOCET/ROXICET) 5-325 MG tablet  Every 4 hours PRN        02/27/24 1510    ondansetron  (ZOFRAN -ODT) 4 MG disintegrating tablet  Every 8 hours PRN        02/27/24 1510            Clinical Impression: 1. Dehydration   2. Nausea and vomiting, unspecified vomiting type   3. Abdominal cramping     Disposition: Discharge  Condition: Good  I have discussed the results, Dx and Tx plan with the pt(& family if present). He/she/they expressed understanding and agree(s) with the plan. Discharge instructions discussed at great length. Strict return  precautions discussed and pt &/or family have verbalized understanding of the instructions. No further questions at time of discharge.    New Prescriptions   ONDANSETRON  (ZOFRAN -ODT) 4 MG DISINTEGRATING TABLET    Take 1 tablet (4 mg total) by mouth every 8 (eight) hours as needed for nausea or vomiting.   OXYCODONE -ACETAMINOPHEN  (PERCOCET/ROXICET) 5-325 MG  TABLET    Take 1 tablet by mouth every 4 (four) hours as needed for severe pain (pain score 7-10).    Follow Up: No follow-up provider specified.    Rozina Pointer, Marine Sia, MD 02/27/24 [phone]

## 2024-02-27 NOTE — Discharge Instructions (Signed)
 Your history, exam, workup today are consistent with significant dehydration due to all the nausea and vomiting.  As we discussed thus this could be related to some of the menstrual changes and hormonal changes with your new implant as you have had these episodes in the past however, your imaging today of your chest and abdomen were overall reassuring.  You did have a likely uterine fibroid that we found on CT imaging in your uterus that you need to follow-up with your OB/GYN or PCP about for further ultrasound if needed.  We had a shared decision-making conversation about either admission for rehydration versus letting you go home with prescription for nausea medicine and pain medicine and we agreed with plan for discharge given your stability for over 8 and half hours and improving symptoms.  Please rest and stay hydrated and follow-up with your primary doctor.  If any symptoms were to change or worsen acutely, please return to the nearest emergency department for reevaluation and further management.

## 2024-02-27 NOTE — ED Triage Notes (Signed)
 Pt arrived with vomiting x 3 days and epigastric pain. Pt actively vomiting on assessment, restless.  Currently on menstrual cycle, recent birth control implant to left upper arm

## 2024-03-06 ENCOUNTER — Other Ambulatory Visit: Payer: Self-pay | Admitting: Family Medicine

## 2024-03-06 ENCOUNTER — Ambulatory Visit: Admitting: Family Medicine

## 2024-03-06 VITALS — BP 118/70 | HR 78 | Ht 63.0 in | Wt 245.4 lb

## 2024-03-06 DIAGNOSIS — R87612 Low grade squamous intraepithelial lesion on cytologic smear of cervix (LGSIL): Secondary | ICD-10-CM | POA: Diagnosis present

## 2024-03-06 DIAGNOSIS — R52 Pain, unspecified: Secondary | ICD-10-CM

## 2024-03-06 MED ORDER — ACETAMINOPHEN 500 MG PO TABS
500.0000 mg | ORAL_TABLET | Freq: Once | ORAL | Status: AC
  Administered 2024-03-06: 500 mg via ORAL

## 2024-03-06 NOTE — Progress Notes (Addendum)
 Patient ID: Lynn Wilcox, female   DOB: [birth year], 43 y.o.   MRN: [id number]  No chief complaint on file.   HPI Lynn Wilcox is a 43 y.o. female.  Here for colposcopy. No new concerns.  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 Previous colposcopy: N/A. Prior cervical treatment: no treatment.  Past Medical History:  Diagnosis Date   Anxiety and depression 1995   Arthritis 2014   Asthma 1993   Uses inhaler prn   Diverticulosis    Esophagitis    H/O gastroesophageal reflux (GERD) 2013   Hiatal hernia    Hypokalemia    Migraines    Obesity    Seasonal allergies     Past Surgical History:  Procedure Laterality Date   BREAST BIOPSY     age 57   WISDOM TOOTH EXTRACTION      Family History  Problem Relation Age of Onset   Stroke Mother    Hypertension Mother    Hyperlipidemia Mother    Colon cancer Neg Hx    Stomach cancer Neg Hx    Esophageal cancer Neg Hx     Social History Social History   Tobacco Use   Smoking status: Former    Current packs/day: 0.00    Types: Cigarettes    Quit date: 2003    Years since quitting: 22.4    Passive exposure: Current   Smokeless tobacco: Never  Vaping Use   Vaping status: Never Used  Substance Use Topics   Alcohol use: Never   Drug use: Yes    Frequency: 2.0 times per week    Types: Marijuana    Allergies  Allergen Reactions   Chocolate Other (See Comments)    Throat itches   Coffee Bean Extract [Coffea Arabica] Other (See Comments)    Throat itches   Soy Allergy (Obsolete) Other (See Comments)    Throat itches   Tea Other (See Comments)    Throat itches   Yeast-Derived Drug Products Other (See Comments)    Throat itches    Current Outpatient Medications  Medication Sig Dispense Refill   cetirizine  (ZYRTEC ) 10 MG tablet TAKE 1 TABLET BY MOUTH DAILY 90 tablet 3   famotidine  (PEPCID ) 40 MG tablet Take 1 tablet  (40 mg total) by mouth daily.     ferrous sulfate  324 (65 Fe) MG TBEC Take 1 tablet (325 mg total) by mouth every other day. 30 tablet 2   ibuprofen  (ADVIL ,MOTRIN ) 600 MG tablet Take 1 tablet (600 mg total) by mouth every 6 (six) hours as needed for mild pain, moderate pain or cramping. 30 tablet 0   ondansetron  (ZOFRAN -ODT) 4 MG disintegrating tablet Take 1 tablet (4 mg total) by mouth every 8 (eight) hours as needed. 20 tablet 0   ondansetron  (ZOFRAN -ODT) 4 MG disintegrating tablet Take 1 tablet (4 mg total) by mouth every 8 (eight) hours as needed for nausea or vomiting. 20 tablet 0   oxyCODONE -acetaminophen  (PERCOCET/ROXICET) 5-325 MG tablet Take 1 tablet by mouth every 4 (four) hours as needed for severe pain (pain score 7-10). 15 tablet 0   potassium chloride  SA (KLOR-CON  M) 20 MEQ tablet Take 1 tablet (20 mEq total) by mouth 2 (two) times daily for 4 days. 4 tablet 0   PROAIR  HFA 108 (90 Base) MCG/ACT inhaler INHALE 2 PUFFS INTO THE LUNGS EVERY 6 HOURS AS NEEDED FOR WHEEZING 8 g 0   promethazine  (PHENERGAN ) 25 MG  tablet Take 1 tablet (25 mg total) by mouth every 6 (six) hours as needed for nausea or vomiting. 30 tablet 0   No current facility-administered medications for this visit.    Review of Systems Review of Systems  All other systems reviewed and are negative.   Last menstrual period 02/27/2024.  Physical Exam Physical Exam Nursing note reviewed. Exam conducted with a chaperone present Alisa App Pipkin).  Constitutional:      Appearance: Normal appearance.     Comments: Anxious  Pulmonary:     Effort: Pulmonary effort is normal.  Genitourinary:    Comments: Colposcopy exam non-satisfactory Aceto-white lesion at 6 o,clock No abnormal vascularization     Data Reviewed 03/06/24  Assessment    Procedure Details  The risks and benefits of the procedure and Written informed consent obtained.  Speculum placed in vagina and excellent visualization of cervix achieved,  cervix swabbed x 3 with acetic acid solution.  Specimens: Cervical biopsy at 6 O'clock  Complications: mild bleeding.     Plan    Specimens labelled and sent to Pathology. Return to discuss Pathology results in 2 weeks. Tylenol  500 mg PO x 1 given for pain post procedure.       Decarlo Rivet 03/06/2024, 9:00 AM

## 2024-03-06 NOTE — Patient Instructions (Signed)
 Colposcopy, Care After  The following information offers guidance on how to care for yourself after your procedure. Your doctor may also give you more specific instructions. If you have problems or questions, contact your doctor. What can I expect after the procedure? If you did not have a sample of your tissue taken out (did not have a biopsy), you may only have some spotting of blood for a few days. You can go back to your normal activities. If you had a sample of your tissue taken out, it is common to have: Soreness and mild pain. These may last for a few days. Mild bleeding or fluid (discharge) coming from your vagina. The fluid will look dark and grainy. You may have this for a few days. The fluid may be caused by a liquid that was used during your procedure. You may need to wear a sanitary pad. Spotting of blood for at least 48 hours after the procedure. Follow these instructions at home: Medicines Take over-the-counter and prescription medicines only as told by your doctor. Ask your doctor what over-the-counter pain medicines and prescription medicines you can start taking again. This is very important if you take blood thinners. Activity For at least 3 days, or for as long as told by your doctor, avoid: Douching. Using tampons. Having sex. Return to your normal activities as told by your doctor. Ask your doctor what activities are safe for you. General instructions Ask your doctor if you may take baths, swim, or use a hot tub. You may take showers. If you use birth control (contraception), keep using it. Keep all follow-up visits. Contact a doctor if: You have a fever or chills. You faint or feel light-headed. Get help right away if: You bleed a lot from your vagina. A lot of bleeding means that the bleeding soaks through a pad in less than 1 hour. You have clumps of blood (blood clots) coming from your vagina. You have signs that could mean you have an infection. This may be  fluid coming from your vagina that is: Different than normal. Yellow. Bad-smelling. You have very bad pain or cramps in your lower belly that do not get better with medicine. Summary If you did not have a sample of your tissue taken out, you may only have some spotting of blood for a few days. You can go back to your normal activities. If you had a sample of your tissue taken out, it is common to have mild pain for a few days and spotting for 48 hours. Avoid douching, using tampons, and having sex for at least 3 days after the procedure or for as long as told. Get help right away if you have a lot of bleeding, very bad pain, or signs of infection. This information is not intended to replace advice given to you by your health care provider. Make sure you discuss any questions you have with your health care provider. Document Revised: 02/20/2021 Document Reviewed: 02/20/2021 Elsevier Patient Education  2024 [employer].

## 2024-03-06 NOTE — Addendum Note (Signed)
 Addended by: Penni Bowman T on: 03/06/2024 11:07 AM   Modules accepted: Orders

## 2024-03-07 ENCOUNTER — Ambulatory Visit: Payer: Self-pay | Admitting: Family Medicine

## 2024-03-07 DIAGNOSIS — R87613 High grade squamous intraepithelial lesion on cytologic smear of cervix (HGSIL): Secondary | ICD-10-CM

## 2024-03-07 LAB — SURGICAL PATHOLOGY

## 2024-03-07 NOTE — Telephone Encounter (Signed)
 Cervical biopsy result discussed  HSIL - need LEEP Treatment recommendation and need for Gyn referral discussed and she agreed with the plan. All questions were answered.

## 2024-03-10 ENCOUNTER — Ambulatory Visit: Admitting: Student

## 2024-03-10 NOTE — Progress Notes (Deleted)
  SUBJECTIVE:   CHIEF COMPLAINT / HPI:   Presents today for follow-up after Nexplanon  placement and anemia follow-up.  She received iron infusion on 5/9 and 5/16.  She was seen in the ED on 5/21 for dehydration with accompanying lab abnormalities decreased CO2 and elevated anion gap.  RUQ ultrasound and CT abdomen pelvis were unremarkable.  She was on her period at the time.  PERTINENT  PMH / PSH: Asthma, GERD, anemia   OBJECTIVE:  LMP 02/27/2024  ***  ASSESSMENT/PLAN:   Assessment & Plan  No follow-ups on file. Lynn Goon, DO 03/10/2024, 7:49 AM PGY-3, [facility] {    This will disappear when note is signed, click to select method of visit    :1}

## 2024-03-17 NOTE — Progress Notes (Unsigned)
 Lynn Wilcox [id number] 10/23/1980   Chief Complaint: Vomiting  Referring Provider: McDiarmid, Demetra Filter, MD Primary GI MD: Para Bold (previous Dr. Savannah Curlin)  HPI: Lynn Wilcox is a 43 y.o. female with past medical history of GERD, hiatal hernia, esophagitis, diverticulosis, iron deficiency anemia, asthma, migraines, obesity, anxiety/depression who presents today for a complaint of vomiting.    Patient last seen in office 07/19/2020 for complaint of vomiting.  Found to have LA grade A reflux esophagitis and intestinal metaplasia on EGD 05/2020.  Recommendation was to complete 8 weeks of max PPI therapy with famotidine  as needed for breakthrough.  Thought was that intermittent abdominal pain and vomiting may be associated with reflux, though if symptoms were to persist on PPI therapy, could proceed with evaluation for other causes.  Since last office visit, patient has had multiple visits to the ED for nausea and vomiting. Most recently seen in the ED on 02/27/2024 with abdominal discomfort primarily in her upper abdomen, nausea, vomiting.  Reported being placed on birth control and having several episodes of nausea/vomiting/pain which she attributed to being near her menstrual cycle.  Workup unrevealing including CT A/P and RUQ US .  CT PE study also negative.   Patient has been taking OTC omeprazole  once a day.  If she eats something that triggers her reflux she may take it twice a day.  She is not taking Pepcid .   Though she tries to avoid dietary triggers, she often still has breakthrough symptoms.  States she does not drink enough water, sometimes feels dehydrated.  She has intermittent episodes of nausea and vomiting which always last 3 to 4 days.  Once vomiting stops, she goes to the ED to get fluids.  She reports heavy menstrual periods with painful cramps and headaches.  This typically progresses to nausea and vomiting and the cycle repeats itself every month around this time.  After a  few days of vomiting, it takes a few days for her to recover.  This has caused her to miss a lot of work.  She reports seeing blood in her emesis in the past, has been told this was from esophageal irritation with frequent vomiting.  Patient has iron deficiency anemia which has been present for a few years.  States she has heavy menstrual bleeding and fibroids which is thought to be the cause of this.  She was recently started on Nexplanon  and has been on this for 2 months.   Back in April she was found to have a hemoglobin of 6.6, was started on IV iron infusions, hemoglobin at last ED visit was 11.1.  Patient does smoke marijuana on the weekends, and also sometimes when she feels nauseated.  She denies daily marijuana use, but she has used marijuana for a long time.  States that she tried stopping marijuana for 2 months but did not notice any improvement in her symptoms did come back around the time of period despite her smoking cessation.  A hot shower does not necessarily help.  She has lower abdominal cramping during menstruation but after a couple days of vomiting has epigastric pain.  GI cocktail which she often receives in the ED does help with this.  Nausea improves with Zofran .   She has a bowel movement a couple times a day, depending on how much fiber she is eating.  She denies any constipation, but during episodes of vomiting she can sometimes have diarrhea, and has noticed that stool has occasionally been black during these times.  She  last noticed dark stools a few weeks ago but since then stool has been normal in appearance.   No prior colonoscopy.  Previous GI Procedures/Imaging   CT A/P 02/27/2024 1. Findings which may represent a uterine fibroid. Correlation with nonemergent pelvic ultrasound is recommended to further exclude the presence of an underlying neoplastic process. 2. Fat-containing umbilical hernia.  RUQ US  02/27/2024 Normal right upper quadrant ultrasound.    EGD 05/31/2020 - LA Grade A reflux esophagitis with no bleeding. Biopsied.  - Erythematous mucosa in the antrum. Biopsied. - Small hiatal hernia.  - Erythematous duodenopathy. Biopsied.  - The examination was otherwise normal. Path: 1. Surgical [P], duodenal bulb, 2nd portion of duodenum and distal duodenum - BENIGN DUODENAL MUCOSA. - NO FEATURES OF CELIAC SPRUE OR GRANULOMAS. 2. Surgical [P], fundus, gastric antrum and gastric body - ANTRAL AND OXYNTIC MUCOSA WITH MILD CHRONIC INFLAMMATION AND INTESTINAL METAPLASIA. - WARTHIN-STARRY NEGATIVE FOR HELICOBACTER PYLORI. - NO DYSPLASIA OR CARCINOMA. 3. Surgical [P], distal esophagus - BENIGN SQUAMOUS MUCOSA. - NO INTESTINAL METAPLASIA, DYSPLASIA OR CARCINOMA. 4. Surgical [P], mid and proximal esophagus - BENIGN SQUAMOUS MUCOSA. - NO EOSINOPHILIC ESOPHAGITIS (LESS THAN 5 PER HIGH POWER FIELD).  Past Medical History:  Diagnosis Date   Anxiety and depression 1995   Arthritis 2014   Asthma 1993   Uses inhaler prn   Diverticulosis    Esophagitis    H/O gastroesophageal reflux (GERD) 2013   Hiatal hernia    Hypokalemia    Migraines    Obesity    Seasonal allergies     Past Surgical History:  Procedure Laterality Date   BREAST BIOPSY     age 89   WISDOM TOOTH EXTRACTION      Current Outpatient Medications  Medication Sig Dispense Refill   cetirizine  (ZYRTEC ) 10 MG tablet TAKE 1 TABLET BY MOUTH DAILY 90 tablet 3   famotidine  (PEPCID ) 40 MG tablet Take 1 tablet (40 mg total) by mouth daily.     ferrous sulfate  324 (65 Fe) MG TBEC Take 1 tablet (325 mg total) by mouth every other day. 30 tablet 2   ibuprofen  (ADVIL ,MOTRIN ) 600 MG tablet Take 1 tablet (600 mg total) by mouth every 6 (six) hours as needed for mild pain, moderate pain or cramping. 30 tablet 0   ondansetron  (ZOFRAN -ODT) 4 MG disintegrating tablet Take 1 tablet (4 mg total) by mouth every 8 (eight) hours as needed. 20 tablet 0   ondansetron  (ZOFRAN -ODT) 4 MG  disintegrating tablet Take 1 tablet (4 mg total) by mouth every 8 (eight) hours as needed for nausea or vomiting. 20 tablet 0   oxyCODONE -acetaminophen  (PERCOCET/ROXICET) 5-325 MG tablet Take 1 tablet by mouth every 4 (four) hours as needed for severe pain (pain score 7-10). 15 tablet 0   potassium chloride  SA (KLOR-CON  M) 20 MEQ tablet Take 1 tablet (20 mEq total) by mouth 2 (two) times daily for 4 days. 4 tablet 0   PROAIR  HFA 108 (90 Base) MCG/ACT inhaler INHALE 2 PUFFS INTO THE LUNGS EVERY 6 HOURS AS NEEDED FOR WHEEZING 8 g 0   promethazine  (PHENERGAN ) 25 MG tablet Take 1 tablet (25 mg total) by mouth every 6 (six) hours as needed for nausea or vomiting. 30 tablet 0   No current facility-administered medications for this visit.    Allergies as of 03/18/2024 - Review Complete 03/06/2024  Allergen Reaction Noted   Chocolate Other (See Comments) 12/20/2012   Coffee bean extract [coffea arabica] Other (See Comments) 12/20/2012   Soy  allergy (obsolete) Other (See Comments) 12/20/2012   Tea Other (See Comments) 12/20/2012   Yeast-derived drug products Other (See Comments) 12/20/2012    Family History  Problem Relation Age of Onset   Stroke Mother    Hypertension Mother    Hyperlipidemia Mother    Colon cancer Neg Hx    Stomach cancer Neg Hx    Esophageal cancer Neg Hx     Social History   Tobacco Use   Smoking status: Former    Current packs/day: 0.00    Types: Cigarettes    Quit date: 2003    Years since quitting: 22.4    Passive exposure: Current   Smokeless tobacco: Never  Vaping Use   Vaping status: Never Used  Substance Use Topics   Alcohol use: Never   Drug use: Yes    Frequency: 2.0 times per week    Types: Marijuana     Review of Systems:    Constitutional: No weight loss, fever, chills, weakness or fatigue Skin: No rash or itching Cardiovascular: No chest pain, chest pressure or palpitations   Respiratory: No SOB or cough Gastrointestinal: See HPI and  otherwise negative Genitourinary: No dysuria or change in urinary frequency Neurological: No headache, dizziness or syncope Musculoskeletal: No new muscle or joint pain Hematologic: No bruising    Physical Exam:  Vital signs: BP 120/64   Ht 5\' 3"  (1.6 m)   Wt 244 lb (110.7 kg)   LMP 02/27/2024   BMI 43.22 kg/m    Constitutional: NAD, obese, alert and cooperative Head:  Normocephalic and atraumatic.  Eyes: No scleral icterus. Conjunctiva pink. Mouth: No oral lesions. Respiratory: Respirations even and unlabored. Lungs clear to auscultation bilaterally.  No wheezes, crackles, or rhonchi.  Cardiovascular:  Regular rate and rhythm. No murmurs. No peripheral edema. Gastrointestinal:  Soft, nondistended, nontender. No rebound or guarding. Normal bowel sounds. No appreciable masses or hepatomegaly. Rectal:  Not performed.  Neurologic:  Alert and oriented x4;  grossly normal neurologically.  Skin:   Dry and intact without significant lesions or rashes. Psychiatric: Oriented to person, place and time. Demonstrates good judgement and reason without abnormal affect or behaviors.   RELEVANT LABS AND IMAGING: CBC    Component Value Date/Time   WBC 13.4 (H) 02/27/2024 0648   RBC 5.58 (H) 02/27/2024 0648   HGB 11.1 (L) 02/27/2024 0648   HGB 6.6 (LL) 01/22/2024 1124   HCT 37.3 02/27/2024 0648   HCT 26.5 (L) 01/22/2024 1124   PLT 342 02/27/2024 0648   PLT 412 01/22/2024 1124   MCV 66.8 (L) 02/27/2024 0648   MCV 67 (L) 01/22/2024 1124   MCH 19.9 (L) 02/27/2024 0648   MCHC 29.8 (L) 02/27/2024 0648   RDW 33.1 (H) 02/27/2024 0648   RDW 17.3 (H) 01/22/2024 1124   LYMPHSABS 2.2 01/22/2024 1124   MONOABS 1.1 (H) 10/29/2023 1517   EOSABS 0.2 01/22/2024 1124   BASOSABS 0.0 01/22/2024 1124    CMP     Component Value Date/Time   NA 140 02/27/2024 0648   NA 138 01/22/2024 1124   K 3.6 02/27/2024 0648   CL 101 02/27/2024 0648   CO2 16 (L) 02/27/2024 0648   GLUCOSE 215 (H) 02/27/2024  0648   GLUCOSE 77 06/11/2013 1148   BUN 12 02/27/2024 0648   BUN 6 01/22/2024 1124   CREATININE 0.87 02/27/2024 0648   CREATININE 0.70 12/23/2014 1423   CALCIUM 10.8 (H) 02/27/2024 0648   PROT 9.1 (H) 02/27/2024 1610  PROT 6.4 01/22/2024 1124   ALBUMIN 4.5 02/27/2024 0648   ALBUMIN 3.6 (L) 01/22/2024 1124   AST 26 02/27/2024 0648   ALT 14 02/27/2024 0648   ALKPHOS 62 02/27/2024 0648   BILITOT 0.5 02/27/2024 0648   BILITOT <0.2 01/22/2024 1124   GFRNONAA >60 02/27/2024 0648   GFRAA >60 04/16/2020 0103     Assessment/Plan:   Nausea with associated vomiting Epigastric pain GERD Gastric intestinal metaplasia Iron deficiency anemia Patient with frequent episodes of nausea, vomiting, and epigastric pain, which seem to be related to her menstrual cycle.  She has tried stopping marijuana in the past with no improvement.  Symptoms seem most consistent with cyclical vomiting syndrome; however, she does have history of gastritis with gastric intestinal metaplasia on EGD 2021 and did not have mapping at that time.  She has continued to have breakthrough acid reflux on PPI, has noticed blood in her emesis a couple of times during episodes of nausea and vomiting, and reports loose, black stools associated with these episodes as well.   She does have iron deficiency anemia which is thought to be related to menstrual blood loss.  Has recently required IV iron due to significant drop in hemoglobin to 6.6.  This has improved to 11.1.  - Schedule EGD for further evaluation of symptoms and for gastric biopsy mapping. I thoroughly discussed the procedure with the patient to include nature of the procedure, alternatives, benefits, and risks (including but not limited to bleeding, infection, perforation, anesthesia/cardiac/pulmonary complications). Patient verbalized understanding and gave verbal consent to proceed with procedure.  - Start pantoprazole  40mg  BID.  - Repeat CBC, CMP, iron/ferritin, B12,  folate - If no EGD findings to explain symptoms, consider cyclic vomiting syndrome (may be related to chronic cannabis use or estrogen sensitivity based on association with menstrual cycles).   Valiant Gaul, PA-C [facility] 03/17/2024, 8:21 PM  Patient Care Team: Veronia Goon, DO as PCP - General (Family Medicine)

## 2024-03-18 ENCOUNTER — Other Ambulatory Visit (INDEPENDENT_AMBULATORY_CARE_PROVIDER_SITE_OTHER)

## 2024-03-18 ENCOUNTER — Ambulatory Visit: Admitting: Gastroenterology

## 2024-03-18 ENCOUNTER — Encounter: Payer: Self-pay | Admitting: Gastroenterology

## 2024-03-18 VITALS — BP 120/64 | Ht 63.0 in | Wt 244.0 lb

## 2024-03-18 DIAGNOSIS — D509 Iron deficiency anemia, unspecified: Secondary | ICD-10-CM

## 2024-03-18 DIAGNOSIS — Z8719 Personal history of other diseases of the digestive system: Secondary | ICD-10-CM

## 2024-03-18 DIAGNOSIS — K921 Melena: Secondary | ICD-10-CM

## 2024-03-18 DIAGNOSIS — K31A0 Gastric intestinal metaplasia, unspecified: Secondary | ICD-10-CM

## 2024-03-18 DIAGNOSIS — K219 Gastro-esophageal reflux disease without esophagitis: Secondary | ICD-10-CM

## 2024-03-18 DIAGNOSIS — R1013 Epigastric pain: Secondary | ICD-10-CM

## 2024-03-18 DIAGNOSIS — R112 Nausea with vomiting, unspecified: Secondary | ICD-10-CM | POA: Diagnosis not present

## 2024-03-18 MED ORDER — PANTOPRAZOLE SODIUM 40 MG PO TBEC: 40.0000 mg | DELAYED_RELEASE_TABLET | Freq: Two times a day (BID) | ORAL | 3 refills | Status: DC

## 2024-03-18 NOTE — Patient Instructions (Signed)
 Your provider has requested that you go to the basement level for lab work before leaving today. Press "B" on the elevator. The lab is located at the first door on the left as you exit the elevator.  You have been scheduled for an endoscopy. Please follow written instructions given to you at your visit today.  If you use inhalers (even only as needed), please bring them with you on the day of your procedure.  If you take any of the following medications, they will need to be adjusted prior to your procedure:   DO NOT TAKE 7 DAYS PRIOR TO TEST- Trulicity (dulaglutide) Ozempic, Wegovy (semaglutide) Mounjaro (tirzepatide) Bydureon Bcise (exanatide extended release)  DO NOT TAKE 1 DAY PRIOR TO YOUR TEST Rybelsus (semaglutide) Adlyxin (lixisenatide) Victoza (liraglutide) Byetta (exanatide) ___________________________________________________________________________   Due to recent changes in healthcare laws, you may see the results of your imaging and laboratory studies on MyChart before your provider has had a chance to review them.  We understand that in some cases there may be results that are confusing or concerning to you. Not all laboratory results come back in the same time frame and the provider may be waiting for multiple results in order to interpret others.  Please give us  48 hours in order for your provider to thoroughly review all the results before contacting the office for clarification of your results.   We have sent the following medications to your pharmacy for you to pick up at your convenience: Pantoprazole    Thank you for choosing me and [facility].

## 2024-03-21 ENCOUNTER — Ambulatory Visit: Payer: Self-pay | Admitting: Gastroenterology

## 2024-04-02 ENCOUNTER — Encounter: Payer: Self-pay | Admitting: Gastroenterology

## 2024-04-02 ENCOUNTER — Ambulatory Visit (AMBULATORY_SURGERY_CENTER): Admitting: Gastroenterology

## 2024-04-02 VITALS — BP 137/90 | HR 80 | Temp 98.2°F | Resp 12 | Ht 63.0 in | Wt 244.0 lb

## 2024-04-02 DIAGNOSIS — K2100 Gastro-esophageal reflux disease with esophagitis, without bleeding: Secondary | ICD-10-CM | POA: Diagnosis not present

## 2024-04-02 DIAGNOSIS — K2970 Gastritis, unspecified, without bleeding: Secondary | ICD-10-CM

## 2024-04-02 DIAGNOSIS — R112 Nausea with vomiting, unspecified: Secondary | ICD-10-CM

## 2024-04-02 MED ORDER — SODIUM CHLORIDE 0.9 % IV SOLN: 500.0000 mL | Freq: Once | INTRAVENOUS | Status: DC

## 2024-04-02 NOTE — Progress Notes (Unsigned)
 Pt sedate, gd SR's, VSS, report to RN

## 2024-04-02 NOTE — Op Note (Signed)
 [facility] Patient Name: Lynn Wilcox Procedure Date: 04/02/2024 10:50 AM MRN: [id number] Endoscopist: Gustav ALONSO Mcgee , MD, [id number] Age: 43 Referring MD:  Date of Birth: 06/18/1981 Gender: Female Account #: [id number] Procedure:                Upper GI endoscopy Indications:              Persistent vomiting of unknown cause, Nausea Medicines:                Monitored Anesthesia Care Procedure:                Pre-Anesthesia Assessment:                           - Prior to the procedure, a History and Physical                            was performed, and patient medications and                            allergies were reviewed. The patient's tolerance of                            previous anesthesia was also reviewed. The risks                            and benefits of the procedure and the sedation                            options and risks were discussed with the patient.                            All questions were answered, and informed consent                            was obtained. Prior Anticoagulants: The patient has                            taken no anticoagulant or antiplatelet agents. ASA                            Grade Assessment: III - A patient with severe                            systemic disease. After reviewing the risks and                            benefits, the patient was deemed in satisfactory                            condition to undergo the procedure.                           After obtaining informed consent, the endoscope was  passed under direct vision. Throughout the                            procedure, the patient's blood pressure, pulse, and                            oxygen saturations were monitored continuously. The                            Olympus Scope [id number] was introduced through the                            mouth, and advanced to the second part of duodenum.                             The upper GI endoscopy was accomplished without                            difficulty. The patient tolerated the procedure                            well. Scope In: 11:09:02 AM Scope Out: 11:12:44 AM Total Procedure Duration: 0 hours 3 minutes 42 seconds  Findings:                 LA Grade B (one or more mucosal breaks greater than                            5 mm, not extending between the tops of two mucosal                            folds) esophagitis with no bleeding was found 36 to                            38 cm from the incisors.                           Patchy mild inflammation characterized by                            congestion (edema), erythema and friability was                            found in the entire examined stomach. Biopsies were                            taken with a cold forceps for Helicobacter pylori                            testing.                           The cardia and gastric fundus were normal on  retroflexion.                           The examined duodenum was normal. Complications:            No immediate complications. Estimated Blood Loss:     Estimated blood loss was minimal. Impression:               - LA Grade B reflux esophagitis with no bleeding.                           - Gastritis. Biopsied.                           - Normal examined duodenum. Recommendation:           - Patient has a contact number available for                            emergencies. The signs and symptoms of potential                            delayed complications were discussed with the                            patient. Return to normal activities tomorrow.                            Written discharge instructions were provided to the                            patient.                           - Resume previous diet.                           - Continue present medications.                           - Await pathology  results.                           - Avoid Marijuana Gustav ALONSO Mcgee, MD 04/02/2024 11:21:38 AM This report has been signed electronically.

## 2024-04-02 NOTE — Progress Notes (Unsigned)
 Pt's states no medical or surgical changes since previsit or office visit.

## 2024-04-02 NOTE — Progress Notes (Unsigned)
 [facility] History and Physical   Primary Care Physician:  Orlando Pond, DO   Reason for Procedure:  Nausea, vomiting, epigastric pain  Plan:    EGD  with possible interventions as needed     HPI: Lynn Wilcox is a very pleasant 43 y.o. female here for EGD for nausea, vomiting and epigastric pain.   The risks and benefits as well as alternatives of endoscopic procedure(s) have been discussed and reviewed. All questions answered. The patient agrees to proceed.    Past Medical History:  Diagnosis Date   Anxiety and depression 1995   Arthritis 2014   Asthma 1993   Uses inhaler prn   Diverticulosis    Esophagitis    H/O gastroesophageal reflux (GERD) 2013   Hiatal hernia    Hypokalemia    Migraines    Obesity    Seasonal allergies     Past Surgical History:  Procedure Laterality Date   BREAST BIOPSY     age 77   ESOPHAGOGASTRODUODENOSCOPY  05/2020   WISDOM TOOTH EXTRACTION      Prior to Admission medications   Medication Sig Start Date End Date Taking? Authorizing Provider  albuterol  (VENTOLIN  HFA) 108 (90 Base) MCG/ACT inhaler Inhale 2 puffs into the lungs. 10/26/22  Yes [provider]  aluminum-magnesium  hydroxide 200-200 MG/5ML suspension Take 30 mLs by mouth. 05/01/23  Yes [provider]  ferrous sulfate  324 (65 Fe) MG TBEC Take 1 tablet (325 mg total) by mouth every other day. 05/03/23  Yes Joshua Domino, DO  pantoprazole  (PROTONIX ) 40 MG tablet Take 1 tablet (40 mg total) by mouth 2 (two) times daily. 03/18/24  Yes Heinz, Camie BRAVO, PA-C  promethazine  (PHENERGAN ) 25 MG tablet Take 25 mg by mouth. 02/22/22  Yes [provider]  valACYclovir (VALTREX) 1000 MG tablet Take 1,000 mg by mouth. 05/01/22  Yes [provider]  cetirizine  (ZYRTEC ) 10 MG tablet TAKE 1 TABLET BY MOUTH DAILY 07/07/20   Dayna Motto, DO  famotidine  (PEPCID ) 40 MG tablet Take 1 tablet (40 mg total) by mouth daily. 05/03/23   Joshua Domino, DO   ibuprofen  (ADVIL ,MOTRIN ) 600 MG tablet Take 1 tablet (600 mg total) by mouth every 6 (six) hours as needed for mild pain, moderate pain or cramping. Patient not taking: Reported on 04/02/2024 08/24/13   Kizzie Suzen SAUNDERS, CNM  ondansetron  (ZOFRAN -ODT) 4 MG disintegrating tablet Take 1 tablet (4 mg total) by mouth every 8 (eight) hours as needed for nausea or vomiting. 02/27/24   Tegeler, Lonni PARAS, MD    Current Outpatient Medications  Medication Sig Dispense Refill   albuterol  (VENTOLIN  HFA) 108 (90 Base) MCG/ACT inhaler Inhale 2 puffs into the lungs.     aluminum-magnesium  hydroxide 200-200 MG/5ML suspension Take 30 mLs by mouth.     ferrous sulfate  324 (65 Fe) MG TBEC Take 1 tablet (325 mg total) by mouth every other day. 30 tablet 2   pantoprazole  (PROTONIX ) 40 MG tablet Take 1 tablet (40 mg total) by mouth 2 (two) times daily. 60 tablet 3   promethazine  (PHENERGAN ) 25 MG tablet Take 25 mg by mouth.     valACYclovir (VALTREX) 1000 MG tablet Take 1,000 mg by mouth.     cetirizine  (ZYRTEC ) 10 MG tablet TAKE 1 TABLET BY MOUTH DAILY 90 tablet 3   famotidine  (PEPCID ) 40 MG tablet Take 1 tablet (40 mg total) by mouth daily.     ibuprofen  (ADVIL ,MOTRIN ) 600 MG tablet Take 1 tablet (600 mg total) by mouth every  6 (six) hours as needed for mild pain, moderate pain or cramping. (Patient not taking: Reported on 04/02/2024) 30 tablet 0   ondansetron  (ZOFRAN -ODT) 4 MG disintegrating tablet Take 1 tablet (4 mg total) by mouth every 8 (eight) hours as needed for nausea or vomiting. 20 tablet 0   Current Facility-Administered Medications  Medication Dose Route Frequency Provider Last Rate Last Admin   0.9 %  sodium chloride  infusion  500 mL Intravenous Once Dantae Meunier V, MD        Allergies as of 04/02/2024 - Review Complete 04/02/2024  Allergen Reaction Noted   Coffee bean extract [coffea arabica] Other (See Comments) 12/20/2012   Soy allergy (obsolete) Other (See Comments) 12/20/2012    Tea Other (See Comments) 12/20/2012   Yeast-derived drug products Other (See Comments) 12/20/2012   Chocolate Other (See Comments) 12/20/2012    Family History  Problem Relation Age of Onset   Stroke Mother    Hypertension Mother    Hyperlipidemia Mother    Colon cancer Neg Hx    Stomach cancer Neg Hx    Esophageal cancer Neg Hx     Social History   Socioeconomic History   Marital status: Single    Spouse name: Not on file   Number of children: Not on file   Years of education: Not on file   Highest education level: Not on file  Occupational History   Not on file  Tobacco Use   Smoking status: Former    Current packs/day: 0.00    Types: Cigarettes    Quit date: 2003    Years since quitting: 22.4    Passive exposure: Current   Smokeless tobacco: Never  Vaping Use   Vaping status: Never Used  Substance and Sexual Activity   Alcohol use: Yes    Comment: socially   Drug use: Yes    Frequency: 2.0 times per week    Types: Marijuana    Comment: last weekend   Sexual activity: Yes    Partners: Male    Birth control/protection: Condom  Other Topics Concern   Not on file  Social History Narrative   Not on file   Social Drivers of Health   Financial Resource Strain: Not on file  Food Insecurity: Not on file  Transportation Needs: Not on file  Physical Activity: Not on file  Stress: Not on file  Social Connections: Unknown (02/13/2022)   Received from [facility]   Social Network    Social Network: Not on file  Intimate Partner Violence: Not At Risk (05/01/2023)   Received from [facility]   HITS    Over the last 12 months how often did your partner physically hurt you?: Never    Over the last 12 months how often did your partner insult you or talk down to you?: Never    Over the last 12 months how often did your partner threaten you with physical harm?: Never    Over the last 12 months how often did your partner scream or curse at you?: Never    Review of  Systems:  All other review of systems negative except as mentioned in the HPI.  Physical Exam: Vital signs in last 24 hours: BP 107/70   Pulse 81   Temp 98.2 F (36.8 C) (Temporal)   Ht 5' 3 (1.6 m)   Wt 244 lb (110.7 kg)   LMP 03/13/2024 (Exact Date)   SpO2 98%   BMI 43.22 kg/m  General:  Alert, NAD Lungs:  Clear .   Heart:  Regular rate and rhythm Abdomen:  Soft, nontender and nondistended. Neuro/Psych:  Alert and cooperative. Normal mood and affect. A and O x 3  Reviewed labs, radiology imaging, old records and pertinent past GI work up  Patient is appropriate for planned procedure(s) and anesthesia in an ambulatory setting   K. Veena Lougenia Morrissey , MD [phone]

## 2024-04-02 NOTE — Patient Instructions (Addendum)
 Be sure you take your protonix  twice daily 1/2 hour before you eat.  Take it everyday. Avoid Marijuana. You may resume all of your previous medications today. Read all of your handouts given to you by your recovery room nurse.   YOU HAD AN ENDOSCOPIC PROCEDURE TODAY AT THE [facility]:   Refer to the procedure report that was given to you for any specific questions about what was found during the examination.  If the procedure report does not answer your questions, please call your gastroenterologist to clarify.  If you requested that your care partner not be given the details of your procedure findings, then the procedure report has been included in a sealed envelope for you to review at your convenience later.  YOU SHOULD EXPECT: Some feelings of bloating in the abdomen. Passage of more gas than usual.  Walking can help get rid of the air that was put into your GI tract during the procedure and reduce the bloating.   Please Note:  You might notice some irritation and congestion in your nose or some drainage.  This is from the oxygen used during your procedure.  There is no need for concern and it should clear up in a day or so.  SYMPTOMS TO REPORT IMMEDIATELY:   Following upper endoscopy (EGD)  Vomiting of blood or coffee ground material  New chest pain or pain under the shoulder blades  Painful or persistently difficult swallowing  New shortness of breath  Fever of 100F or higher  Black, tarry-looking stools  For urgent or emergent issues, a gastroenterologist can be reached at any hour by calling [phone]. Do not use MyChart messaging for urgent concerns.    DIET:  We do recommend a small meal at first, but then you may proceed to your anti-reflux diet.  Drink plenty of fluids but you should avoid alcoholic beverages.  ACTIVITY:  You should plan to take it easy for the rest of today and you should NOT DRIVE or use heavy machinery until tomorrow (because of the  sedation medicines used during the test).    FOLLOW UP: Our staff will call the number listed on your records the next business day following your procedure.  We will call around 7:15- 8:00 am to check on you and address any questions or concerns that you may have regarding the information given to you following your procedure. If we do not reach you, we will leave a message.     If any biopsies were taken you will be contacted by phone or by letter within the next 1-3 weeks.  Please call us  at [phone] if you have not heard about the biopsies in 3 weeks.    SIGNATURES/CONFIDENTIALITY: You and/or your care partner have signed paperwork which will be entered into your electronic medical record.  These signatures attest to the fact that that the information above on your After Visit Summary has been reviewed and is understood.  Full responsibility of the confidentiality of this discharge information lies with you and/or your care-partner.

## 2024-04-02 NOTE — Progress Notes (Unsigned)
 Called to room to assist during endoscopic procedure.  Patient ID and intended procedure confirmed with present staff. Received instructions for my participation in the procedure from the performing physician.

## 2024-04-03 ENCOUNTER — Telehealth: Payer: Self-pay

## 2024-04-03 NOTE — Telephone Encounter (Signed)
 LMOM

## 2024-04-04 LAB — SURGICAL PATHOLOGY

## 2024-04-14 ENCOUNTER — Encounter (HOSPITAL_BASED_OUTPATIENT_CLINIC_OR_DEPARTMENT_OTHER): Payer: Self-pay

## 2024-04-14 ENCOUNTER — Emergency Department (HOSPITAL_BASED_OUTPATIENT_CLINIC_OR_DEPARTMENT_OTHER)
Admission: EM | Admit: 2024-04-14 | Discharge: 2024-04-15 | Disposition: A | Discharge status: Home / Self Care | Attending: Student | Admitting: Student

## 2024-04-14 DIAGNOSIS — Z87891 Personal history of nicotine dependence: Secondary | ICD-10-CM | POA: Insufficient documentation

## 2024-04-14 DIAGNOSIS — Z7951 Long term (current) use of inhaled steroids: Secondary | ICD-10-CM | POA: Diagnosis not present

## 2024-04-14 DIAGNOSIS — R109 Unspecified abdominal pain: Secondary | ICD-10-CM | POA: Insufficient documentation

## 2024-04-14 DIAGNOSIS — J45909 Unspecified asthma, uncomplicated: Secondary | ICD-10-CM | POA: Diagnosis not present

## 2024-04-14 DIAGNOSIS — R000 Tachycardia, unspecified: Secondary | ICD-10-CM | POA: Insufficient documentation

## 2024-04-14 DIAGNOSIS — N939 Abnormal uterine and vaginal bleeding, unspecified: Secondary | ICD-10-CM | POA: Diagnosis present

## 2024-04-14 DIAGNOSIS — R112 Nausea with vomiting, unspecified: Secondary | ICD-10-CM | POA: Diagnosis not present

## 2024-04-14 LAB — CBC WITH DIFFERENTIAL/PLATELET
Abs Immature Granulocytes: 0.04 K/uL (ref 0.00–0.07)
Basophils Absolute: 0 K/uL (ref 0.0–0.1)
Basophils Relative: 0 %
Eosinophils Absolute: 0 K/uL (ref 0.0–0.5)
Eosinophils Relative: 0 %
HCT: 27.9 % — ABNORMAL LOW (ref 36.0–46.0)
Hemoglobin: 8.7 g/dL — ABNORMAL LOW (ref 12.0–15.0)
Immature Granulocytes: 0 %
Lymphocytes Relative: 11 %
Lymphs Abs: 1.5 K/uL (ref 0.7–4.0)
MCH: 21.9 pg — ABNORMAL LOW (ref 26.0–34.0)
MCHC: 31.2 g/dL (ref 30.0–36.0)
MCV: 70.3 fL — ABNORMAL LOW (ref 80.0–100.0)
Monocytes Absolute: 1.1 K/uL — ABNORMAL HIGH (ref 0.1–1.0)
Monocytes Relative: 8 %
Neutro Abs: 10.2 K/uL — ABNORMAL HIGH (ref 1.7–7.7)
Neutrophils Relative %: 81 %
Platelets: 378 K/uL (ref 150–400)
RBC: 3.97 MIL/uL (ref 3.87–5.11)
RDW: 22 % — ABNORMAL HIGH (ref 11.5–15.5)
WBC: 12.9 K/uL — ABNORMAL HIGH (ref 4.0–10.5)
nRBC: 0 % (ref 0.0–0.2)

## 2024-04-14 LAB — COMPREHENSIVE METABOLIC PANEL WITH GFR
ALT: 14 U/L (ref 0–44)
AST: 20 U/L (ref 15–41)
Albumin: 4.3 g/dL (ref 3.5–5.0)
Alkaline Phosphatase: 64 U/L (ref 38–126)
Anion gap: 15 (ref 5–15)
BUN: 10 mg/dL (ref 6–20)
CO2: 27 mmol/L (ref 22–32)
Calcium: 9.5 mg/dL (ref 8.9–10.3)
Chloride: 96 mmol/L — ABNORMAL LOW (ref 98–111)
Creatinine, Ser: 0.89 mg/dL (ref 0.44–1.00)
GFR, Estimated: >60 mL/min (ref >60)
Glucose, Bld: 163 mg/dL — ABNORMAL HIGH (ref 70–99)
Potassium: 3.4 mmol/L — ABNORMAL LOW (ref 3.5–5.1)
Sodium: 139 mmol/L (ref 135–145)
Total Bilirubin: 0.3 mg/dL (ref 0.0–1.2)
Total Protein: 7.8 g/dL (ref 6.5–8.1)

## 2024-04-14 LAB — HCG, QUANTITATIVE, PREGNANCY: hCG, Beta Chain, Quant, S: <1 m[IU]/mL (ref <5)

## 2024-04-14 MED ORDER — MEGESTROL ACETATE 40 MG PO TABS
40.0000 mg | ORAL_TABLET | Freq: Every day | ORAL | Status: DC
  Administered 2024-04-14: 40 mg via ORAL
  Filled 2024-04-14: qty 1

## 2024-04-14 MED ORDER — LACTATED RINGERS IV BOLUS
1000.0000 mL | Freq: Once | INTRAVENOUS | Status: AC
  Administered 2024-04-14: 1000 mL via INTRAVENOUS

## 2024-04-14 MED ORDER — MORPHINE SULFATE (PF) 4 MG/ML IV SOLN
4.0000 mg | Freq: Once | INTRAVENOUS | Status: AC
  Administered 2024-04-14: 4 mg via INTRAVENOUS
  Filled 2024-04-14: qty 1

## 2024-04-14 MED ORDER — ONDANSETRON HCL 4 MG/2ML IJ SOLN
4.0000 mg | Freq: Once | INTRAMUSCULAR | Status: AC
  Administered 2024-04-14: 4 mg via INTRAVENOUS
  Filled 2024-04-14: qty 2

## 2024-04-14 NOTE — ED Triage Notes (Addendum)
 Pt states she is having abdominal pain, and n/v x 2 days States she can not keep anything down.   Also reports she in on her period and is bleeding more than usual and passing clots Pt states hs has not been able to urinate since Saturday

## 2024-04-14 NOTE — ED Provider Notes (Signed)
 [place] EMERGENCY DEPARTMENT AT [facility] Provider Note  CSN: [id number] Arrival date & time: 04/14/24 2053  Chief Complaint(s) Abdominal Pain and Emesis  HPI Loma Dubuque is a 43 y.o. female with PMH anxiety, depression, asthma, uterine fibroids, previous hospital evaluations for abdominal pain nausea and vomiting with recent EGD showing mild esophagitis who presents emerged part for evaluation of abdominal pain nausea and vomiting with vaginal bleeding.  States that her nausea and vomiting typically tend to worsen around when she is having her period and she has been having heavy bleeding.  She states she has an IUD in place but is not sure what kind.  Denies chest pain shortness of, had no fever or other systemic symptoms.  Of note, patient arrives very tachycardic with rates in the 140s.   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 Authorizing Provider  megestrol  (MEGACE ) 40 MG tablet Take 1 tablet (40 mg total) by mouth daily for 5 days. 04/15/24 04/20/24 Yes Roselyn Carlin NOVAK, MD  albuterol  (VENTOLIN  HFA) 108 [phone] Base) MCG/ACT inhaler Inhale 2 puffs into the lungs.  10/26/22   [provider]  aluminum-magnesium  hydroxide 200-200 MG/5ML suspension Take 30 mLs by mouth. 05/01/23   [provider]  cetirizine  (ZYRTEC ) 10 MG tablet TAKE 1 TABLET BY MOUTH DAILY 07/07/20   Dayna Motto, DO  famotidine  (PEPCID ) 40 MG tablet Take 1 tablet (40 mg total) by mouth daily. 05/03/23   Joshua Domino, DO  ferrous sulfate  324 (65 Fe) MG TBEC Take 1 tablet (325 mg total) by mouth every other day. 05/03/23   Joshua Domino, DO  ibuprofen  (ADVIL ,MOTRIN ) 600 MG tablet Take 1 tablet (600 mg total) by mouth every 6 (six) hours as needed for mild pain, moderate pain or cramping. Patient not taking: Reported on 04/02/2024 08/24/13   Kizzie Suzen SAUNDERS, CNM  ondansetron  (ZOFRAN -ODT) 4 MG disintegrating tablet Take 1 tablet (4 mg total) by mouth every 8 (eight) hours as needed for nausea or vomiting. 04/15/24   Roselyn Carlin NOVAK, MD  pantoprazole  (PROTONIX ) 40 MG tablet Take 1 tablet (40 mg total) by mouth 2 (two) times daily. 03/18/24   Heinz, Camie BRAVO, PA-C  promethazine  (PHENERGAN ) 25 MG tablet Take 25 mg by mouth. 02/22/22   [provider]  valACYclovir (VALTREX) 1000 MG tablet Take 1,000 mg by mouth. 05/01/22   [provider]  Past Surgical History Past Surgical History:  Procedure Laterality Date   BREAST BIOPSY     age 53   ESOPHAGOGASTRODUODENOSCOPY  05/2020   WISDOM TOOTH EXTRACTION     Family History Family History  Problem Relation Age of Onset   Stroke Mother    Hypertension Mother    Hyperlipidemia Mother    Colon cancer Neg Hx    Stomach cancer Neg Hx    Esophageal cancer Neg Hx     Social History Social History   Tobacco Use   Smoking status: Former    Current packs/day: 0.00    Types: Cigarettes    Quit date: 2003    Years since quitting: 22.5    Passive exposure: Current   Smokeless tobacco:  Never  Vaping Use   Vaping status: Never Used  Substance Use Topics   Alcohol use: Yes    Comment: socially   Drug use: Yes    Frequency: 2.0 times per week    Types: Marijuana   Allergies Coffee bean extract [coffea arabica], Soy allergy (obsolete), Tea, Yeast-derived drug products, and Chocolate  Review of Systems Review of Systems  Gastrointestinal:  Positive for abdominal pain, nausea and vomiting.  Genitourinary:  Positive for vaginal bleeding.    Physical Exam Vital Signs  I have reviewed the triage vital signs BP (!) 143/91   Pulse (!) 116   Temp 98.6 F (37 C)   Resp 13   Ht 5' 3 (1.6 m)   Wt 110 kg   LMP 04/09/2024 (Exact Date)   SpO2 100%   BMI 42.96 kg/m   Physical Exam Vitals and nursing note reviewed.  Constitutional:      General: She is not in acute distress.    Appearance: She is well-developed.  HENT:     Head: Normocephalic and atraumatic.  Eyes:     Conjunctiva/sclera: Conjunctivae normal.  Cardiovascular:     Rate and Rhythm: Regular rhythm. Tachycardia present.     Heart sounds: No murmur heard. Pulmonary:     Effort: Pulmonary effort is normal. No respiratory distress.     Breath sounds: Normal breath sounds.  Abdominal:     Palpations: Abdomen is soft.     Tenderness: There is generalized abdominal tenderness.  Musculoskeletal:        General: No swelling.     Cervical back: Neck supple.  Skin:    General: Skin is warm and dry.     Capillary Refill: Capillary refill takes less than 2 seconds.  Neurological:     Mental Status: She is alert.  Psychiatric:        Mood and Affect: Mood normal.     ED Results and Treatments Labs (all labs ordered are listed, but only abnormal results are displayed) Labs Reviewed  COMPREHENSIVE METABOLIC PANEL WITH GFR - Abnormal; Notable for the following components:      Result Value   Potassium 3.4 (*)    Chloride 96 (*)    Glucose, Bld 163 (*)    All other components within normal limits   CBC WITH DIFFERENTIAL/PLATELET - Abnormal; Notable for the following components:   WBC 12.9 (*)    Hemoglobin 8.7 (*)    HCT 27.9 (*)    MCV 70.3 (*)    MCH 21.9 (*)    RDW 22.0 (*)    Neutro Abs 10.2 (*)    Monocytes Absolute 1.1 (*)    All other components within normal limits  HCG, QUANTITATIVE, PREGNANCY  TYPE AND SCREEN                                                                                                                          Radiology No results found.  Pertinent labs & imaging results that were available during my care of the patient were reviewed by me and considered in my medical decision making (see MDM for details).  Medications Ordered in ED Medications  megestrol  (MEGACE ) tablet 40 mg (40 mg Oral Given 04/14/24 2137)  ondansetron  (ZOFRAN ) injection 4 mg (4 mg Intravenous Given 04/14/24 2135)  lactated ringers  bolus 1,000 mL (0 mLs Intravenous Stopped 04/14/24 2230)  morphine  (PF) 4 MG/ML injection 4 mg (4 mg Intravenous Given 04/14/24 2136)  lactated ringers  bolus 1,000 mL (0 mLs Intravenous Stopped 04/15/24 0038)  ondansetron  (ZOFRAN -ODT) disintegrating tablet 8 mg (8 mg Oral Given 04/15/24 0125)                                                                                                                                     Procedures .Critical Care  Performed by: Albertina Dixon, MD Authorized by: Albertina Dixon, MD   Critical care provider statement:    Critical care time (minutes):  30   Critical care was necessary to treat or prevent imminent or life-threatening deterioration of the following conditions:  Dehydration   Critical care was time spent personally by me on the following activities:  Development of treatment plan with patient or surrogate, discussions with consultants, evaluation of patient's response to treatment, examination of patient, ordering and review of laboratory studies, ordering and review of radiographic studies, ordering and  performing treatments and interventions, pulse oximetry, re-evaluation of patient's condition and review of old charts   (including critical care time)  Medical Decision Making / ED Course   This patient presents to the ED for concern of abdominal pain, nausea, vomiting, vaginal bleeding, this involves an extensive number of treatment options, and is a complaint that carries with it a high risk of complications and morbidity.  The differential diagnosis includes gastroparesis, gastroenteritis, abnormal uterine bleeding, uterine fibroids, gastritis, pancreatitis  MDM: Patient seen in the emergency department for evaluation of abdominal pain nausea vomiting and vaginal bleeding.  Physical exam with mild generalized tenderness to palpation but is otherwise unremarkable.  Laboratory evaluation with a leukocytosis to 12.9, hemoglobin 8.7 which is down from 10.04 weeks ago,  potassium 3.4, hCG negative.  ECG with sinus tachycardia.  Patient pain and nausea controlled with significant proved in the heart rate when she is sleeping.  Heart rate in the 80s when sleeping.  Patient started on Megace  for abnormal uterine bleeding.  Patient recently had CT imaging with the same complaints on 02/27/2024 and found uterine fibroids.  As patient states that this is exactly the same as her last presentation, I expect to find the same and thus CT imaging deferred in the ER at this time.  At time of signout, patient pending reevaluation by oncoming provider after second liter of fluid.  Please see provider signout note continuation of workup.   Additional history obtained:  -External records from outside source obtained and reviewed including: Chart review including previous notes, labs, imaging, consultation notes   Lab Tests: -I ordered, reviewed, and interpreted labs.   The pertinent results include:   Labs Reviewed  COMPREHENSIVE METABOLIC PANEL WITH GFR - Abnormal; Notable for the following components:       Result Value   Potassium 3.4 (*)    Chloride 96 (*)    Glucose, Bld 163 (*)    All other components within normal limits  CBC WITH DIFFERENTIAL/PLATELET - Abnormal; Notable for the following components:   WBC 12.9 (*)    Hemoglobin 8.7 (*)    HCT 27.9 (*)    MCV 70.3 (*)    MCH 21.9 (*)    RDW 22.0 (*)    Neutro Abs 10.2 (*)    Monocytes Absolute 1.1 (*)    All other components within normal limits  HCG, QUANTITATIVE, PREGNANCY  TYPE AND SCREEN      EKG   EKG Interpretation Date/Time:  Monday April 14 2024 21:14:14 EDT Ventricular Rate:  151 PR Interval:  112 QRS Duration:  64 QT Interval:  332 QTC Calculation: 526 R Axis:   48  Text Interpretation: Sinus tachycardia Nonspecific ST abnormality Abnormal ECG When compared with ECG of 27-Feb-2024 07:48, PREVIOUS ECG IS PRESENT Confirmed by Dorrell Mitcheltree ([id number]) on 04/14/2024 9:47:44 PM         Medicines ordered and prescription drug management: Meds ordered this encounter  Medications   ondansetron  (ZOFRAN ) injection 4 mg   lactated ringers  bolus 1,000 mL   morphine  (PF) 4 MG/ML injection 4 mg   megestrol  (MEGACE ) tablet 40 mg   lactated ringers  bolus 1,000 mL   megestrol  (MEGACE ) 40 MG tablet    Sig: Take 1 tablet (40 mg total) by mouth daily for 5 days.    Dispense:  5 tablet    Refill:  0   ondansetron  (ZOFRAN -ODT) 4 MG disintegrating tablet    Sig: Take 1 tablet (4 mg total) by mouth every 8 (eight) hours as needed for nausea or vomiting.    Dispense:  20 tablet    Refill:  0   ondansetron  (ZOFRAN -ODT) disintegrating tablet 8 mg    -I have reviewed the patients home medicines and have made adjustments as needed  Critical interventions Fluid resuscitation  Cardiac Monitoring: The patient was maintained on a cardiac monitor.  I personally viewed and interpreted the cardiac monitored which showed an underlying rhythm of: Sinus tachycardia, NSR  Social Determinants of Health:  Factors impacting patients care  include: none   Reevaluation: After the interventions noted above, I reevaluated the patient and found that they have :improved  Co morbidities that complicate the patient evaluation  Past Medical History:  Diagnosis Date   Anxiety and  depression 1995   Arthritis 2014   Asthma 1993   Uses inhaler prn   Diverticulosis    Esophagitis    H/O gastroesophageal reflux (GERD) 2013   Hiatal hernia    Hypokalemia    Migraines    Obesity    Seasonal allergies       Dispostion: I considered admission for this patient, and disposition pending reevaluation by oncoming provider.  Please see provider center note for continuation of workup.     Final Clinical Impression(s) / ED Diagnoses Final diagnoses:  Abnormal uterine bleeding  Sinus tachycardia     @PCDICTATION @    Shakenya Stoneberg, Lum, MD 04/15/24 [phone]

## 2024-04-14 NOTE — ED Provider Notes (Signed)
 Care assumed at shift change. Here for vaginal bleeding, Hgb not in need of transfusion, was mildly tachycardic at dispo, getting IVF and reassessment.  Physical Exam  BP 123/79   Pulse 91   Temp 98.6 F (37 C)   Resp (!) 29   Ht 5' 3 (1.6 m)   Wt 110 kg   LMP 04/09/2024 (Exact Date)   SpO2 100%   BMI 42.96 kg/m   Physical Exam  Procedures  Procedures  ED Course / MDM   Clinical Course as of 04/15/24 0057  Mon Apr 14, 2024  2116 Hemoglobin(!): 8.7 [MK]  Tue Apr 15, 2024  0054 Patient's HR improved with IVF, she is resting comfortably now. HR increases when talking about dispo, but she feels comfortable going home. Will continue Megace  per primary team plan. Recommend close outpatient Gyn follow up, RTED for any other concerns in the meantime.  [CS]    Clinical Course User Index [CS] Roselyn Carlin NOVAK, MD [MK] Kommor, Madison, MD   Medical Decision Making Problems Addressed: Abnormal uterine bleeding: chronic illness or injury with exacerbation, progression, or side effects of treatment Sinus tachycardia: acute illness or injury  Amount and/or Complexity of Data Reviewed Labs: ordered. Decision-making details documented in ED Course.  Risk Prescription drug management.          Roselyn Carlin NOVAK, MD 04/15/24 [phone]

## 2024-04-15 MED ORDER — MEGESTROL ACETATE 40 MG PO TABS: 40.0000 mg | ORAL_TABLET | Freq: Every day | ORAL | 0 refills | Status: AC

## 2024-04-15 MED ORDER — ONDANSETRON 4 MG PO TBDP: 4.0000 mg | ORAL_TABLET | Freq: Three times a day (TID) | ORAL | 0 refills | Status: DC | PRN

## 2024-04-15 MED ORDER — ONDANSETRON 4 MG PO TBDP
8.0000 mg | ORAL_TABLET | Freq: Once | ORAL | Status: AC
  Administered 2024-04-15: 8 mg via ORAL
  Filled 2024-04-15: qty 2

## 2024-05-09 ENCOUNTER — Ambulatory Visit

## 2024-05-09 ENCOUNTER — Telehealth (HOSPITAL_COMMUNITY): Payer: Self-pay | Admitting: Hematology and Oncology

## 2024-05-09 ENCOUNTER — Telehealth: Payer: Self-pay | Admitting: Family Medicine

## 2024-05-09 ENCOUNTER — Ambulatory Visit: Payer: Self-pay

## 2024-05-09 ENCOUNTER — Telehealth: Payer: Self-pay

## 2024-05-09 DIAGNOSIS — D500 Iron deficiency anemia secondary to blood loss (chronic): Secondary | ICD-10-CM

## 2024-05-09 DIAGNOSIS — N920 Excessive and frequent menstruation with regular cycle: Secondary | ICD-10-CM

## 2024-05-09 DIAGNOSIS — R1114 Bilious vomiting: Secondary | ICD-10-CM

## 2024-05-09 DIAGNOSIS — J302 Other seasonal allergic rhinitis: Secondary | ICD-10-CM

## 2024-05-09 LAB — POCT HEMOGLOBIN: Hemoglobin: 7.3 g/dL — AB (ref 11–14.6)

## 2024-05-09 MED ORDER — ONDANSETRON 4 MG PO TBDP: 4.0000 mg | ORAL_TABLET | Freq: Three times a day (TID) | ORAL | 0 refills | Status: DC | PRN

## 2024-05-09 MED ORDER — FLUTICASONE PROPIONATE 50 MCG/ACT NA SUSP: 2.0000 | Freq: Every day | NASAL | 6 refills | Status: AC

## 2024-05-09 NOTE — Assessment & Plan Note (Signed)
 Poorly controlled, takes p.o. iron sulfate supplements, last hemoglobin 04/14/2024 8.7.  Had a hemoglobin of 6.6 3 months ago and received 2 iron infusions.  Symptomatically worse, POC hemoglobin 7.3 in clinic, discussed that she could receive an transfusion if desired but preferred to wait for infusion next week.  Strict return precautions were given -IV iron infusion referral placed -Continue p.o. iron sulfate as well as diet rich in iron

## 2024-05-09 NOTE — Telephone Encounter (Signed)
 Auth Submission: NO AUTH NEEDED Site of care: Site of care: [facility] Payer: [employer] medicaid Medication & CPT/J Code(s) submitted: Feraheme (ferumoxytol ) [id number] Diagnosis Code:  Route of submission (phone, fax, portal):  Phone # Fax # Auth type: Buy/Bill PB Units/visits requested: 510mg  x 2 doses Reference number:  Approval from: 05/09/24 to 09/08/24

## 2024-05-09 NOTE — Telephone Encounter (Signed)
 Patient referred to infusion pharmacy team for ambulatory infusion of IV iron.  Insurance - [employer] Prepaid  Site of care - Site of care: [facility] Dx code - D50.0 IV Iron Therapy - Feraheme 510 mg IV x 2  Infusion appointments - Scheduling team will schedule patient as soon as possible.   Pria Klosinski D. Lamont Glasscock, PharmD

## 2024-05-09 NOTE — Assessment & Plan Note (Signed)
 Okay controlled, ED visit 04/14/2024 due to the vomiting.  Reports that it improves most with the Zofran , but also has Phenergan  at home. -Zofran  4 mg ODT refilled

## 2024-05-09 NOTE — Assessment & Plan Note (Signed)
 Poorly controlled, has been gradually improving since Nexplanon  was placed but still severe with other systemic symptoms of nausea and vomiting.  Was scheduled to get an endometrial biopsy with gynecologist as well as LEEP for  HSIL on last colposcopy/pap with Dr. Anders, but has not heard back from gynecologist.  Desires hysterectomy. -Referral to OB/GYN placed to discuss Total hysterectomy and Pap follow-up

## 2024-05-09 NOTE — Patient Instructions (Addendum)
 Thank you for visiting the clinic today, it was good to see you!  Please always bring your medication bottles  In today's visit we discussed:  Heavy periods: I placed a referral for you to see a gynecologist to discuss hysterectomy, I also refilled your Zofran , please carry this with you at all times to prevent nausea.  Low blood: I have placed an order for you to get an iron transfusion, they will contact you.  The transfusion takes 4 hours and will happen once sometime in the next week and another 2 weeks later.  Asthma: I have added Flonase  for you to use 2 puffs each nostril when you take your Zyrtec .  This will help improve control of your asthma.  Please follow-up in 3 months  For any questions, please call the office at [phone] or send me a message in MyChart. Have a great day!  -Fairy Amy, MD  [facility] Resident, PGY-1

## 2024-05-09 NOTE — Progress Notes (Signed)
    SUBJECTIVE:   CHIEF COMPLAINT / HPI:   IDA: History of 2 previous iron infusions, reports that symptoms have been worsening over the last week.  Most prominent symptoms include fatigue, dizziness, and lightheadedness, as well as getting short of breath when exerting herself.  Reports she cannot even go up a flight of stairs without feeling like her old lady and needing to take a break.  Denies rectal bleeding, or coughing up blood, though does endorse that she has nausea and vomiting with her periods, and when vomiting more than 7-8 times there are small flecks of blood.  Had ED visit 04/14/2024 for emesis and abdominal pain, hemoglobin then was 8.7.  Denies fevers, chills, constipation, diarrhea, recent weight loss, chest pain.  Menorrhagia: History of heavy periods, usually heavy bleeding passing palm-sized clots for first 2 days, decreases for the following 5 to 7 days.  Since receiving Nexplanon  implant, reports high blood for the 4 weeks straight. Bleeding has decreased by about a week each menstrual cycle after about, last cycle she bled heavily passing palm-sized clots and bleeding through a liner twice a day for 10 days.  Also endorses moderate cramps, and nausea and vomiting with periods.  Asthma: Has not had a hospitalization secondary to asthma for over a year, triggers are seasonal allergies in the fall and spring and fumes from cooking.  Reports using her inhaler 1-2 times a month over the summer and more when her allergies are bad.  PERTINENT  PMH / PSH: GERD  OBJECTIVE:   BP 108/69   Pulse 87   Ht 5' 3 (1.6 m)   Wt 251 lb 6.4 oz (114 kg)   LMP 04/09/2024 (Exact Date)   SpO2 100%   BMI 44.53 kg/m    Constitutional: fatigued, well-appearing, pale conjunctiva Cardiac: Regular rate and rhythm. Normal S1/S2. No murmurs, rubs, or gallops appreciated. Lungs: Clear bilaterally to ascultation.  Abdomen: Normoactive bowel sounds. No tenderness to deep or light palpation. No  rebound or guarding.   Psych: Pleasant and appropriate    ASSESSMENT/PLAN:   Assessment & Plan Iron deficiency anemia due to chronic blood loss Poorly controlled, takes p.o. iron sulfate supplements, last hemoglobin 04/14/2024 8.7.  Had a hemoglobin of 6.6 3 months ago and received 2 iron infusions.  Symptomatically worse, POC hemoglobin 7.3 in clinic, discussed that she could receive an transfusion if desired but preferred to wait for infusion next week.  Strict return precautions were given -IV iron infusion referral placed -Continue p.o. iron sulfate as well as diet rich in iron Menorrhagia with regular cycle Poorly controlled, has been gradually improving since Nexplanon  was placed but still severe with other systemic symptoms of nausea and vomiting.  Was scheduled to get an endometrial biopsy with gynecologist as well as LEEP for  HSIL on last colposcopy/pap with Dr. Anders, but has not heard back from gynecologist.  Desires hysterectomy. -Referral to OB/GYN placed to discuss Total hysterectomy and Pap follow-up Bilious vomiting with nausea Okay controlled, ED visit 04/14/2024 due to the vomiting.  Reports that it improves most with the Zofran , but also has Phenergan  at home. -Zofran  4 mg ODT refilled Seasonal allergies Known trigger for her asthma, worse in the fall in the spring, moderately well-controlled with Zyrtec  but has not tried Flonase  -Flonase  2 sprays each nostril daily     Fairy Amy, MD [facility]

## 2024-05-13 ENCOUNTER — Ambulatory Visit

## 2024-05-13 VITALS — BP 95/64 | HR 86 | Temp 98.4°F | Resp 18 | Ht 63.0 in | Wt 252.8 lb

## 2024-05-13 DIAGNOSIS — D500 Iron deficiency anemia secondary to blood loss (chronic): Secondary | ICD-10-CM | POA: Diagnosis not present

## 2024-05-13 DIAGNOSIS — N920 Excessive and frequent menstruation with regular cycle: Secondary | ICD-10-CM | POA: Diagnosis not present

## 2024-05-13 MED ORDER — FERUMOXYTOL INJECTION 510 MG/17 ML
510.0000 mg | Freq: Once | INTRAVENOUS | Status: AC
  Administered 2024-05-13: 510 mg via INTRAVENOUS
  Filled 2024-05-13: qty 17

## 2024-05-13 NOTE — Progress Notes (Signed)
 Diagnosis: Iron Deficiency Anemia  Provider:  Praveen Mannam MD  Procedure: IV Infusion  IV Type: Peripheral, IV Location: R Antecubital  Feraheme (Ferumoxytol ), Dose: 510 mg  Infusion Start Time: 1549  Infusion Stop Time: 1610  Post Infusion IV Care: Patient declined observation and Peripheral IV Discontinued  Discharge: Condition: Good, Destination: Home . AVS Declined  Performed by:  Maximiano JONELLE Pouch, LPN

## 2024-05-20 ENCOUNTER — Ambulatory Visit

## 2024-05-22 ENCOUNTER — Ambulatory Visit (INDEPENDENT_AMBULATORY_CARE_PROVIDER_SITE_OTHER)

## 2024-05-22 VITALS — BP 114/71 | HR 64 | Temp 98.1°F | Resp 12 | Ht 63.0 in | Wt 249.4 lb

## 2024-05-22 DIAGNOSIS — N920 Excessive and frequent menstruation with regular cycle: Secondary | ICD-10-CM

## 2024-05-22 DIAGNOSIS — D500 Iron deficiency anemia secondary to blood loss (chronic): Secondary | ICD-10-CM

## 2024-05-22 MED ORDER — SODIUM CHLORIDE 0.9 % IV SOLN
510.0000 mg | Freq: Once | INTRAVENOUS | Status: AC
  Administered 2024-05-22: 510 mg via INTRAVENOUS
  Filled 2024-05-22: qty 17

## 2024-05-22 NOTE — Progress Notes (Signed)
 Diagnosis: Acute Anemia  Provider:  Mannam, Praveen MD  Procedure: IV Infusion  IV Type: Peripheral, IV Location: R Antecubital  Feraheme (Ferumoxytol), Dose: 510 mg  Infusion Start Time: 1306  Infusion Stop Time: 1324  Post Infusion IV Care: Patient declined observation and Peripheral IV Discontinued  Discharge: Condition: Stable, Destination: Home . AVS Provided  Performed by:  Rocky FORBES Sar, RN

## 2024-05-28 ENCOUNTER — Ambulatory Visit: Payer: Self-pay | Admitting: Gastroenterology

## 2024-06-05 ENCOUNTER — Emergency Department (HOSPITAL_BASED_OUTPATIENT_CLINIC_OR_DEPARTMENT_OTHER)
Admission: EM | Admit: 2024-06-05 | Discharge: 2024-06-05 | Disposition: A | Discharge status: Home / Self Care | Attending: Emergency Medicine | Admitting: Emergency Medicine

## 2024-06-05 ENCOUNTER — Emergency Department (HOSPITAL_BASED_OUTPATIENT_CLINIC_OR_DEPARTMENT_OTHER)

## 2024-06-05 ENCOUNTER — Encounter (HOSPITAL_BASED_OUTPATIENT_CLINIC_OR_DEPARTMENT_OTHER): Payer: Self-pay | Admitting: Emergency Medicine

## 2024-06-05 ENCOUNTER — Other Ambulatory Visit: Payer: Self-pay

## 2024-06-05 DIAGNOSIS — E876 Hypokalemia: Secondary | ICD-10-CM | POA: Insufficient documentation

## 2024-06-05 DIAGNOSIS — D72829 Elevated white blood cell count, unspecified: Secondary | ICD-10-CM | POA: Diagnosis not present

## 2024-06-05 DIAGNOSIS — R1084 Generalized abdominal pain: Secondary | ICD-10-CM | POA: Insufficient documentation

## 2024-06-05 DIAGNOSIS — E860 Dehydration: Secondary | ICD-10-CM | POA: Insufficient documentation

## 2024-06-05 DIAGNOSIS — R112 Nausea with vomiting, unspecified: Secondary | ICD-10-CM | POA: Insufficient documentation

## 2024-06-05 LAB — COMPREHENSIVE METABOLIC PANEL WITH GFR
ALT: 14 U/L (ref 0–44)
AST: 19 U/L (ref 15–41)
Albumin: 4.8 g/dL (ref 3.5–5.0)
Alkaline Phosphatase: 69 U/L (ref 38–126)
Anion gap: 20 — ABNORMAL HIGH (ref 5–15)
BUN: 8 mg/dL (ref 6–20)
CO2: 18 mmol/L — ABNORMAL LOW (ref 22–32)
Calcium: 10.3 mg/dL (ref 8.9–10.3)
Chloride: 101 mmol/L (ref 98–111)
Creatinine, Ser: 0.81 mg/dL (ref 0.44–1.00)
GFR, Estimated: >60 mL/min (ref >60)
Glucose, Bld: 163 mg/dL — ABNORMAL HIGH (ref 70–99)
Potassium: 3.2 mmol/L — ABNORMAL LOW (ref 3.5–5.1)
Sodium: 140 mmol/L (ref 135–145)
Total Bilirubin: 0.4 mg/dL (ref 0.0–1.2)
Total Protein: 8.5 g/dL — ABNORMAL HIGH (ref 6.5–8.1)

## 2024-06-05 LAB — MAGNESIUM: Magnesium: 1.6 mg/dL — ABNORMAL LOW (ref 1.7–2.4)

## 2024-06-05 LAB — URINALYSIS, ROUTINE W REFLEX MICROSCOPIC
Bacteria, UA: NONE SEEN
Bilirubin Urine: NEGATIVE
Glucose, UA: NEGATIVE mg/dL
Ketones, ur: 40 mg/dL — AB
Leukocytes,Ua: NEGATIVE
Nitrite: NEGATIVE
Protein, ur: 100 mg/dL — AB
Specific Gravity, Urine: >1.046 — ABNORMAL HIGH (ref 1.005–1.030)
pH: 8 (ref 5.0–8.0)

## 2024-06-05 LAB — CBC
HCT: 38.5 % (ref 36.0–46.0)
Hemoglobin: 11.8 g/dL — ABNORMAL LOW (ref 12.0–15.0)
MCH: 22.5 pg — ABNORMAL LOW (ref 26.0–34.0)
MCHC: 30.6 g/dL (ref 30.0–36.0)
MCV: 73.5 fL — ABNORMAL LOW (ref 80.0–100.0)
Platelets: 330 K/uL (ref 150–400)
RBC: 5.24 MIL/uL — ABNORMAL HIGH (ref 3.87–5.11)
RDW: 28 % — ABNORMAL HIGH (ref 11.5–15.5)
WBC: 12.2 K/uL — ABNORMAL HIGH (ref 4.0–10.5)
nRBC: 0 % (ref 0.0–0.2)

## 2024-06-05 LAB — HCG, SERUM, QUALITATIVE: Preg, Serum: NEGATIVE

## 2024-06-05 LAB — LIPASE, BLOOD: Lipase: 19 U/L (ref 11–51)

## 2024-06-05 MED ORDER — IOHEXOL 300 MG/ML  SOLN
100.0000 mL | Freq: Once | INTRAMUSCULAR | Status: AC | PRN
  Administered 2024-06-05: 100 mL via INTRAVENOUS

## 2024-06-05 MED ORDER — LIDOCAINE VISCOUS HCL 2 % MT SOLN
15.0000 mL | Freq: Once | OROMUCOSAL | Status: AC
  Administered 2024-06-05: 15 mL via ORAL
  Filled 2024-06-05: qty 15

## 2024-06-05 MED ORDER — SODIUM CHLORIDE 0.9 % IV BOLUS
1000.0000 mL | Freq: Once | INTRAVENOUS | Status: AC
  Administered 2024-06-05: 1000 mL via INTRAVENOUS

## 2024-06-05 MED ORDER — DICYCLOMINE HCL 20 MG PO TABS: 20.0000 mg | ORAL_TABLET | Freq: Two times a day (BID) | ORAL | 0 refills | Status: DC

## 2024-06-05 MED ORDER — DIPHENHYDRAMINE HCL 50 MG/ML IJ SOLN
25.0000 mg | Freq: Once | INTRAMUSCULAR | Status: AC
  Administered 2024-06-05: 25 mg via INTRAVENOUS
  Filled 2024-06-05: qty 1

## 2024-06-05 MED ORDER — POTASSIUM CHLORIDE CRYS ER 20 MEQ PO TBCR
40.0000 meq | EXTENDED_RELEASE_TABLET | Freq: Once | ORAL | Status: AC
  Administered 2024-06-05: 40 meq via ORAL
  Filled 2024-06-05: qty 2

## 2024-06-05 MED ORDER — ALUM & MAG HYDROXIDE-SIMETH 200-200-20 MG/5ML PO SUSP
30.0000 mL | Freq: Once | ORAL | Status: AC
  Administered 2024-06-05: 30 mL via ORAL
  Filled 2024-06-05: qty 30

## 2024-06-05 MED ORDER — METOCLOPRAMIDE HCL 5 MG/ML IJ SOLN
5.0000 mg | Freq: Once | INTRAMUSCULAR | Status: AC
  Administered 2024-06-05: 5 mg via INTRAVENOUS
  Filled 2024-06-05: qty 2

## 2024-06-05 MED ORDER — PANTOPRAZOLE SODIUM 40 MG IV SOLR
40.0000 mg | Freq: Once | INTRAVENOUS | Status: AC
  Administered 2024-06-05: 40 mg via INTRAVENOUS
  Filled 2024-06-05: qty 10

## 2024-06-05 MED ORDER — MORPHINE SULFATE (PF) 4 MG/ML IV SOLN
4.0000 mg | Freq: Once | INTRAVENOUS | Status: AC
  Administered 2024-06-05: 4 mg via INTRAVENOUS
  Filled 2024-06-05: qty 1

## 2024-06-05 MED ORDER — MAGNESIUM SULFATE 2 GM/50ML IV SOLN
2.0000 g | Freq: Once | INTRAVENOUS | Status: AC
  Administered 2024-06-05: 2 g via INTRAVENOUS
  Filled 2024-06-05: qty 50

## 2024-06-05 MED ORDER — ONDANSETRON 4 MG PO TBDP
4.0000 mg | ORAL_TABLET | Freq: Three times a day (TID) | ORAL | 0 refills | Status: AC | PRN
Start: 2024-06-05 — End: ?

## 2024-06-05 MED ORDER — ONDANSETRON HCL 4 MG/2ML IJ SOLN
4.0000 mg | Freq: Once | INTRAMUSCULAR | Status: AC
  Administered 2024-06-05: 4 mg via INTRAVENOUS
  Filled 2024-06-05: qty 2

## 2024-06-05 NOTE — ED Notes (Signed)
Pt is aware urine sample is needed. 

## 2024-06-05 NOTE — ED Triage Notes (Signed)
 Pt via pov from home with emesis today, as well as abdominal pain. Pt states she awakened this morning with the emesis. Pt a&o x4; nad noted.

## 2024-06-05 NOTE — ED Notes (Signed)
 Pt currently unable to provide urine sample. Specimen cup at bedside.

## 2024-06-05 NOTE — ED Provider Notes (Signed)
 [place] EMERGENCY DEPARTMENT AT [facility] Provider Note   CSN: [id number] Arrival date & time: 06/05/24  1805     Patient presents with: Emesis   Lynn Wilcox is a 43 y.o. female.   Patient is a 43 year old female who presents emergency department for chief complaint of diffuse abdominal pain, nausea, vomiting which has been ongoing since this morning.  Patient notes that she has recurrent issues of this.  She notes that she does feel dehydrated at this point.  She denies any diarrhea or constipation.  She denies any dysuria or hematuria.  She denies any chest pain, abdominal pain.  She does note that she is experiencing acid reflux type symptoms.   Emesis      Prior to Admission medications   Medication Sig Start Date End Date Taking? Authorizing Provider  albuterol  (VENTOLIN  HFA) 108 (90 Base) MCG/ACT inhaler Inhale 2 puffs into the lungs. 10/26/22   [provider]  cetirizine  (ZYRTEC ) 10 MG tablet TAKE 1 TABLET BY MOUTH DAILY 07/07/20   Dayna Motto, DO  ferrous sulfate  324 (65 Fe) MG TBEC Take 1 tablet (325 mg total) by mouth every other day. 05/03/23   Joshua Domino, DO  fluticasone  (FLONASE ) 50 MCG/ACT nasal spray Place 2 sprays into both nostrils daily. 05/09/24   Lorrane Pac, MD  ondansetron  (ZOFRAN -ODT) 4 MG disintegrating tablet Take 1 tablet (4 mg total) by mouth every 8 (eight) hours as needed for nausea or vomiting. 05/09/24   Lorrane Pac, MD  pantoprazole  (PROTONIX ) 40 MG tablet Take 1 tablet (40 mg total) by mouth 2 (two) times daily. 03/18/24   Heinz, Camie BRAVO, PA-C  promethazine  (PHENERGAN ) 25 MG tablet Take 25 mg by mouth. 02/22/22   [provider]    Allergies: Coffee bean extract [coffea arabica], Soy allergy (obsolete), Tea, Yeast-derived drug products, and Chocolate    Review of Systems  Gastrointestinal:  Positive for vomiting.  All other systems reviewed and are negative.   Updated Vital Signs BP (!) 158/130 (BP Location:  Right Arm)   Pulse 89   Temp 98.1 F (36.7 C)   Resp 16   Ht 5' 3 (1.6 m)   Wt 113.1 kg   LMP 05/18/2024 (Exact Date)   SpO2 100%   BMI 44.17 kg/m   Physical Exam Vitals and nursing note reviewed.  Constitutional:      Appearance: Normal appearance. She is not ill-appearing.  HENT:     Head: Normocephalic and atraumatic.     Nose: Nose normal.     Mouth/Throat:     Mouth: Mucous membranes are moist.  Eyes:     Extraocular Movements: Extraocular movements intact.     Conjunctiva/sclera: Conjunctivae normal.     Pupils: Pupils are equal, round, and reactive to light.  Cardiovascular:     Rate and Rhythm: Normal rate and regular rhythm.     Pulses: Normal pulses.     Heart sounds: Normal heart sounds. No murmur heard.    No gallop.  Pulmonary:     Effort: Pulmonary effort is normal. No respiratory distress.     Breath sounds: Normal breath sounds. No stridor. No wheezing, rhonchi or rales.  Abdominal:     General: Abdomen is flat. Bowel sounds are normal. There is no distension.     Palpations: Abdomen is soft.     Tenderness: There is no guarding.     Comments: Mild epigastric tenderness  Musculoskeletal:        General: Normal range  of motion.     Cervical back: Normal range of motion and neck supple.  Skin:    General: Skin is warm and dry.     Findings: No bruising or rash.  Neurological:     General: No focal deficit present.     Mental Status: She is alert and oriented to person, place, and time. Mental status is at baseline.  Psychiatric:        Mood and Affect: Mood normal.        Behavior: Behavior normal.        Thought Content: Thought content normal.        Judgment: Judgment normal.     (all labs ordered are listed, but only abnormal results are displayed) Labs Reviewed  LIPASE, BLOOD  COMPREHENSIVE METABOLIC PANEL WITH GFR  CBC  URINALYSIS, ROUTINE W REFLEX MICROSCOPIC  PREGNANCY, URINE    EKG: None  Radiology: No results  found.   Procedures   Medications Ordered in the ED  ondansetron  (ZOFRAN ) injection 4 mg (has no administration in time range)  morphine  (PF) 4 MG/ML injection 4 mg (has no administration in time range)  pantoprazole  (PROTONIX ) injection 40 mg (has no administration in time range)  sodium chloride  0.9 % bolus 1,000 mL (has no administration in time range)  alum & mag hydroxide-simeth (MAALOX/MYLANTA) 200-200-20 MG/5ML suspension 30 mL (has no administration in time range)    And  lidocaine  (XYLOCAINE ) 2 % viscous mouth solution 15 mL (has no administration in time range)                                    Medical Decision Making Amount and/or Complexity of Data Reviewed Labs: ordered. Radiology: ordered.  Risk OTC drugs. Prescription drug management.   This patient presents to the ED for concern of abdominal pain, nausea, vomiting differential diagnosis includes acute appendicitis, cholecystitis, bowel torsion, diverticulitis, ovarian torsion or cyst, PID, tubo-ovarian abscess, pyelonephritis, kidney stone, pancreatitis, mesenteric ischemia, colitis    Additional history obtained:  Additional history obtained from family External records from outside source obtained and reviewed including medical records   Lab Tests:  I Ordered, and personally interpreted labs.  The pertinent results include: Mild leukocytosis at baseline, anemia at baseline, mild hypokalemia, normal kidney function liver function, hypomagnesemia, unremarkable urinalysis, negative lipase   Imaging Studies ordered:  I ordered imaging studies including CT scan abdomen and pelvis I independently visualized and interpreted imaging which showed colitis I agree with the radiologist interpretation   Medicines ordered and prescription drug management:  I ordered medication including Reglan , Benadryl , morphine , Zofran , IV fluids, Protonix , GI cocktail, magnesium , potassium for abdominal pain, nausea,  vomiting, hypokalemia, hypomagnesemia Reevaluation of the patient after these medicines showed that the patient improved I have reviewed the patients home medicines and have made adjustments as needed   Problem List / ED Course:  Patient is doing well at this time and is stable for discharge home.  Symptoms have greatly improved with treatment in the emergency department.  She has had no further nausea or vomiting and is tolerating p.o. intake.  Discussed with patient that CT scan of abdomen and pelvis was consistent with colitis.  Will continue symptomatic treatment outpatient basis recommend close follow-up with her primary care doctor.  No surgical process was noted on CT scan of the abdomen and pelvis.  Blood work is otherwise been unremarkable.  Potassium and magnesium   were repleted in the emergency department.  Vital signs are stable with no indication for sepsis.  Urinalysis is unremarkable and do not suspect urinary tract infection.  Strict turn precautions were provided for any new or worsening symptoms.  Patient voiced understand to the plan and had no additional questions.   Social Determinants of Health:  None        Final diagnoses:  None    ED Discharge Orders     None          Daralene Lonni JONETTA DEVONNA 06/05/24 2243    Armenta Canning, MD 06/06/24 [phone]

## 2024-06-05 NOTE — ED Notes (Signed)
 Patient transported to CT

## 2024-06-06 ENCOUNTER — Emergency Department (HOSPITAL_BASED_OUTPATIENT_CLINIC_OR_DEPARTMENT_OTHER)

## 2024-06-06 ENCOUNTER — Encounter (HOSPITAL_BASED_OUTPATIENT_CLINIC_OR_DEPARTMENT_OTHER): Payer: Self-pay

## 2024-06-06 ENCOUNTER — Other Ambulatory Visit: Payer: Self-pay

## 2024-06-06 ENCOUNTER — Emergency Department (HOSPITAL_BASED_OUTPATIENT_CLINIC_OR_DEPARTMENT_OTHER)
Admission: EM | Admit: 2024-06-06 | Discharge: 2024-06-07 | Disposition: A | Discharge status: Home / Self Care | Attending: Emergency Medicine | Admitting: Emergency Medicine

## 2024-06-06 DIAGNOSIS — D649 Anemia, unspecified: Secondary | ICD-10-CM | POA: Diagnosis not present

## 2024-06-06 DIAGNOSIS — N939 Abnormal uterine and vaginal bleeding, unspecified: Secondary | ICD-10-CM | POA: Insufficient documentation

## 2024-06-06 LAB — CBC
HCT: 34 % — ABNORMAL LOW (ref 36.0–46.0)
Hemoglobin: 10.3 g/dL — ABNORMAL LOW (ref 12.0–15.0)
MCH: 22.4 pg — ABNORMAL LOW (ref 26.0–34.0)
MCHC: 30.3 g/dL (ref 30.0–36.0)
MCV: 74.1 fL — ABNORMAL LOW (ref 80.0–100.0)
Platelets: 277 K/uL (ref 150–400)
RBC: 4.59 MIL/uL (ref 3.87–5.11)
RDW: 27.9 % — ABNORMAL HIGH (ref 11.5–15.5)
WBC: 9.6 K/uL (ref 4.0–10.5)
nRBC: 0 % (ref 0.0–0.2)

## 2024-06-06 LAB — MAGNESIUM: Magnesium: 1.9 mg/dL (ref 1.7–2.4)

## 2024-06-06 LAB — BASIC METABOLIC PANEL WITH GFR
Anion gap: 16 — ABNORMAL HIGH (ref 5–15)
BUN: 7 mg/dL (ref 6–20)
CO2: 20 mmol/L — ABNORMAL LOW (ref 22–32)
Calcium: 9.2 mg/dL (ref 8.9–10.3)
Chloride: 104 mmol/L (ref 98–111)
Creatinine, Ser: 0.79 mg/dL (ref 0.44–1.00)
GFR, Estimated: >60 mL/min (ref >60)
Glucose, Bld: 145 mg/dL — ABNORMAL HIGH (ref 70–99)
Potassium: 3 mmol/L — ABNORMAL LOW (ref 3.5–5.1)
Sodium: 139 mmol/L (ref 135–145)

## 2024-06-06 MED ORDER — MORPHINE SULFATE (PF) 4 MG/ML IV SOLN
4.0000 mg | Freq: Once | INTRAVENOUS | Status: AC
  Administered 2024-06-06: 4 mg via INTRAVENOUS
  Filled 2024-06-06: qty 1

## 2024-06-06 MED ORDER — DROPERIDOL 2.5 MG/ML IJ SOLN
1.2500 mg | Freq: Once | INTRAMUSCULAR | Status: AC
  Administered 2024-06-06: 1.25 mg via INTRAVENOUS
  Filled 2024-06-06: qty 2

## 2024-06-06 MED ORDER — KETOROLAC TROMETHAMINE 15 MG/ML IJ SOLN
15.0000 mg | Freq: Once | INTRAMUSCULAR | Status: AC
  Administered 2024-06-06: 15 mg via INTRAVENOUS
  Filled 2024-06-06: qty 1

## 2024-06-06 MED ORDER — MEDROXYPROGESTERONE ACETATE 150 MG/ML IM SUSP
300.0000 mg | Freq: Once | INTRAMUSCULAR | Status: AC
  Administered 2024-06-07: 300 mg via INTRAMUSCULAR
  Filled 2024-06-06: qty 2

## 2024-06-06 MED ORDER — PROMETHAZINE HCL 25 MG RE SUPP: 25.0000 mg | Freq: Four times a day (QID) | RECTAL | 0 refills | Status: DC | PRN

## 2024-06-06 MED ORDER — LORAZEPAM 2 MG/ML IJ SOLN
1.0000 mg | Freq: Once | INTRAMUSCULAR | Status: AC
  Administered 2024-06-06: 1 mg via INTRAVENOUS
  Filled 2024-06-06: qty 1

## 2024-06-06 MED ORDER — MEGESTROL ACETATE 400 MG/10ML PO SUSP: 1200.0000 mg | Freq: Every day | ORAL | 0 refills | Status: DC

## 2024-06-06 MED ORDER — POTASSIUM CHLORIDE CRYS ER 20 MEQ PO TBCR
40.0000 meq | EXTENDED_RELEASE_TABLET | Freq: Once | ORAL | Status: AC
  Administered 2024-06-07: 40 meq via ORAL
  Filled 2024-06-06: qty 2

## 2024-06-06 MED ORDER — SODIUM CHLORIDE 0.9 % IV BOLUS
1000.0000 mL | Freq: Once | INTRAVENOUS | Status: AC
  Administered 2024-06-06: 1000 mL via INTRAVENOUS

## 2024-06-06 NOTE — ED Triage Notes (Signed)
 Pt c/o vaginal bleeding a lot, passing hand-sized clots x hours. Advises she was seen last night for abd pain/ burning/ GI symptoms, that's good now, I'm just bleeding. LMP 8/10, Just got off a couple days ago   Pt appears anxious during triage

## 2024-06-06 NOTE — ED Notes (Signed)
Pt actively vomiting at this time.

## 2024-06-06 NOTE — ED Notes (Signed)
 Pt ambulatory to RR & pt room w no assistance from staff

## 2024-06-06 NOTE — ED Notes (Signed)
 ED Provider at bedside.

## 2024-06-06 NOTE — Discharge Instructions (Addendum)
 As discussed, I have talked to the OB/GYN you recommended a birth control medication called Megace .  Please take as directed.  Will send a referral into OB/GYN as well to be seen more quickly.  Please do not hesitate to return to the emergency department if the worrisome signs and symptoms we discussed become apparent.

## 2024-06-06 NOTE — ED Notes (Signed)
 Pt is aware medication ordered will be couriered over from main pharmacy. Pt is willing to wait for medication to arrive.

## 2024-06-06 NOTE — ED Provider Notes (Signed)
 [place] EMERGENCY DEPARTMENT AT [facility] Provider Note   CSN: [id number] Arrival date & time: 06/06/24  2001     Patient presents with: Vaginal Bleeding and Shortness of Breath   Lynn Wilcox is a 43 y.o. female.    Vaginal Bleeding Shortness of Breath   43 year old female presents emergency department with complaints of lower abdominal pain, vaginal bleeding.  States that symptoms began around 6 PM this evening.  Patient was recently seen in the emergency department yesterday for nausea, vomiting, abdominal pain and had CT imaging that was reassuring.  States that her symptoms from that visit are generally better but now is having more pelvic pain.  Denies any fevers, chills, urinary symptoms, change in bowel habits, vaginal discharge.  Patient does have a Nexplanon  in place.  Past medical history significant for GERD, hypokalemia, anxiety/depression, diverticulosis  Prior to Admission medications   Medication Sig Start Date End Date Taking? Authorizing Provider  albuterol  (VENTOLIN  HFA) 108 (90 Base) MCG/ACT inhaler Inhale 2 puffs into the lungs. 10/26/22   [provider]  cetirizine  (ZYRTEC ) 10 MG tablet TAKE 1 TABLET BY MOUTH DAILY 07/07/20   Dayna Motto, DO  dicyclomine  (BENTYL ) 20 MG tablet Take 1 tablet (20 mg total) by mouth 2 (two) times daily. 06/05/24   Daralene Lonni BIRCH, PA-C  ferrous sulfate  324 (65 Fe) MG TBEC Take 1 tablet (325 mg total) by mouth every other day. 05/03/23   Joshua Domino, DO  fluticasone  (FLONASE ) 50 MCG/ACT nasal spray Place 2 sprays into both nostrils daily. 05/09/24   Lorrane Pac, MD  ondansetron  (ZOFRAN -ODT) 4 MG disintegrating tablet Take 1 tablet (4 mg total) by mouth every 8 (eight) hours as needed for nausea or vomiting. 06/05/24   Daralene Lonni D, PA-C  pantoprazole  (PROTONIX ) 40 MG tablet Take 1 tablet (40 mg total) by mouth 2 (two) times daily. 03/18/24   Heinz, Camie BRAVO, PA-C  promethazine  (PHENERGAN ) 25 MG  tablet Take 25 mg by mouth. 02/22/22   [provider]    Allergies: Coffee bean extract [coffea arabica], Soy allergy (obsolete), Tea, Yeast-derived drug products, and Chocolate    Review of Systems  Respiratory:  Positive for shortness of breath.   Genitourinary:  Positive for vaginal bleeding.  All other systems reviewed and are negative.   Updated Vital Signs BP (!) 134/101   Pulse 89   Temp 99.1 F (37.3 C) (Oral)   Resp 14   LMP 05/18/2024 (Exact Date)   SpO2 97%   Physical Exam Vitals and nursing note reviewed. Exam conducted with a chaperone present.  Constitutional:      General: She is not in acute distress.    Appearance: She is well-developed.     Comments: Very anxious appearing  HENT:     Head: Normocephalic and atraumatic.  Eyes:     Conjunctiva/sclera: Conjunctivae normal.  Cardiovascular:     Rate and Rhythm: Normal rate and regular rhythm.     Heart sounds: No murmur heard. Pulmonary:     Effort: Pulmonary effort is normal. No respiratory distress.     Breath sounds: Normal breath sounds.  Abdominal:     Palpations: Abdomen is soft.     Tenderness: There is abdominal tenderness in the suprapubic area.  Genitourinary:    Comments: Pelvic exam performed with female nursing staff at bedside.  Patient reportedly had just changed her diaper.  Large clot appreciated at opening of vagina.  Area removed.  Inserted speculum with almost half full  vaginal vault of red blood with clots presents Musculoskeletal:        General: No swelling.     Cervical back: Neck supple.  Skin:    General: Skin is warm and dry.     Capillary Refill: Capillary refill takes less than 2 seconds.  Neurological:     Mental Status: She is alert.  Psychiatric:        Mood and Affect: Mood normal.     (all labs ordered are listed, but only abnormal results are displayed) Labs Reviewed  CBC  BASIC METABOLIC PANEL WITH GFR  MAGNESIUM     EKG: None  Radiology: CT  ABDOMEN PELVIS W CONTRAST Result Date: 06/05/2024 CLINICAL DATA:  Abdominal pain and vomiting. EXAM: CT ABDOMEN AND PELVIS WITH CONTRAST TECHNIQUE: Multidetector CT imaging of the abdomen and pelvis was performed using the standard protocol following bolus administration of intravenous contrast. RADIATION DOSE REDUCTION: This exam was performed according to the departmental dose-optimization program which includes automated exposure control, adjustment of the mA and/or kV according to patient size and/or use of iterative reconstruction technique. CONTRAST:  OMNIPAQUE  IOHEXOL  300 MG/ML  SOLN COMPARISON:  Feb 27, 2024 FINDINGS: Lower chest: No acute abnormality. Hepatobiliary: No focal liver abnormality is seen. No gallstones, gallbladder wall thickening, or biliary dilatation. Pancreas: Unremarkable. No pancreatic ductal dilatation or surrounding inflammatory changes. Spleen: Normal in size without focal abnormality. Adrenals/Urinary Tract: Adrenal glands are unremarkable. Kidneys are normal, without renal calculi, focal lesion, or hydronephrosis. The urinary bladder is poorly distended and subsequently limited in evaluation. Stomach/Bowel: There is a small hiatal hernia. Appendix appears normal. No evidence of bowel dilatation. Mild colonic wall thickening is seen throughout the ascending and transverse colon. Vascular/Lymphatic: No significant vascular findings are present. No enlarged abdominal or pelvic lymph nodes. Reproductive: The uterus is mildly heterogeneous in appearance. The bilateral adnexa are unremarkable. Other: A 3.3 cm x 2.6 cm x 3.4 cm fat containing umbilical hernia is noted. No abdominopelvic ascites. Musculoskeletal: No acute or significant osseous findings. IMPRESSION: 1. Mild colonic wall thickening throughout the ascending and transverse colon, which may represent mild colitis. 2. Small hiatal hernia. 3. Fat-containing umbilical hernia. Electronically Signed   By: Suzen Dials  M.D.   On: 06/05/2024 19:52     Procedures   Medications Ordered in the ED  LORazepam  (ATIVAN ) injection 1 mg (1 mg Intravenous Given 06/06/24 2058)  sodium chloride  0.9 % bolus 1,000 mL (1,000 mLs Intravenous New Bag/Given 06/06/24 2103)  ketorolac  (TORADOL ) 15 MG/ML injection 15 mg (15 mg Intravenous Given 06/06/24 2059)    Clinical Course as of 06/06/24 2354  Fri Jun 06, 2024  2305 Consulted Dr. Jayne of OB/GYN regarding the patient.  Recommended 300 mg IM of Depo-Provera  with 1200 mg daily of menstrual given patient's current vaginal bleeding with knowledge of patient having Nexplanon  in place.  Deproprovera 300im Liquid megestrol  280cc - 3cc daily (120mg  daily) [CR]    Clinical Course User Index [CR] Lynn Wonda LABOR, PA                                 Medical Decision Making Amount and/or Complexity of Data Reviewed Labs: ordered. Radiology: ordered.  Risk Prescription drug management.   This patient presents to the ED for concern of pelvic pain, vaginal bleeding, this involves an extensive number of treatment options, and is a complaint that carries with it a high risk of complications  and morbidity.  The differential diagnosis includes coagulopathy, ovarian dysfunction, malignancy, endometrial hyperplasia, iup, ovarian torsion, ectopic pregnancy, other   Co morbidities that complicate the patient evaluation  See HPI   Additional history obtained:  Additional history obtained from EMR External records from outside source obtained and reviewed including hospital records   Lab Tests:  I Ordered, and personally interpreted labs.  The pertinent results include: No leukocytosis.  Anemia hemoglobin of 10.3.  Platelets within range.  Mild hypokalemia 3.0, decreased bicarb 20 otherwise, electrolytes with normal limits.  No renal dysfunction.   Imaging Studies ordered:  Patient was not compliant during ultrasound with tech.  It was not performed.   Cardiac  Monitoring: / EKG:  The patient was maintained on a cardiac monitor.  I personally viewed and interpreted the cardiac monitored which showed an underlying rhythm of: sinus rhythm   Consultations Obtained:  N/a   Problem List / ED Course / Critical interventions / Medication management  Pelvic pain, vaginal bleeding I ordered medication including normal saline, Ativan , Toradol , droperidol , morphine , Depo-Provera , potassium chloride  Reevaluation of the patient after these medicines showed that the patient improved I have reviewed the patients home medicines and have made adjustments as needed   Social Determinants of Health:  Former cigarette use.  Denies illicit drug use.   Test / Admission - Considered:  Pelvic pain, vaginal bleeding Vitals signs within normal range and stable throughout visit. Laboratory/imaging studies significant for: See above  43 year old female presents emergency department with complaints of lower abdominal pain, vaginal bleeding.  States that symptoms began around 6 PM this evening.  Patient was recently seen in the emergency department yesterday for nausea, vomiting, abdominal pain and had CT imaging that was reassuring.  States that her symptoms from that visit are generally better but now is having more pelvic pain.  Denies any fevers, chills, urinary symptoms, change in bowel habits, vaginal discharge.  Patient does have a Nexplanon  in place. On exam, patient with tenderness midline pelvic/suprapubic region.  Labs concerning for anemia hemoglobin of 10.3 dropping nearly a gram and a half since yesterday microcytic in nature.  Attempted to get pelvic ultrasound but patient did not cooperate with ultrasound tech and was not able to have it performed while the tech was on the clock.  Pelvic exam showed large clot present externally as well as significant amount of blood in vaginal vault with clots present.  Patient had reportedly used the restroom and changed  the diaper she was wearing just prior to exam.  Given amount of bleeding, hemoglobin dropping a gram and a half in a day, consulted OB/GYN.  OB/GYN recommended 300 mg IM of Depo-Provera  and sending patient home on megestrol  for treatment of abnormal uterine bleeding with knowledge of Nexplanon  being in place.  Will make referral to OB/GYN to have patient assessed in more prompt manner.  Treatment plan discussed at length with patient and she acknowledged understanding was agreeable.  Patient well-appearing, afebrile in no acute distress upon discharge. Worrisome signs and symptoms were discussed with the patient, and the patient acknowledged understanding to return to the ED if noticed. Patient was stable upon discharge.       Final diagnoses:  None    ED Discharge Orders     None          Lynn Wilcox, Lynn Wilcox 06/07/24 0001    Lynn Hollering, MD 06/07/24 [phone]

## 2024-06-09 ENCOUNTER — Other Ambulatory Visit: Payer: Self-pay

## 2024-06-09 ENCOUNTER — Encounter (HOSPITAL_BASED_OUTPATIENT_CLINIC_OR_DEPARTMENT_OTHER): Payer: Self-pay

## 2024-06-09 ENCOUNTER — Inpatient Hospital Stay (HOSPITAL_BASED_OUTPATIENT_CLINIC_OR_DEPARTMENT_OTHER)
Admission: EM | Admit: 2024-06-09 | Discharge: 2024-06-11 | DRG: 394 | Disposition: A | Discharge status: Home / Self Care | Attending: Family Medicine | Admitting: Family Medicine

## 2024-06-09 ENCOUNTER — Emergency Department (HOSPITAL_BASED_OUTPATIENT_CLINIC_OR_DEPARTMENT_OTHER): Admitting: Radiology

## 2024-06-09 DIAGNOSIS — D509 Iron deficiency anemia, unspecified: Secondary | ICD-10-CM | POA: Diagnosis present

## 2024-06-09 DIAGNOSIS — R079 Chest pain, unspecified: Secondary | ICD-10-CM

## 2024-06-09 DIAGNOSIS — F32A Depression, unspecified: Secondary | ICD-10-CM | POA: Diagnosis present

## 2024-06-09 DIAGNOSIS — Z87891 Personal history of nicotine dependence: Secondary | ICD-10-CM

## 2024-06-09 DIAGNOSIS — R1115 Cyclical vomiting syndrome unrelated to migraine: Principal | ICD-10-CM | POA: Diagnosis present

## 2024-06-09 DIAGNOSIS — F1290 Cannabis use, unspecified, uncomplicated: Secondary | ICD-10-CM | POA: Diagnosis present

## 2024-06-09 DIAGNOSIS — R112 Nausea with vomiting, unspecified: Principal | ICD-10-CM | POA: Diagnosis present

## 2024-06-09 DIAGNOSIS — K5900 Constipation, unspecified: Secondary | ICD-10-CM | POA: Diagnosis present

## 2024-06-09 DIAGNOSIS — F419 Anxiety disorder, unspecified: Secondary | ICD-10-CM | POA: Diagnosis present

## 2024-06-09 DIAGNOSIS — Z8349 Family history of other endocrine, nutritional and metabolic diseases: Secondary | ICD-10-CM

## 2024-06-09 DIAGNOSIS — Z823 Family history of stroke: Secondary | ICD-10-CM

## 2024-06-09 DIAGNOSIS — D259 Leiomyoma of uterus, unspecified: Secondary | ICD-10-CM | POA: Diagnosis present

## 2024-06-09 DIAGNOSIS — K219 Gastro-esophageal reflux disease without esophagitis: Secondary | ICD-10-CM | POA: Diagnosis present

## 2024-06-09 DIAGNOSIS — R000 Tachycardia, unspecified: Secondary | ICD-10-CM | POA: Diagnosis present

## 2024-06-09 DIAGNOSIS — Z8249 Family history of ischemic heart disease and other diseases of the circulatory system: Secondary | ICD-10-CM

## 2024-06-09 DIAGNOSIS — E669 Obesity, unspecified: Secondary | ICD-10-CM | POA: Diagnosis present

## 2024-06-09 DIAGNOSIS — J45909 Unspecified asthma, uncomplicated: Secondary | ICD-10-CM | POA: Diagnosis present

## 2024-06-09 DIAGNOSIS — E860 Dehydration: Secondary | ICD-10-CM | POA: Diagnosis present

## 2024-06-09 DIAGNOSIS — Z6841 Body Mass Index (BMI) 40.0 and over, adult: Secondary | ICD-10-CM

## 2024-06-09 DIAGNOSIS — E876 Hypokalemia: Secondary | ICD-10-CM | POA: Diagnosis present

## 2024-06-09 DIAGNOSIS — N939 Abnormal uterine and vaginal bleeding, unspecified: Secondary | ICD-10-CM | POA: Insufficient documentation

## 2024-06-09 LAB — CBC
HCT: 30.6 % — ABNORMAL LOW (ref 36.0–46.0)
Hemoglobin: 9.6 g/dL — ABNORMAL LOW (ref 12.0–15.0)
MCH: 22.5 pg — ABNORMAL LOW (ref 26.0–34.0)
MCHC: 31.4 g/dL (ref 30.0–36.0)
MCV: 71.7 fL — ABNORMAL LOW (ref 80.0–100.0)
Platelets: 310 K/uL (ref 150–400)
RBC: 4.27 MIL/uL (ref 3.87–5.11)
RDW: 26.1 % — ABNORMAL HIGH (ref 11.5–15.5)
WBC: 9.8 K/uL (ref 4.0–10.5)
nRBC: 0 % (ref 0.0–0.2)

## 2024-06-09 LAB — TROPONIN T, HIGH SENSITIVITY: Troponin T High Sensitivity: <15 ng/L (ref 0–19)

## 2024-06-09 LAB — COMPREHENSIVE METABOLIC PANEL WITH GFR
ALT: 11 U/L (ref 0–44)
AST: 13 U/L — ABNORMAL LOW (ref 15–41)
Albumin: 4.5 g/dL (ref 3.5–5.0)
Alkaline Phosphatase: 59 U/L (ref 38–126)
Anion gap: 18 — ABNORMAL HIGH (ref 5–15)
BUN: 6 mg/dL (ref 6–20)
CO2: 21 mmol/L — ABNORMAL LOW (ref 22–32)
Calcium: 10.1 mg/dL (ref 8.9–10.3)
Chloride: 95 mmol/L — ABNORMAL LOW (ref 98–111)
Creatinine, Ser: 0.77 mg/dL (ref 0.44–1.00)
GFR, Estimated: >60 mL/min (ref >60)
Glucose, Bld: 132 mg/dL — ABNORMAL HIGH (ref 70–99)
Potassium: 3.1 mmol/L — ABNORMAL LOW (ref 3.5–5.1)
Sodium: 134 mmol/L — ABNORMAL LOW (ref 135–145)
Total Bilirubin: 0.4 mg/dL (ref 0.0–1.2)
Total Protein: 8 g/dL (ref 6.5–8.1)

## 2024-06-09 LAB — D-DIMER, QUANTITATIVE: D-Dimer, Quant: <0.27 ug{FEU}/mL (ref 0.00–0.50)

## 2024-06-09 LAB — LIPASE, BLOOD: Lipase: 22 U/L (ref 11–51)

## 2024-06-09 LAB — MAGNESIUM: Magnesium: 1.8 mg/dL (ref 1.7–2.4)

## 2024-06-09 LAB — TSH: TSH: 0.608 u[IU]/mL (ref 0.350–4.500)

## 2024-06-09 MED ORDER — LACTATED RINGERS IV BOLUS
1000.0000 mL | Freq: Once | INTRAVENOUS | Status: AC
  Administered 2024-06-09: 1000 mL via INTRAVENOUS

## 2024-06-09 MED ORDER — ONDANSETRON HCL 4 MG/2ML IJ SOLN
INTRAMUSCULAR | Status: AC
  Administered 2024-06-09: 4 mg via INTRAVENOUS
  Filled 2024-06-09: qty 2

## 2024-06-09 MED ORDER — POTASSIUM CHLORIDE 10 MEQ/100ML IV SOLN
10.0000 meq | Freq: Once | INTRAVENOUS | Status: AC
  Administered 2024-06-09: 10 meq via INTRAVENOUS

## 2024-06-09 MED ORDER — LACTATED RINGERS IV SOLN: INTRAVENOUS | Status: AC

## 2024-06-09 MED ORDER — ONDANSETRON 4 MG PO TBDP: 4.0000 mg | ORAL_TABLET | Freq: Once | ORAL | Status: DC | PRN

## 2024-06-09 MED ORDER — ONDANSETRON HCL 4 MG/2ML IJ SOLN: 4.0000 mg | Freq: Once | INTRAMUSCULAR | Status: AC | PRN

## 2024-06-09 MED ORDER — FAMOTIDINE IN NACL 20-0.9 MG/50ML-% IV SOLN
20.0000 mg | Freq: Once | INTRAVENOUS | Status: AC
  Administered 2024-06-09: 20 mg via INTRAVENOUS
  Filled 2024-06-09: qty 50

## 2024-06-09 MED ORDER — PANTOPRAZOLE SODIUM 40 MG IV SOLR
40.0000 mg | Freq: Once | INTRAVENOUS | Status: AC
  Administered 2024-06-09: 40 mg via INTRAVENOUS
  Filled 2024-06-09: qty 10

## 2024-06-09 MED ORDER — LORAZEPAM 2 MG/ML IJ SOLN
0.5000 mg | Freq: Once | INTRAMUSCULAR | Status: AC
  Administered 2024-06-09: 0.5 mg via INTRAVENOUS
  Filled 2024-06-09: qty 1

## 2024-06-09 NOTE — ED Provider Notes (Signed)
 Care of patient received from prior provider at 11:45 PM, please see their note for complete H/P and care plan.  Received handoff per ED course.  Clinical Course as of 06/09/24 2345  Mon Jun 09, 2024  2327 Stable HO MP Chief complaint of chest pain and vomiting and SOB. DDIMER pending Troponin pending [CC]    Clinical Course User Index [CC] Jerral Meth, MD    Reassessment: Patient remained with intractable nausea vomiting despite multiple modalities of medical therapy.  Remains slightly tachycardic though improving with IV fluids.  Consulted with medicine for admission for management of intractable nausea vomiting. Disposition:   Based on the above findings, I believe this patient is stable for admission.    Patient/family educated about specific findings on our evaluation and explained exact reasons for admission.  Patient/family educated about clinical situation and time was allowed to answer questions.   Admission team communicated with and agreed with need for admission. Patient admitted. Patient ready to move at this time.     Emergency Department Medication Summary:   Medications  ondansetron  (ZOFRAN -ODT) disintegrating tablet 4 mg (4 mg Oral Patient Refused/Not Given 06/09/24 1839)  lactated ringers  infusion ( Intravenous New Bag/Given 06/09/24 2251)  potassium chloride  SA (KLOR-CON  M) CR tablet 40 mEq (40 mEq Oral Not Given 06/10/24 0049)  magnesium  oxide (MAG-OX) tablet 800 mg (800 mg Oral Not Given 06/10/24 0049)  ondansetron  (ZOFRAN ) injection 4 mg (4 mg Intravenous Given 06/09/24 1834)  lactated ringers  bolus 1,000 mL (0 mLs Intravenous Stopped 06/09/24 2238)  LORazepam  (ATIVAN ) injection 0.5 mg (0.5 mg Intravenous Given 06/09/24 1937)  famotidine  (PEPCID ) IVPB 20 mg premix (0 mg Intravenous Stopped 06/09/24 2238)  pantoprazole  (PROTONIX ) injection 40 mg (40 mg Intravenous Given 06/09/24 2254)  lactated ringers  bolus 1,000 mL (0 mLs Intravenous Stopped 06/09/24 2355)  potassium  chloride 10 mEq in 100 mL IVPB (0 mEq Intravenous Stopped 06/10/24 0115)  metoCLOPramide  (REGLAN ) injection 10 mg (10 mg Intravenous Given 06/10/24 0053)            Jerral Meth, MD 06/10/24 0216

## 2024-06-09 NOTE — ED Notes (Signed)
 Patient brought back to room via wheelchair. Patient started vomiting a large amount. SL Zofran  offered and patient refused stating she had tried that at home without success. Patient agreeable to IV dose.

## 2024-06-09 NOTE — ED Provider Notes (Signed)
 [place] EMERGENCY DEPARTMENT AT [facility] Provider Note   CSN: [id number] Arrival date & time: 06/09/24  1712     Patient presents with: Shortness of Breath, Emesis, Vaginal Bleeding, and Heartburn   Lynn Wilcox is a 43 y.o. female.   HPI Patient reports that since her discharge from the emergency department 2 days ago, she has had recurrence and ongoing dry heaves and vomiting.  Patient reports that she is not able to keep anything down.  She reports that Zofran  is not helping.  She has pain in her epigastrium and she also indicates her left upper quadrant and up through the center of her chest.  Patient reports that she thinks it is reflux that causes intense burning and then feeling of shortness of breath and chest pressure.  She cannot get any relief from it and continues to have dry heaves.  She reports it feels like there is a lot of pressure in her chest and it feels hard to breathe.  Patient denies she has had fever.  She does have a history of asthma but denies she has had any significant coughing.  She reports her other problem is vaginal bleeding.  She reports she has had problems with frequent menstrual cycles and vaginal bleeding.  She reports she is getting established with OB/GYN.  Patient was treated 2 days ago with a Depo-Provera  shot and prescription for Megace  per consultation with OB/GYN by the EDP.  The patient does report that the bleeding has slowed down quite a bit.    Prior to Admission medications   Medication Sig Start Date End Date Taking? Authorizing Provider  albuterol  (VENTOLIN  HFA) 108 (90 Base) MCG/ACT inhaler Inhale 2 puffs into the lungs. 10/26/22   [provider]  cetirizine  (ZYRTEC ) 10 MG tablet TAKE 1 TABLET BY MOUTH DAILY 07/07/20   Dayna Motto, DO  dicyclomine  (BENTYL ) 20 MG tablet Take 1 tablet (20 mg total) by mouth 2 (two) times daily. 06/05/24   Daralene Lonni BIRCH, PA-C  ferrous sulfate  324 (65 Fe) MG TBEC Take 1 tablet (325  mg total) by mouth every other day. 05/03/23   Joshua Domino, DO  fluticasone  (FLONASE ) 50 MCG/ACT nasal spray Place 2 sprays into both nostrils daily. 05/09/24   Lorrane Pac, MD  megestrol  (MEGACE ) 400 MG/10ML suspension Take 30 mLs (1,200 mg total) by mouth daily. 06/06/24   Silver Wonda LABOR, PA  ondansetron  (ZOFRAN -ODT) 4 MG disintegrating tablet Take 1 tablet (4 mg total) by mouth every 8 (eight) hours as needed for nausea or vomiting. 06/05/24   Daralene Lonni BIRCH, PA-C  pantoprazole  (PROTONIX ) 40 MG tablet Take 1 tablet (40 mg total) by mouth 2 (two) times daily. 03/18/24   Heinz, Sara E, PA-C  promethazine  (PHENERGAN ) 25 MG suppository Place 1 suppository (25 mg total) rectally every 6 (six) hours as needed for nausea or vomiting. 06/06/24   Silver Wonda LABOR, PA    Allergies: Coffee bean extract [coffea arabica], Soy allergy (obsolete), Tea, Yeast-derived drug products, and Chocolate    Review of Systems  Updated Vital Signs BP (!) 152/95   Pulse 99   Temp 98.9 F (37.2 C)   Resp 11   LMP 05/18/2024 (Exact Date)   SpO2 100%   Physical Exam Constitutional:      Comments: Patient is alert.  Mental status clear.  She is frequently spitting into an emesis bag.  She does appear uncomfortable and is hyperventilating slightly.  Speaking in full sentences without difficulty.  HENT:  Head: Normocephalic and atraumatic.     Mouth/Throat:     Mouth: Mucous membranes are moist.     Pharynx: Oropharynx is clear.  Eyes:     Extraocular Movements: Extraocular movements intact.     Pupils: Pupils are equal, round, and reactive to light.  Cardiovascular:     Rate and Rhythm: Regular rhythm. Tachycardia present.  Pulmonary:     Effort: Pulmonary effort is normal.     Breath sounds: Normal breath sounds.     Comments: Patient is hyperventilating slightly but I do not appreciate any wheezing.  Lungs are clear. Abdominal:     Comments: Patient endorses discomfort palpation in the  epigastrium and left upper quadrant.  Lower abdomen nontender.  Musculoskeletal:        General: No swelling or tenderness. Normal range of motion.     Cervical back: Neck supple.     Right lower leg: No edema.     Left lower leg: No edema.  Skin:    General: Skin is warm and dry.  Neurological:     General: No focal deficit present.     Mental Status: She is oriented to person, place, and time.     Motor: No weakness.     Coordination: Coordination normal.  Psychiatric:     Comments: Patient is mildly anxious.  Mental status is clear.  Situationally appropriate.     (all labs ordered are listed, but only abnormal results are displayed) Labs Reviewed  COMPREHENSIVE METABOLIC PANEL WITH GFR - Abnormal; Notable for the following components:      Result Value   Sodium 134 (*)    Potassium 3.1 (*)    Chloride 95 (*)    CO2 21 (*)    Glucose, Bld 132 (*)    AST 13 (*)    Anion gap 18 (*)    All other components within normal limits  CBC - Abnormal; Notable for the following components:   Hemoglobin 9.6 (*)    HCT 30.6 (*)    MCV 71.7 (*)    MCH 22.5 (*)    RDW 26.1 (*)    All other components within normal limits  LIPASE, BLOOD  MAGNESIUM   TSH  D-DIMER, QUANTITATIVE  URINALYSIS, ROUTINE W REFLEX MICROSCOPIC  PREGNANCY, URINE  URINE DRUG SCREEN  TROPONIN T, HIGH SENSITIVITY    EKG: EKG Interpretation Date/Time:  Monday June 09 2024 17:20:00 EDT Ventricular Rate:  99 PR Interval:  124 QRS Duration:  72 QT Interval:  354 QTC Calculation: 454 R Axis:   60  Text Interpretation: Normal sinus rhythm with sinus arrhythmia Normal ECG When compared with ECG of 14-Apr-2024 21:14, Vent. rate has decreased BY  52 BPM rate slower since first previous, no acute ischemic appearance Confirmed by Armenta Canning [phone]) on 06/09/2024 11:04:16 PM  Radiology: [facility] Chest 2 View Result Date: 06/09/2024 CLINICAL DATA:  Shortness of breath.  Vomiting. EXAM: CHEST - 2 VIEW COMPARISON:   CT 02/27/2024 FINDINGS: The cardiomediastinal contours are normal. Mild bronchial thickening. Pulmonary vasculature is normal. No consolidation, pleural effusion, or pneumothorax. No acute osseous abnormalities are seen. IMPRESSION: Mild bronchial thickening. Electronically Signed   By: Andrea Gasman M.D.   On: 06/09/2024 18:23     Procedures   Medications Ordered in the ED  ondansetron  (ZOFRAN -ODT) disintegrating tablet 4 mg (4 mg Oral Patient Refused/Not Given 06/09/24 1839)  lactated ringers  infusion ( Intravenous New Bag/Given 06/09/24 2251)  potassium chloride  10 mEq in 100 mL IVPB (  has no administration in time range)  ondansetron  (ZOFRAN ) injection 4 mg (4 mg Intravenous Given 06/09/24 1834)  lactated ringers  bolus 1,000 mL (0 mLs Intravenous Stopped 06/09/24 2238)  LORazepam  (ATIVAN ) injection 0.5 mg (0.5 mg Intravenous Given 06/09/24 1937)  famotidine  (PEPCID ) IVPB 20 mg premix (0 mg Intravenous Stopped 06/09/24 2238)  pantoprazole  (PROTONIX ) injection 40 mg (40 mg Intravenous Given 06/09/24 2254)  lactated ringers  bolus 1,000 mL (1,000 mLs Intravenous New Bag/Given 06/09/24 2254)    Clinical Course as of 06/09/24 2335  Mon Jun 09, 2024  2327 Stable HO MP Chief complaint of chest pain and vomiting and SOB. DDIMER pending Troponin pending [CC]    Clinical Course User Index [CC] Jerral Meth, MD                                 Medical Decision Making Amount and/or Complexity of Data Reviewed Labs: ordered. Radiology: ordered.  Risk Prescription drug management.  Patient presents as outlined.  She has been having ongoing problems with recurrent vomiting, epigastric pain and chest pain.  She reports that rebounded soon after she left the hospital 2 days ago.  She reports has been unable to tolerate any oral intake or medications.  She does report vaginal bleeding has slowed down since being treated in the emergency department with a Depo-Provera  shot.  At this time we will proceed  with rehydration, PPI and antiemetic, Ativan  for combination of intractable nausea and vomiting with appearance of anxiety.  22: 35 patient reports initially she felt better for a while after her fluid bolus and Ativan .  She was able to rest well but she feels like the nausea is back and feeling of difficulty breathing is back.  Patient's heart rate is variable from low one teens to 120s after liter of fluids, Pepcid , Zofran  and Ativan .  Will add another liter of fluids, another half milligram of Ativan  for nausea and appearance of anxiety.  When the patient is resting, there is no appreciable difficulty breathing and heart rate comes down to the 90s.  When she is awake and talking or moves around at all, heart rate goes up to 120s.  Patient had prior CT PE study in May and was negative for any acute findings.  At this time I have lower suspicion for PE but will add D-dimer and troponin.  Comprehensive metabolic panel anion gap of 18 GFR greater than 60 sodium 134 potassium 3.1.  Lipase 22 white count 9.8 H&H 9.6 and 30  If patient continues to be symptomatic with tachycardia, vomiting and nausea, anticipate admission.  Dr. Jerral to follow-up on remainder of results for final disposition     Final diagnoses:  Intractable vomiting with nausea  Chest pain, unspecified type  Tachycardia    ED Discharge Orders     None          Armenta Canning, MD 06/09/24 2335

## 2024-06-09 NOTE — ED Triage Notes (Signed)
 Patient reports heart burn issues and it causes shortness of breath. She also reports vomiting since Thursday and has been unable to keep anything down. She reports she was here recently for vaginal bleeding. So that is also an added complaint.

## 2024-06-10 DIAGNOSIS — Z8249 Family history of ischemic heart disease and other diseases of the circulatory system: Secondary | ICD-10-CM | POA: Diagnosis not present

## 2024-06-10 DIAGNOSIS — R000 Tachycardia, unspecified: Secondary | ICD-10-CM | POA: Diagnosis present

## 2024-06-10 DIAGNOSIS — R112 Nausea with vomiting, unspecified: Secondary | ICD-10-CM | POA: Diagnosis present

## 2024-06-10 DIAGNOSIS — J45909 Unspecified asthma, uncomplicated: Secondary | ICD-10-CM | POA: Diagnosis present

## 2024-06-10 DIAGNOSIS — K5900 Constipation, unspecified: Secondary | ICD-10-CM | POA: Diagnosis present

## 2024-06-10 DIAGNOSIS — N939 Abnormal uterine and vaginal bleeding, unspecified: Secondary | ICD-10-CM | POA: Diagnosis not present

## 2024-06-10 DIAGNOSIS — D509 Iron deficiency anemia, unspecified: Secondary | ICD-10-CM | POA: Diagnosis present

## 2024-06-10 DIAGNOSIS — F419 Anxiety disorder, unspecified: Secondary | ICD-10-CM | POA: Diagnosis present

## 2024-06-10 DIAGNOSIS — R1115 Cyclical vomiting syndrome unrelated to migraine: Secondary | ICD-10-CM | POA: Diagnosis present

## 2024-06-10 DIAGNOSIS — D259 Leiomyoma of uterus, unspecified: Secondary | ICD-10-CM | POA: Diagnosis present

## 2024-06-10 DIAGNOSIS — E876 Hypokalemia: Secondary | ICD-10-CM | POA: Diagnosis present

## 2024-06-10 DIAGNOSIS — Z87891 Personal history of nicotine dependence: Secondary | ICD-10-CM | POA: Diagnosis not present

## 2024-06-10 DIAGNOSIS — F32A Depression, unspecified: Secondary | ICD-10-CM | POA: Diagnosis present

## 2024-06-10 DIAGNOSIS — K219 Gastro-esophageal reflux disease without esophagitis: Secondary | ICD-10-CM | POA: Diagnosis present

## 2024-06-10 DIAGNOSIS — R1110 Vomiting, unspecified: Secondary | ICD-10-CM | POA: Diagnosis present

## 2024-06-10 DIAGNOSIS — Z8349 Family history of other endocrine, nutritional and metabolic diseases: Secondary | ICD-10-CM | POA: Diagnosis not present

## 2024-06-10 DIAGNOSIS — Z823 Family history of stroke: Secondary | ICD-10-CM | POA: Diagnosis not present

## 2024-06-10 DIAGNOSIS — F1290 Cannabis use, unspecified, uncomplicated: Secondary | ICD-10-CM | POA: Diagnosis present

## 2024-06-10 DIAGNOSIS — Z6841 Body Mass Index (BMI) 40.0 and over, adult: Secondary | ICD-10-CM | POA: Diagnosis not present

## 2024-06-10 DIAGNOSIS — E669 Obesity, unspecified: Secondary | ICD-10-CM | POA: Diagnosis present

## 2024-06-10 DIAGNOSIS — E860 Dehydration: Secondary | ICD-10-CM | POA: Diagnosis present

## 2024-06-10 LAB — CBC
HCT: 26.5 % — ABNORMAL LOW (ref 36.0–46.0)
Hemoglobin: 8.1 g/dL — ABNORMAL LOW (ref 12.0–15.0)
MCH: 22.4 pg — ABNORMAL LOW (ref 26.0–34.0)
MCHC: 30.6 g/dL (ref 30.0–36.0)
MCV: 73.4 fL — ABNORMAL LOW (ref 80.0–100.0)
Platelets: 249 K/uL (ref 150–400)
RBC: 3.61 MIL/uL — ABNORMAL LOW (ref 3.87–5.11)
RDW: 26.1 % — ABNORMAL HIGH (ref 11.5–15.5)
WBC: 8.7 K/uL (ref 4.0–10.5)
nRBC: 0 % (ref 0.0–0.2)

## 2024-06-10 LAB — BASIC METABOLIC PANEL WITH GFR
Anion gap: 12 (ref 5–15)
Anion gap: 9 (ref 5–15)
BUN: <5 mg/dL — ABNORMAL LOW (ref 6–20)
BUN: <5 mg/dL — ABNORMAL LOW (ref 6–20)
CO2: 22 mmol/L (ref 22–32)
CO2: 23 mmol/L (ref 22–32)
Calcium: 8.5 mg/dL — ABNORMAL LOW (ref 8.9–10.3)
Calcium: 8.3 mg/dL — ABNORMAL LOW (ref 8.9–10.3)
Chloride: 102 mmol/L (ref 98–111)
Chloride: 101 mmol/L (ref 98–111)
Creatinine, Ser: 0.77 mg/dL (ref 0.44–1.00)
Creatinine, Ser: 0.68 mg/dL (ref 0.44–1.00)
GFR, Estimated: >60 mL/min (ref >60)
GFR, Estimated: >60 mL/min (ref >60)
Glucose, Bld: 110 mg/dL — ABNORMAL HIGH (ref 70–99)
Glucose, Bld: 129 mg/dL — ABNORMAL HIGH (ref 70–99)
Potassium: 2.8 mmol/L — ABNORMAL LOW (ref 3.5–5.1)
Potassium: 3.2 mmol/L — ABNORMAL LOW (ref 3.5–5.1)
Sodium: 136 mmol/L (ref 135–145)
Sodium: 133 mmol/L — ABNORMAL LOW (ref 135–145)

## 2024-06-10 LAB — URINALYSIS, ROUTINE W REFLEX MICROSCOPIC
Bacteria, UA: NONE SEEN
Bilirubin Urine: NEGATIVE
Glucose, UA: NEGATIVE mg/dL
Ketones, ur: 80 mg/dL — AB
Nitrite: NEGATIVE
Protein, ur: 100 mg/dL — AB
RBC / HPF: >50 RBC/hpf (ref 0–5)
Specific Gravity, Urine: 1.021 (ref 1.005–1.030)
pH: 6 (ref 5.0–8.0)

## 2024-06-10 LAB — RAPID URINE DRUG SCREEN, HOSP PERFORMED
Amphetamines: NOT DETECTED
Barbiturates: NOT DETECTED
Benzodiazepines: NOT DETECTED
Cocaine: NOT DETECTED
Opiates: NOT DETECTED
Tetrahydrocannabinol: POSITIVE — AB

## 2024-06-10 LAB — HIV ANTIBODY (ROUTINE TESTING W REFLEX): HIV Screen 4th Generation wRfx: NONREACTIVE

## 2024-06-10 LAB — PREGNANCY, URINE: Preg Test, Ur: NEGATIVE

## 2024-06-10 MED ORDER — SODIUM CHLORIDE 0.9% FLUSH
10.0000 mL | Freq: Two times a day (BID) | INTRAVENOUS | Status: DC
  Administered 2024-06-10 – 2024-06-11 (×3): 10 mL

## 2024-06-10 MED ORDER — SCOPOLAMINE 1 MG/3DAYS TD PT72
1.0000 | MEDICATED_PATCH | TRANSDERMAL | Status: DC
  Administered 2024-06-10: 1 mg via TRANSDERMAL
  Filled 2024-06-10: qty 1

## 2024-06-10 MED ORDER — SENNOSIDES-DOCUSATE SODIUM 8.6-50 MG PO TABS
1.0000 | ORAL_TABLET | Freq: Every day | ORAL | Status: DC
  Administered 2024-06-11: 1 via ORAL
  Filled 2024-06-10: qty 1

## 2024-06-10 MED ORDER — TRIMETHOBENZAMIDE HCL 100 MG/ML IM SOLN
200.0000 mg | Freq: Four times a day (QID) | INTRAMUSCULAR | Status: DC | PRN
  Administered 2024-06-10: 200 mg via INTRAMUSCULAR
  Filled 2024-06-10 (×2): qty 2

## 2024-06-10 MED ORDER — POTASSIUM CHLORIDE CRYS ER 20 MEQ PO TBCR
40.0000 meq | EXTENDED_RELEASE_TABLET | ORAL | Status: AC
  Administered 2024-06-11 (×2): 40 meq via ORAL
  Filled 2024-06-10 (×2): qty 2

## 2024-06-10 MED ORDER — MEGESTROL ACETATE 400 MG/10ML PO SUSP: 1200.0000 mg | Freq: Every day | ORAL | Status: DC

## 2024-06-10 MED ORDER — PANTOPRAZOLE SODIUM 40 MG IV SOLR
40.0000 mg | Freq: Two times a day (BID) | INTRAVENOUS | Status: DC
  Administered 2024-06-10 (×2): 40 mg via INTRAVENOUS
  Filled 2024-06-10 (×3): qty 10

## 2024-06-10 MED ORDER — METOCLOPRAMIDE HCL 5 MG/ML IJ SOLN
10.0000 mg | Freq: Once | INTRAMUSCULAR | Status: AC
  Administered 2024-06-10: 10 mg via INTRAVENOUS
  Filled 2024-06-10: qty 2

## 2024-06-10 MED ORDER — PROMETHAZINE HCL 25 MG RE SUPP: 25.0000 mg | Freq: Four times a day (QID) | RECTAL | Status: DC | PRN

## 2024-06-10 MED ORDER — FERROUS SULFATE 325 (65 FE) MG PO TABS
325.0000 mg | ORAL_TABLET | ORAL | Status: DC
  Administered 2024-06-10: 325 mg via ORAL
  Filled 2024-06-10 (×3): qty 1

## 2024-06-10 MED ORDER — ONDANSETRON 4 MG PO TBDP: 4.0000 mg | ORAL_TABLET | Freq: Three times a day (TID) | ORAL | Status: DC | PRN

## 2024-06-10 MED ORDER — POTASSIUM CHLORIDE CRYS ER 20 MEQ PO TBCR
40.0000 meq | EXTENDED_RELEASE_TABLET | ORAL | Status: AC
  Administered 2024-06-10: 40 meq via ORAL
  Filled 2024-06-10: qty 2

## 2024-06-10 MED ORDER — POTASSIUM CHLORIDE 10 MEQ/100ML IV SOLN
10.0000 meq | INTRAVENOUS | Status: AC
  Administered 2024-06-10 (×4): 10 meq via INTRAVENOUS
  Filled 2024-06-10 (×4): qty 100

## 2024-06-10 MED ORDER — POTASSIUM CHLORIDE 10 MEQ/100ML IV SOLN
10.0000 meq | Freq: Once | INTRAVENOUS | Status: AC
  Administered 2024-06-10: 10 meq via INTRAVENOUS
  Filled 2024-06-10: qty 100

## 2024-06-10 MED ORDER — MAGNESIUM OXIDE -MG SUPPLEMENT 400 (240 MG) MG PO TABS
800.0000 mg | ORAL_TABLET | Freq: Once | ORAL | Status: DC
  Filled 2024-06-10: qty 2

## 2024-06-10 MED ORDER — POTASSIUM CHLORIDE CRYS ER 20 MEQ PO TBCR
40.0000 meq | EXTENDED_RELEASE_TABLET | Freq: Once | ORAL | Status: DC
  Filled 2024-06-10: qty 2

## 2024-06-10 MED ORDER — POTASSIUM CHLORIDE CRYS ER 20 MEQ PO TBCR: 40.0000 meq | EXTENDED_RELEASE_TABLET | ORAL | Status: DC

## 2024-06-10 MED ORDER — SODIUM CHLORIDE 0.9% FLUSH: 10.0000 mL | INTRAVENOUS | Status: DC | PRN

## 2024-06-10 MED ORDER — PANTOPRAZOLE SODIUM 40 MG PO TBEC: 40.0000 mg | DELAYED_RELEASE_TABLET | Freq: Two times a day (BID) | ORAL | Status: DC

## 2024-06-10 MED ORDER — POLYETHYLENE GLYCOL 3350 17 G PO PACK
17.0000 g | PACK | Freq: Every day | ORAL | Status: DC
  Administered 2024-06-11: 17 g via ORAL
  Filled 2024-06-10: qty 1

## 2024-06-10 MED ORDER — MEGESTROL ACETATE 400 MG/10ML PO SUSP
120.0000 mg | Freq: Every day | ORAL | Status: DC
Start: 2024-06-10 — End: 2024-06-10
  Administered 2024-06-10: 120 mg via ORAL
  Filled 2024-06-10 (×2): qty 5
  Filled 2024-06-10: qty 10

## 2024-06-10 NOTE — Hospital Course (Addendum)
 Lynn Wilcox is a 43 y.o.female with a history of GERD, asthma, migraines who was admitted to the [facility] Medicine Teaching Service at [facility] for intractable nausea and vomiting. Her hospital course is detailed below:  Intractable N/V Received Zofran , Reglan , Ativan , Pepcid  in ED with continued N/V. Urine pregnancy negative. UDS +THC. Patient received fluids while unable to tolerate PO. Electrolytes were monitored and repleted as indicated. By time of discharge patient was tolerating PO without issue ***  Other chronic conditions were medically managed with home medications and formulary alternatives as necessary (***)  PCP Follow-up Recommendations:

## 2024-06-10 NOTE — Assessment & Plan Note (Addendum)
 Known fibroids from TVUS in 2023.  Passing large clots recently, improved with recent Megace  course.  Recently received Depo shot, has Nexplanon  in placed. Hemoglobin 9.6 in ED, relatively stable 10.3 on 8/29. - Cont Megace  daily as prescribed by previous Edp - per chart review, appears to be 120mg /day dosage  - Continue home iron every other day - Outpatient follow-up with OB/GYN as planned - Transfusion threshold hemoglobin < 7 - AM CBC

## 2024-06-10 NOTE — H&P (Addendum)
 Hospital Admission History and Physical Service Pager: [phone]  Patient name: Lynn Wilcox Medical record number: [id number] Date of Birth: 1980-10-20 Age: 43 y.o. Gender: female  Primary Care Provider: Kathrine Melena, DO Consultants: None Code Status: Full Preferred Emergency Contact: Mac [place] Information     Name Relation Home Work Mobile   Porter,Mac Friend [phone]        Other Contacts   None on File      Chief Complaint: Nausea and vomiting/ heavy vaginal bleeding    Differential and Medical Decision Making:  Lynn Wilcox is a 43 y.o. female presenting with nausea and vomiting.  Differential for this patient's presentation of this includes GERD (known history of GERD and hiatal hernia), viral illness (less likely given lack of other illness symptoms, afebrile, ongoing on and off for 2 years), cannabis hyperemesis syndrome (possible given consistent use of cannabis with positive UDS for THC).  Unlikely acute abdomen given reassuring abdominal exam and BM during admission.   Heavy vaginal bleeding is likely secondary to known fibroids.  Patient has Nexplanon , received Depo-Provera  on 8/29, and was started on Megace  on 8/29.  Unable to see OB/GYN outpatient yet. Assessment & Plan Intractable nausea and vomiting S/p Zofran , Reglan , Pepcid , Ativan , IVF in ED with improving nausea. Continued dry heaving and spitting up saliva.  UDS positive for THC.  Urine pregnancy test negative. Recent CTA&P 8/28 with possible mild colitis, though clinical presentation does not suggest this given lack of diarrhea and afebrile status. Ddx as above.  - FMTS will admit, attending Dr. Rosalynn - Continuous mIVF LR at 125 mL/HR - Zofran  ODT 4 mg as needed for nausea  - IV Tigan  200 mg as needed every 6 hours  - Pantoprazole  IV 40 mg BID - Consider Ativan  as needed for anxiety/refractory nausea - Diet NPO, advance as tolerated - AM BMP, CBC - replete electrolytes as indicated   Vaginal bleeding Microcytic anemia Known fibroids from TVUS in 2023.  Passing large clots recently, improved with recent Megace  course.  Recently received Depo shot, has Nexplanon  in placed. Hemoglobin 9.6 in ED, relatively stable 10.3 on 8/29. - Cont Megace  daily as prescribed by previous Edp - per chart review, appears to be 120mg /day dosage  - Continue home iron every other day - Outpatient follow-up with OB/GYN as planned - Transfusion threshold hemoglobin < 7 - AM CBC   Chronic and Stable Conditions: Asthma: consider home albuterol  inhaler as needed GERD: IV Protonix  40 BID (home regimen is oral protonix  40 BID)  FEN/GI: NPO, advance as tolerated VTE Prophylaxis: SCDs given menorrhagia   Disposition: Med/tele  History of Present Illness:  Lynn Wilcox is a 43 y.o. female presenting with N/V and heavy vaginal bleeding.   Patient began having nausea and vomiting last Thursday, 8/28.  She went to the ED on 8/28 and then again on 9/1.  She states the last time she was able to keep anything down was Wednesday evening.  States she has been throwing up all day and all night.  Endorses possibly some blood in her vomit, however states this may have been cranberry juice she drank.  Also endorses possibly some coffee-ground appearance to her emesis.  She was given under the tongue disintegrating Zofran  which she states she has not worked.  He was also given Compazine  suppositories which she did not use.  Her nausea has improved.  However, she states she is still vomiting.  Her current vomitus is simply  spit.  Endorses esophageal burning earlier.  Last bowel movement was today after arriving to the floor, strained, small amount, dark black/soft.  Patient is also complaining of heavy vaginal bleeding.  States she is passing very large clots daily.  She has known fibroids.  Currently has a Nexplanon  in place and received a Depo-Provera  shot on 8/29.  She was also put on Megace  on 8/29. Her bleeding  is beginning to improve today. Denies any urinary symptoms.  In the ED, patient's potassium was 3.1.  Patient received one 10 mEq IV repletion.  Refused oral repletion.  Hemoglobin was 9.6 down from 10.3 on 8/29.  MCV of 71.7.  Patient received a chest x-ray that showed mild bronchial thickening.  During her ED visit on 8/28 CT abdomen and pelvis showed mild colonic wall thickening in the ascending and transverse colon (may represent mild colitis), small hiatal hernia, and a fat-containing umbilical hernia.  Patient received 4 mg Zofran  IV.  Patient received 2 L of LR and was started on continuous LR.  Pantoprazole  40 mg IV given.  Lorazepam  0.5 mg IV given for anxiety.   Review Of Systems: Per HPI   Pertinent Past Medical History: Obesity GERD Hiatal hernia  Anxiety and depression   Remainder reviewed in history tab.   Pertinent Past Surgical History: EGD Breast mass removal    Remainder reviewed in history tab.  Pertinent Social History: Tobacco use: Former, quit about 20 years ago 1 pack per week  Alcohol use: Occasional, once every few months  Other Substance use: Marijuana, on weekends  Lives with son, son's father   Pertinent Family History: Mother - HTN, HLD, stroke     Important Outpatient Medications: Albuterol  inhaler  Cetrizine 10 mg  Bentyl  20 mg BID Ferrous sulfate  324 mg every other day Flonase   Megace  400 mg/10 mL  Zofran  4 mg disintegrating tablet  Protonix  40 mg  Promethazine  25 mg suppository - could not do  Nexplanon  in place  Depo-Provera  given 06/06/24   Objective: BP (!) 142/83 (BP Location: Right Arm)   Pulse 91   Temp 98.2 F (36.8 C) (Oral)   Resp 20   LMP 05/18/2024 (Exact Date)   SpO2 100%  Exam: General: NAD Eyes: Nonicteric, EOM grossly intact ENTM: Throat nonerythematous, mucous membranes moist Cardiovascular: Tachycardic but improved during exam, no M/R/G, normal capillary refill Respiratory: CTAB, normal work of breathing on room  air Gastrointestinal: Soft, nondistended, tender to palpation to left upper and lower quadrants and epigastric regions, no guarding, no rebound Derm: Skin warm, dry Neuro: Grossly intact, no focal deficits Psych: Mood and affect appropriate  Labs:  CBC BMET  Recent Labs  Lab 06/09/24 1723  WBC 9.8  HGB 9.6*  HCT 30.6*  PLT 310   Recent Labs  Lab 06/09/24 1723  NA 134*  K 3.1*  CL 95*  CO2 21*  BUN 6  CREATININE 0.77  GLUCOSE 132*  CALCIUM 10.1     EKG: Sinus tachycardia, no ST elevation, no bundle branch blocks   Imaging Studies Performed:  Chest X-ray:  Impression from Radiologist: Mild bronchial thickening    My Interpretation: no consolidation, no pleural effusion, no opacities, no cardiomegaly   Lennie Raguel MATSU, DO 06/10/2024, 4:25 AM PGY-1, [facility]  FPTS Intern pager: [phone], text pages welcome Secure chat group [facility] Teaching Service   I was personally present and performed or re-performed the history, physical exam and medical decision making activities of this  service and have verified that the service and findings are accurately documented in the resident's note.  Damien Cassis, MD                  06/10/2024, 7:54 AM

## 2024-06-10 NOTE — Progress Notes (Signed)
 New Admission Note:    Arrival Method: ED stretcher Mental Orientation: AAOx4 Telemetry: 5M16 Assessment: Completed Skin: See flowsheet IV: RFA Pain: 0/10 Tubes: n/a Safety Measures: Safety Fall Prevention Plan has been given, discussed and signed Admission: Completed [facility] Orientation: Patient has been orientated to the room, unit and staff.  Family: none at bedside   Orders have been reviewed and implemented. Will continue to monitor the patient. Call light has been placed within reach and bed alarm has been activated.

## 2024-06-10 NOTE — Assessment & Plan Note (Addendum)
 S/p Zofran , Reglan , Pepcid , Ativan , IVF in ED with improving nausea. Continued dry heaving and spitting up saliva.  UDS positive for THC.  Urine pregnancy test negative. Recent CTA&P 8/28 with possible mild colitis, though clinical presentation does not suggest this given lack of diarrhea and afebrile status. Ddx as above.  - FMTS will admit, attending Dr. Rosalynn - Continuous mIVF LR at 125 mL/HR - Zofran  ODT 4 mg as needed for nausea  - IV Tigan  200 mg as needed every 6 hours  - Pantoprazole  IV 40 mg BID - Consider Ativan  as needed for anxiety/refractory nausea - Diet NPO, advance as tolerated - AM BMP, CBC - replete electrolytes as indicated

## 2024-06-10 NOTE — Progress Notes (Signed)

## 2024-06-10 NOTE — Plan of Care (Addendum)
 Plan of Care Note for accepted transfer   Patient name: Lynn Wilcox FMW:[id number] DOB: Jan 03, 1981  Facility requesting transfer: [facility] Requesting Provider: Dr. Jerral Facility course: 43 year old female with history of asthma, GERD, hiatal hernia, migraines, class III obesity, anxiety/depression presenting with complaints of intractable nausea and vomiting, epigastric abdominal/chest pain, and shortness of breath.  Also reported history of menorrhagia for which she was treated with Depo-Provera  2 days ago and was given prescription for Megace  and since then the bleeding has slowed down.  Tachycardic to the 120s on arrival.  Not hypoxic.  Hemoglobin 9.6 (previously 10.3 on labs 3 days ago), sodium 134, potassium 3.1, chloride 95, bicarb 21, anion gap 18, glucose 132, creatinine 0.7, no elevation of lipase or LFTs, TSH normal, initial troponin negative and repeat pending, D-dimer negative, UA pending, urine pregnancy test pending, UDS pending.  Chest x-ray showing mild bronchial thickening.  EKG without acute ischemic changes.  Patient was given Ativan , Reglan , IV Protonix , oral and IV potassium, IV Pepcid , and 2 L IV fluids.  Tachycardia improved after IV fluids.  Addendum: Told by ED physician that patient has history of marijuana abuse.  Plan of care: The patient is accepted for admission to Telemetry unit at [facility].  Family medicine teaching service will assume care on arrival to accepting facility (discussed with Dr. Diona). Until arrival, care as per EDP.

## 2024-06-10 NOTE — Progress Notes (Addendum)
 FMTS Interim Progress Note  S: Lynn Wilcox is a  43 y.o. female presenting with nausea and vomiting. Patient with long history of episodic N/V, uterine bleeding, and GERD. Pt states she typically become N/V when she is dehydrated and during her period. States been having period since 9/17. Started w/ heavy bleeding from 9/17 - 9/24 then again on Friday 9/29. She was referred to GYN but haven't have a schedule yet. Pt state SL Zofran  induced her nausea during N/V. She had 4x N/V episode since admission  O: BP (!) 146/95 (BP Location: Right Arm)   Pulse (!) 105   Temp 98.3 F (36.8 C)   Resp 19   LMP 05/18/2024 (Exact Date)   SpO2 100%   Physical Exam Constitutional:      Appearance: She is obese.  Cardiovascular:     Rate and Rhythm: Normal rate.  Pulmonary:     Breath sounds: Examination of the right-lower field reveals decreased breath sounds. Examination of the left-lower field reveals decreased breath sounds. Decreased breath sounds present.  Abdominal:     Palpations: Abdomen is soft.  Skin:    General: Skin is warm and dry.     Capillary Refill: Capillary refill takes less than 2 seconds.  Neurological:     General: No focal deficit present.     Mental Status: She is alert and oriented to person, place, and time.     A/P: Lynn Wilcox is a  42 y.o. female presenting with nausea and vomiting. Patient look non-toxic but still c/o N/V with constipation.   1. Intractable vomiting with nausea (Primary) Patient with worsening N/V in the last few days. States she has been dehydrated  - Advance diet as tolerated - VTE prophylaxis : SCD - Fluid IVF LR 125 ml/hr while NPO  - AM CBC/BMP  - Supplement electrolyte as needed - Scopolamine  patch 1 mg Q72H - Zofran  4 mg tab Q8H PRM - TIGAN  200 mg Q6H PRN - Phenergan  SR 25 mg Q6H  2. Vaginal Bleeding Patient states has been experiencing vaginal bleed in the last 2 years which she has been waiting for her GYN appointment. Last period  started on 8/17-8/24 then again on Friday.  -  Hold Home Megestrol  400 mg/10 ml 120 mg daily  3. Constipation Patient states last BM was on 9/1 but w/ very small amount. -  MiraLax  17 g daily  -  Senokot - S Daily   4. Hypokalemia  AM potassium is 2.8 - Will give 40 meq IV over 4 hours and 40 mg PO once - Daily BMP  5. Chronic Health problem - GERD : Protonix  IV 40 mg daily  Lynn Erker B, DO 06/10/2024, 9:32 AM PGY-1, [facility] Family Medicine Service pager [phone]

## 2024-06-11 ENCOUNTER — Other Ambulatory Visit (HOSPITAL_COMMUNITY): Payer: Self-pay

## 2024-06-11 DIAGNOSIS — N939 Abnormal uterine and vaginal bleeding, unspecified: Secondary | ICD-10-CM | POA: Diagnosis not present

## 2024-06-11 DIAGNOSIS — E876 Hypokalemia: Secondary | ICD-10-CM | POA: Insufficient documentation

## 2024-06-11 DIAGNOSIS — R112 Nausea with vomiting, unspecified: Secondary | ICD-10-CM | POA: Diagnosis not present

## 2024-06-11 DIAGNOSIS — R1115 Cyclical vomiting syndrome unrelated to migraine: Secondary | ICD-10-CM | POA: Diagnosis not present

## 2024-06-11 LAB — CBC WITH DIFFERENTIAL/PLATELET
Basophils Absolute: 0 K/uL (ref 0.0–0.1)
Basophils Relative: 0 %
Eosinophils Absolute: 0 K/uL (ref 0.0–0.5)
Eosinophils Relative: 0 %
HCT: 26.6 % — ABNORMAL LOW (ref 36.0–46.0)
Hemoglobin: 8.1 g/dL — ABNORMAL LOW (ref 12.0–15.0)
Lymphocytes Relative: 32 %
Lymphs Abs: 2.7 K/uL (ref 0.7–4.0)
MCH: 23.1 pg — ABNORMAL LOW (ref 26.0–34.0)
MCHC: 30.5 g/dL (ref 30.0–36.0)
MCV: 75.8 fL — ABNORMAL LOW (ref 80.0–100.0)
Monocytes Absolute: 0.4 K/uL (ref 0.1–1.0)
Monocytes Relative: 5 %
Neutro Abs: 5.3 K/uL (ref 1.7–7.7)
Neutrophils Relative %: 63 %
Platelets: 251 K/uL (ref 150–400)
RBC: 3.51 MIL/uL — ABNORMAL LOW (ref 3.87–5.11)
RDW: 26.2 % — ABNORMAL HIGH (ref 11.5–15.5)
WBC: 8.4 K/uL (ref 4.0–10.5)
nRBC: 0.2 % (ref 0.0–0.2)

## 2024-06-11 LAB — BASIC METABOLIC PANEL WITH GFR
Anion gap: 12 (ref 5–15)
BUN: <5 mg/dL — ABNORMAL LOW (ref 6–20)
CO2: 21 mmol/L — ABNORMAL LOW (ref 22–32)
Calcium: 8.7 mg/dL — ABNORMAL LOW (ref 8.9–10.3)
Chloride: 102 mmol/L (ref 98–111)
Creatinine, Ser: 0.73 mg/dL (ref 0.44–1.00)
GFR, Estimated: >60 mL/min (ref >60)
Glucose, Bld: 110 mg/dL — ABNORMAL HIGH (ref 70–99)
Potassium: 3.5 mmol/L (ref 3.5–5.1)
Sodium: 135 mmol/L (ref 135–145)

## 2024-06-11 MED ORDER — SENNOSIDES-DOCUSATE SODIUM 8.6-50 MG PO TABS: 1.0000 | ORAL_TABLET | Freq: Every day | ORAL | Status: DC

## 2024-06-11 MED ORDER — PANTOPRAZOLE SODIUM 40 MG PO TBEC
40.0000 mg | DELAYED_RELEASE_TABLET | Freq: Every day | ORAL | Status: DC
  Administered 2024-06-11: 40 mg via ORAL
  Filled 2024-06-11: qty 1

## 2024-06-11 MED ORDER — SCOPOLAMINE 1 MG/3DAYS TD PT72
1.0000 | MEDICATED_PATCH | TRANSDERMAL | 0 refills | Status: AC
  Filled 2024-06-11: qty 10, 30d supply, fill #0

## 2024-06-11 MED ORDER — PANTOPRAZOLE SODIUM 40 MG PO TBEC
40.0000 mg | DELAYED_RELEASE_TABLET | Freq: Every day | ORAL | 3 refills | Status: DC
  Filled 2024-06-11: qty 60, 60d supply, fill #0

## 2024-06-11 MED ORDER — PROMETHAZINE HCL 25 MG RE SUPP
25.0000 mg | Freq: Four times a day (QID) | RECTAL | 0 refills | Status: DC | PRN
  Filled 2024-06-11: qty 12, 3d supply, fill #0

## 2024-06-11 MED ORDER — POLYETHYLENE GLYCOL 3350 17 G PO PACK: 17.0000 g | PACK | Freq: Every day | ORAL | Status: DC

## 2024-06-11 NOTE — Plan of Care (Signed)
 Lynn Wilcox is well appearing and pleasant. She was able to take all of her medications today and walked independently to the restroom several times. Though she states she is still experiencing vaginal spotting with clots, she is adamant about following up with GYN.

## 2024-06-11 NOTE — Assessment & Plan Note (Addendum)
 S/p Zofran , Reglan , Pepcid , Ativan , IVF in ED with improving nausea. Continued dry heaving and spitting up saliva.  UDS positive for THC.  Urine pregnancy test negative. Recent CTA&P 8/28 with possible mild colitis. Her N/V improved this morning. Pt states she is able to tolerated PO intake  - Regular diet - VTE prophylaxis : SCD - Plan on repeat BMP in the afternoon - Supplement electrolyte as needed - Scopolamine  patch 1 mg Q72H - Zofran  4 mg tab Q8H PRM - TIGAN  200 mg Q6H PRN - Phenergan  SR 25 mg Q6H - Considering Capsaicin cream to help with N/V

## 2024-06-11 NOTE — TOC Transition Note (Signed)
 Transition of Care [facility]) - Discharge Note   Patient Details  Name: Lynn Wilcox MRN: [id number] Date of Birth: [birth year]  Transition of Care [facility]) CM/SW Contact:  Tom-Johnson, Aditi Rovira Daphne, RN Phone Number: 06/11/2024, 4:50 PM   Clinical Narrative:     Patient is scheduled for discharge today.  Readmission Risk Assessment done. Outpatient f/u, hospital f/u and discharge instructions on AVS. Prescriptions sent to [facility] pharmacy and patient will receive meds prior discharge. No ICM needs or recommendations noted. Significant other, Mac to transport at discharge.  No further ICM needs noted.      Final next level of care: Home/Self Care Barriers to Discharge: Barriers Resolved   Patient Goals and CMS Choice Patient states their goals for this hospitalization and ongoing recovery are:: To return home CMS Medicare.gov Compare Post Acute Care list provided to:: Patient Choice offered to / list presented to : NA      Discharge Placement                Patient to be transferred to facility by: Significant other Name of family member notified: Mac    Discharge Plan and Services Additional resources added to the After Visit Summary for                  DME Arranged: N/A DME Agency: NA       HH Arranged: NA HH Agency: NA        Social Drivers of Health (SDOH) Interventions SDOH Screenings   Food Insecurity: Food Insecurity Present (06/10/2024)  Housing: High Risk (06/10/2024)  Transportation Needs: Unmet Transportation Needs (06/10/2024)  Utilities: Not At Risk (06/10/2024)  Depression (PHQ2-9): Low Risk  (05/09/2024)  Social Connections: Unknown (02/13/2022)   Received from [facility]  Tobacco Use: Medium Risk (06/09/2024)     Readmission Risk Interventions    06/11/2024    4:44 PM  Readmission Risk Prevention Plan  Transportation Screening Complete  PCP or Specialist Appt within 5-7 Days Complete  Home Care Screening Complete  Medication Review (RN CM)  Referral to Pharmacy

## 2024-06-11 NOTE — Assessment & Plan Note (Signed)
 Known fibroids from TVUS in 2023.  Passing large clots recently, improved with recent Megace  course.  Recently received Depo shot, has Nexplanon  in placed. Patient states has been experiencing vaginal bleed in the last 2 years which she has been waiting for her GYN appointment. Last period started on 8/17-8/24 then again on Friday. H/H has been stable ~8/26 -  Hold Home Megestrol  400 mg/10 ml 120 mg daily -  Continue home iron every other day -  Outpatient follow-up with OB/GYN as planned -  Transfusion threshold hemoglobin < 7

## 2024-06-11 NOTE — Discharge Instructions (Addendum)
 Dear Lynn Wilcox,  Thank you for letting us  participate in your care. You were hospitalized for Intractable nausea and vomiting. You were treated with anti-nausea medications.  You can follow up with GI and OBGYN outpatient to further work up the vomiting and discuss hysterectomy.   Please take the Phenergan  suppository as needed for nausea. If you have increased vaginal bleeding please contact the [facility].   POST-HOSPITAL & CARE INSTRUCTIONS Go to your follow up appointments (listed below)   DOCTOR'S APPOINTMENT   Future Appointments  Date Time Provider Department Center  06/16/2024  2:30 PM Alena Morrison, Elio, MD [facility]    Follow-up Information     Janna Ferrier, DO. Schedule an appointment as soon as possible for a visit.   Specialty: Family Medicine Why: make an appointment ASAP for hospital follow up. Contact information: [address] [place] [address] [phone]                 Take care and be well!  Family Medicine Teaching Service Inpatient Team [place]  [facility]  [address] [place], [place] [address] [phone]

## 2024-06-11 NOTE — Progress Notes (Signed)
 Delivered TOC meds to patient's room with the patient and nurse permisson.

## 2024-06-11 NOTE — Progress Notes (Addendum)
 Daily Progress Note Intern Pager: [phone]  Patient name: Lynn Wilcox Medical record number: [id number] Date of birth: 07/31/1981 Age: 43 y.o. Gender: female  Primary Care Provider: Janna Ferrier, DO Consultants: None Code Status: Full  Pt Overview and Major Events to Date:  06/10/24 : Admitted  Assessment & Plan Intractable nausea and vomiting S/p Zofran , Reglan , Pepcid , Ativan , IVF in ED with improving nausea. Continued dry heaving and spitting up saliva.  UDS positive for THC.  Urine pregnancy test negative. Recent CTA&P 8/28 with possible mild colitis. Her N/V improved this morning. Pt states she is able to tolerated PO intake  - Regular diet - VTE prophylaxis : SCD - Plan on repeat BMP in the afternoon - Supplement electrolyte as needed - Scopolamine  patch 1 mg Q72H - Zofran  4 mg tab Q8H PRM - TIGAN  200 mg Q6H PRN - Phenergan  SR 25 mg Q6H - Considering Capsaicin cream to help with N/V Vaginal bleeding Microcytic anemia Known fibroids from TVUS in 2023.  Passing large clots recently, improved with recent Megace  course.  Recently received Depo shot, has Nexplanon  in placed. Patient states has been experiencing vaginal bleed in the last 2 years which she has been waiting for her GYN appointment. Last period started on 8/17-8/24 then again on Friday. H/H has been stable ~8/26 -  Hold Home Megestrol  400 mg/10 ml 120 mg daily -  Continue home iron every other day -  Outpatient follow-up with OB/GYN as planned -  Transfusion threshold hemoglobin < 7  Hypokalemia AM potassium is 3.5. Received 40 meqx2 PO Potassium OVN  - Repeat PM BMP  FEN/GI: Regular diet, Bowel regimens : MiraLax , Senokot-S PPx: Protonix  40 mg daily  Dispo:Home   Subjective:  Doing well OVN. Her N/V has improved. Able to tolerated PO   Objective: Temp:  [98.1 F (36.7 C)-98.7 F (37.1 C)] 98.2 F (36.8 C) (09/03 0752) Pulse Rate:  [90-94] 94 (09/03 0752) Resp:  [18-19] 18 (09/03 0752) BP:  (133-162)/(82-108) 162/108 (09/03 0752) SpO2:  [100 %] 100 % (09/03 0752)  Physical Exam Constitutional:      Appearance: She is well-developed.  Cardiovascular:     Rate and Rhythm: Normal rate.  Pulmonary:     Effort: Pulmonary effort is normal.     Breath sounds: Normal breath sounds.  Abdominal:     Palpations: Abdomen is soft.  Skin:    General: Skin is warm and dry.     Capillary Refill: Capillary refill takes less than 2 seconds.  Neurological:     General: No focal deficit present.     Mental Status: She is alert and oriented to person, place, and time.  Psychiatric:        Mood and Affect: Mood normal.        Behavior: Behavior normal.      Laboratory: Most recent CBC Lab Results  Component Value Date   WBC 8.4 06/11/2024   HGB 8.1 (L) 06/11/2024   HCT 26.6 (L) 06/11/2024   MCV 75.8 (L) 06/11/2024   PLT 251 06/11/2024   Most recent BMP    Latest Ref Rng & Units 06/11/2024    3:48 AM  BMP  Glucose 70 - 99 mg/dL 889   BUN 6 - 20 mg/dL <5   Creatinine 9.55 - 1.00 mg/dL 9.26   Sodium 864 - 854 mmol/L 135   Potassium 3.5 - 5.1 mmol/L 3.5   Chloride 98 - 111 mmol/L 102   CO2 22 - 32  mmol/L 21   Calcium 8.9 - 10.3 mg/dL 8.7      Imaging/Diagnostic Tests: LENNOX Suzen Houston KATHEE, DO 06/11/2024, 8:56 AM  PGY-1, [facility] FPTS Intern pager: [phone], text pages welcome Secure chat group [employer] [facility] Teaching Service

## 2024-06-11 NOTE — Discharge Summary (Signed)
 Family Medicine Teaching [facility] Discharge Summary  Patient name: Lynn Wilcox Medical record number: [id number] Date of birth: 05/18/1981 Age: 43 y.o. Gender: female Date of Admission: 06/09/2024  Date of Discharge: 9/3/125 Admitting Physician: Camie LITTIE Mulch, MD  Primary Care Provider: Janna Ferrier, DO Consultants: None  Indication for Hospitalization: Intractable nausea and vomiting  Discharge Diagnoses/Problem List:  Principal Problem for Admission: Intractable nausea and vomiting Other Problems addressed during stay:  Principal Problem:   Intractable nausea and vomiting Active Problems:   Vaginal bleeding   Microcytic anemia   Emesis, persistent   Hypokalemia  Brief Hospital Course:  Lynn Wilcox is a 43 y.o.female with a history of GERD, asthma, migraines who was admitted to the [facility] Medicine Teaching Service at [facility] for intractable nausea and vomiting. Her hospital course is detailed below:  Intractable N/V Felt to be in the setting of cannabis hyperemesis versus hormonal fluctuations with known fibroids and heavy menstrual bleeding.  Received Zofran , Reglan , Ativan , Pepcid  in ED with continued N/V. Urine pregnancy negative. UDS +THC. Patient received fluids while unable to tolerate PO. Electrolytes were monitored and repleted as indicated. By time of discharge patient was tolerating PO without issue.  Vaginal bleeding Microcytic anemia Heavy vaginal bleeding that lightened up over admission.  She has known fibroids from transvaginal ultrasound in 2023.  She has Nexplanon  in place.  Confusion around specific Megace  dosing; however, patient reported not taking any of this at home.  Given improvement over admission, discharged with home iron supplementation follow-up with OB/GYN as planned.  Other chronic conditions were medically managed with home medications and formulary alternatives as necessary (asthma, GERD)  PCP Follow-up Recommendations: Stopped Megace   given she was not taking, consider also Provera  when bleeding recurs due to large fibroids Consider repeat BMP Assess continued nausea and vomiting Consider continued marijuana cessation counseling  Results/Tests Pending at Time of Discharge:  Unresulted Labs (From admission, onward)     Start     Ordered   06/12/24 0500  Basic metabolic panel  Tomorrow morning,   R       Question:  Specimen collection method  Answer:  IV Team=IV Team collect   06/11/24 1033   06/11/24 1400  Basic metabolic panel  Once,   R       Question:  Specimen collection method  Answer:  IV Team=IV Team collect   06/11/24 9376           Disposition: Home  Discharge Condition: Stable  Discharge Exam per Dr. Suzen:  Vitals:   06/11/24 0524 06/11/24 0752  BP: (!) 157/82 (!) 162/108  Pulse: 90 94  Resp: 18 18  Temp: 98.1 F (36.7 C) 98.2 F (36.8 C)  SpO2:  100%   Constitutional:      Appearance: She is well-developed.  Cardiovascular:     Rate and Rhythm: Normal rate.  Pulmonary:     Effort: Pulmonary effort is normal.     Breath sounds: Normal breath sounds.  Abdominal:     Palpations: Abdomen is soft.  Skin:    General: Skin is warm and dry.     Capillary Refill: Capillary refill takes less than 2 seconds.  Neurological:     General: No focal deficit present.     Mental Status: She is alert and oriented to person, place, and time.  Psychiatric:        Mood and Affect: Mood normal.        Behavior: Behavior normal.   Significant  Labs and Imaging:  Recent Labs  Lab 06/09/24 1723 06/10/24 0830 06/11/24 0348  WBC 9.8 8.7 8.4  HGB 9.6* 8.1* 8.1*  HCT 30.6* 26.5* 26.6*  PLT 310 249 251   Recent Labs  Lab 06/09/24 1723 06/09/24 2240 06/10/24 0830 06/10/24 1700 06/11/24 0348  NA 134*  --  136 133* 135  K 3.1*  --  2.8* 3.2* 3.5  CL 95*  --  102 101 102  CO2 21*  --  22 23 21*  GLUCOSE 132*  --  110* 129* 110*  BUN 6  --  <5* <5* <5*  CREATININE 0.77  --  0.77 0.68 0.73   CALCIUM 10.1  --  8.5* 8.3* 8.7*  MG  --  1.8  --   --   --   ALKPHOS 59  --   --   --   --   AST 13*  --   --   --   --   ALT 11  --   --   --   --   ALBUMIN 4.5  --   --   --   --    DG Chest 2 View Result Date: 06/09/2024 CLINICAL DATA:  Shortness of breath.  Vomiting. EXAM: CHEST - 2 VIEW COMPARISON:  CT 02/27/2024 FINDINGS: The cardiomediastinal contours are normal. Mild bronchial thickening. Pulmonary vasculature is normal. No consolidation, pleural effusion, or pneumothorax. No acute osseous abnormalities are seen. IMPRESSION: Mild bronchial thickening. Electronically Signed   By: Andrea Gasman M.D.   On: 06/09/2024 18:23    Discharge Medications:  Allergies as of 06/11/2024       Reactions   Coffee Bean Extract [coffea Arabica] Other (See Comments)   Throat itches   Tea Other (See Comments)   Throat itches   Yeast-derived Drug Products Other (See Comments)   Throat itches   Chocolate Other (See Comments)   Throat itches        Medication List     STOP taking these medications    dicyclomine  20 MG tablet Commonly known as: BENTYL    megestrol  400 MG/10ML suspension Commonly known as: MEGACE        TAKE these medications    albuterol  108 (90 Base) MCG/ACT inhaler Commonly known as: VENTOLIN  HFA Inhale 2 puffs into the lungs as needed for wheezing or shortness of breath.   cetirizine  10 MG tablet Commonly known as: ZYRTEC  TAKE 1 TABLET BY MOUTH DAILY What changed:  when to take this reasons to take this   ferrous sulfate  324 (65 Fe) MG Tbec Take 1 tablet (325 mg total) by mouth every other day.   fluticasone  50 MCG/ACT nasal spray Commonly known as: FLONASE  Place 2 sprays into both nostrils daily. What changed:  when to take this reasons to take this   ondansetron  4 MG disintegrating tablet Commonly known as: ZOFRAN -ODT Take 1 tablet (4 mg total) by mouth every 8 (eight) hours as needed for nausea or vomiting.   pantoprazole  40 MG  tablet Commonly known as: PROTONIX  Take 1 tablet (40 mg total) by mouth daily. What changed: when to take this   polyethylene glycol 17 g packet Commonly known as: MIRALAX  / GLYCOLAX  Take 17 g by mouth daily.   promethazine  25 MG suppository Commonly known as: PHENERGAN  Place 1 suppository (25 mg total) rectally every 6 (six) hours as needed for nausea or vomiting.   scopolamine  1 MG/3DAYS Commonly known as: TRANSDERM-SCOP Place 1 patch (1 mg total) onto the skin  every 3 (three) days. Start taking on: June 13, 2024   senna-docusate 8.6-50 MG tablet Commonly known as: Senokot-S Take 1 tablet by mouth daily.        Discharge Instructions: Please refer to Patient Instructions section of EMR for full details.  Patient was counseled important signs and symptoms that should prompt return to medical care, changes in medications, dietary instructions, activity restrictions, and follow up appointments.   Follow-Up Appointments:  Follow-up Information     Janna Ferrier, DO. Schedule an appointment as soon as possible for a visit.   Specialty: Family Medicine Why: make an appointment ASAP for hospital follow up. Contact information: [address] [place] [address] [phone]                 Tharon Lung, MD 06/11/2024, 4:19 PM PGY-3, [facility] Family Medicine

## 2024-06-11 NOTE — Assessment & Plan Note (Signed)
 AM potassium is 3.5. Received 40 meqx2 PO Potassium OVN  - Repeat PM BMP

## 2024-06-12 ENCOUNTER — Telehealth: Payer: Self-pay

## 2024-06-12 NOTE — Transitions of Care (Post Inpatient/ED Visit) (Signed)
   06/12/2024  Name: Jeslyn Amsler MRN: [id number] DOB: [birth year]  Today's TOC FU Call Status: Today's TOC FU Call Status:: Unsuccessful Call (1st Attempt) Unsuccessful Call (1st Attempt) Date: 06/12/24  Attempted to reach the patient regarding the most recent Inpatient/ED visit.  Follow Up Plan: Additional outreach attempts will be made to reach the patient to complete the Transitions of Care (Post Inpatient/ED visit) call.   Signature Julian Lemmings, LPN [facility] Nurse Health Advisor Direct Dial [phone]

## 2024-06-13 NOTE — Transitions of Care (Post Inpatient/ED Visit) (Signed)
   06/13/2024  Name: Lynn Wilcox MRN: [id number] DOB: 1981-02-14  Today's TOC FU Call Status: Today's TOC FU Call Status:: Unsuccessful Call (2nd Attempt) Unsuccessful Call (1st Attempt) Date: 06/12/24 Unsuccessful Call (2nd Attempt) Date: 06/13/24  Attempted to reach the patient regarding the most recent Inpatient/ED visit.  Follow Up Plan: Additional outreach attempts will be made to reach the patient to complete the Transitions of Care (Post Inpatient/ED visit) call.   Signature Julian Lemmings, LPN [facility] Nurse Health Advisor Direct Dial [phone]

## 2024-06-16 ENCOUNTER — Inpatient Hospital Stay: Payer: Self-pay

## 2024-06-16 NOTE — Transitions of Care (Post Inpatient/ED Visit) (Signed)
 06/16/2024  Name: Lynn Wilcox MRN: [id number] DOB: Jan 30, 1981  Today's TOC FU Call Status: Today's TOC FU Call Status:: Successful TOC FU Call Completed Unsuccessful Call (1st Attempt) Date: 06/12/24 Unsuccessful Call (2nd Attempt) Date: 06/13/24 [facility] FU Call Complete Date: 06/16/24 Patient's Name and Date of Birth confirmed.  Transition Care Management Follow-up Telephone Call Date of Discharge: 06/11/24 Discharge Facility: [facility]) Type of Discharge: Inpatient Admission Primary Inpatient Discharge Diagnosis:: nausea/ vomiting How have you been since you were released from the hospital?: Better Any questions or concerns?: No  Items Reviewed: Did you receive and understand the discharge instructions provided?: Yes Medications obtained,verified, and reconciled?: Yes (Medications Reviewed) Any new allergies since your discharge?: No Dietary orders reviewed?: Yes Do you have support at home?: Yes People in Home [RPT]: significant other  Medications Reviewed Today: Medications Reviewed Today     Reviewed by Emmitt Pan, LPN (Licensed Practical Nurse) on 06/16/24 at 1129  Med List Status: <None>   Medication Order Taking? Sig Documenting Provider Last Dose Status Informant  albuterol  (VENTOLIN  HFA) 108 (90 Base) MCG/ACT inhaler [id number] Yes Inhale 2 puffs into the lungs as needed for wheezing or shortness of breath. [provider]  Active Self, Pharmacy Records  cetirizine  (ZYRTEC ) 10 MG tablet [id number] Yes TAKE 1 TABLET BY MOUTH DAILY  Patient taking differently: Take 10 mg by mouth daily as needed for allergies.   Dayna Motto, DO  Active Self, Pharmacy Records  ferrous sulfate  324 (65 Fe) MG TBEC [id number] Yes Take 1 tablet (325 mg total) by mouth every other day. Joshua Domino, DO  Active Self, Pharmacy Records  fluticasone  (FLONASE ) 50 MCG/ACT nasal spray [id number] Yes Place 2 sprays into both nostrils daily.  Patient taking differently: Place 2 sprays  into both nostrils daily as needed for allergies.   Lorrane Pac, MD  Active Self, Pharmacy Records  ondansetron  (ZOFRAN -ODT) 4 MG disintegrating tablet [id number] Yes Take 1 tablet (4 mg total) by mouth every 8 (eight) hours as needed for nausea or vomiting. Daralene Lonni BIRCH, PA-C  Active Self, Pharmacy Records  pantoprazole  (PROTONIX ) 40 MG tablet [id number] Yes Take 1 tablet (40 mg total) by mouth daily. Tharon Lung, MD  Active   polyethylene glycol (MIRALAX  / GLYCOLAX ) 17 g packet [id number] Yes Take 17 g by mouth daily. Tharon Lung, MD  Active   promethazine  (PHENERGAN ) 25 MG suppository [id number] Yes Place 1 suppository (25 mg total) rectally every 6 (six) hours as needed for nausea or vomiting. Tharon Lung, MD  Active   scopolamine  (TRANSDERM-SCOP) 1 MG/3DAYS [id number] Yes Place 1 patch (1 mg total) onto the skin every 3 (three) days. Tharon Lung, MD  Active   senna-docusate (SENOKOT-S) 8.6-50 MG tablet [id number] Yes Take 1 tablet by mouth daily. Tharon Lung, MD  Active             Home Care and Equipment/Supplies: Were Home Health Services Ordered?: NA Any new equipment or medical supplies ordered?: NA  Functional Questionnaire: Do you need assistance with bathing/showering or dressing?: No Do you need assistance with meal preparation?: No Do you need assistance with eating?: No Do you have difficulty maintaining continence: No Do you need assistance with getting out of bed/getting out of a chair/moving?: No Do you have difficulty managing or taking your medications?: No  Follow up appointments reviewed: PCP Follow-up appointment confirmed?: Yes Date of PCP follow-up appointment?: 06/16/24 Follow-up Provider: [facility] Follow-up appointment confirmed?: NA Do you need transportation to your follow-up  appointment?: No Do you understand care options if your condition(s) worsen?: Yes-patient verbalized understanding    SIGNATURE Julian Lemmings, LPN [facility] Nurse Health Advisor Direct Dial [phone]

## 2024-06-18 ENCOUNTER — Encounter: Payer: Self-pay | Admitting: Student

## 2024-06-18 ENCOUNTER — Ambulatory Visit: Admitting: Student

## 2024-06-18 VITALS — BP 113/67 | HR 88 | Ht 63.0 in | Wt 252.4 lb

## 2024-06-18 DIAGNOSIS — R112 Nausea with vomiting, unspecified: Secondary | ICD-10-CM

## 2024-06-18 DIAGNOSIS — J4521 Mild intermittent asthma with (acute) exacerbation: Secondary | ICD-10-CM

## 2024-06-18 DIAGNOSIS — D649 Anemia, unspecified: Secondary | ICD-10-CM

## 2024-06-18 MED ORDER — FERROUS SULFATE 324 (65 FE) MG PO TBEC: 1.0000 | DELAYED_RELEASE_TABLET | ORAL | 2 refills | Status: AC

## 2024-06-18 MED ORDER — ALBUTEROL SULFATE HFA 108 (90 BASE) MCG/ACT IN AERS: 2.0000 | INHALATION_SPRAY | RESPIRATORY_TRACT | 1 refills | Status: AC | PRN

## 2024-06-18 NOTE — Patient Instructions (Signed)
 It was great to see you! Thank you for allowing me to participate in your care!   I recommend that you always bring your medications to each appointment as this makes it easy to ensure we are on the correct medications and helps us  not miss when refills are needed.  Our plans for today:  - I recommend you schedule an appointment for annual exam - Please make appointment to see OBGYN  Take care and seek immediate care sooner if you develop any concerns. Please remember to show up 15 minutes before your scheduled appointment time!  Gladis Church, DO [facility]

## 2024-06-18 NOTE — Assessment & Plan Note (Signed)
-   Refilled albuterol inhaler 

## 2024-06-18 NOTE — Assessment & Plan Note (Signed)
-  Iron refilled today - Follow-up with OB/GYN

## 2024-06-18 NOTE — Progress Notes (Signed)
     SUBJECTIVE:   CHIEF COMPLAINT / HPI:   Lynn Wilcox presents today for hospital follow up.   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  Since discharge, patient reports no symptoms. Abstained from marijuana use. Plans to call OBGYN for her appointment related to microcytic anemia from menorrhagia related to fibroids.  OBJECTIVE:   BP 113/67   Pulse 88   Ht 5' 3 (1.6 m)   Wt 252 lb 6 oz (114.5 kg)   LMP 05/18/2024 (Exact Date)   SpO2 100%   BMI 44.71 kg/m    General: NAD, pleasant Cardio: RRR, no MRG. Respiratory: CTAB, normal wob on RA GI: Abdomen is soft, not tender, not distended. BS present Skin: Warm and dry  ASSESSMENT/PLAN:   Assessment & Plan Nausea and vomiting, unspecified vomiting type Resolved.  Continue as needed medications.  Continue marijuana cessation. Anemia, unspecified type -Iron refilled today - Follow-up with OB/GYN Mild intermittent asthma with acute exacerbation -Refilled albuterol  inhaler   Gladis Church, DO [facility]

## 2024-06-18 NOTE — Assessment & Plan Note (Signed)
 Resolved.  Continue as needed medications.  Continue marijuana cessation.

## 2024-07-12 ENCOUNTER — Observation Stay (HOSPITAL_BASED_OUTPATIENT_CLINIC_OR_DEPARTMENT_OTHER)
Admission: EM | Admit: 2024-07-12 | Discharge: 2024-07-15 | DRG: 395 | Disposition: A | Attending: Emergency Medicine | Admitting: Emergency Medicine

## 2024-07-12 ENCOUNTER — Encounter (HOSPITAL_BASED_OUTPATIENT_CLINIC_OR_DEPARTMENT_OTHER): Payer: Self-pay

## 2024-07-12 ENCOUNTER — Emergency Department (HOSPITAL_BASED_OUTPATIENT_CLINIC_OR_DEPARTMENT_OTHER)

## 2024-07-12 ENCOUNTER — Other Ambulatory Visit: Payer: Self-pay

## 2024-07-12 DIAGNOSIS — N939 Abnormal uterine and vaginal bleeding, unspecified: Secondary | ICD-10-CM | POA: Diagnosis present

## 2024-07-12 DIAGNOSIS — E876 Hypokalemia: Secondary | ICD-10-CM | POA: Diagnosis present

## 2024-07-12 DIAGNOSIS — Z743 Need for continuous supervision: Secondary | ICD-10-CM | POA: Diagnosis not present

## 2024-07-12 DIAGNOSIS — D500 Iron deficiency anemia secondary to blood loss (chronic): Secondary | ICD-10-CM | POA: Diagnosis present

## 2024-07-12 DIAGNOSIS — Z79899 Other long term (current) drug therapy: Secondary | ICD-10-CM

## 2024-07-12 DIAGNOSIS — Z91018 Allergy to other foods: Secondary | ICD-10-CM

## 2024-07-12 DIAGNOSIS — R1111 Vomiting without nausea: Secondary | ICD-10-CM | POA: Diagnosis not present

## 2024-07-12 DIAGNOSIS — K219 Gastro-esophageal reflux disease without esophagitis: Secondary | ICD-10-CM | POA: Diagnosis present

## 2024-07-12 DIAGNOSIS — J45909 Unspecified asthma, uncomplicated: Secondary | ICD-10-CM | POA: Diagnosis present

## 2024-07-12 DIAGNOSIS — N920 Excessive and frequent menstruation with regular cycle: Secondary | ICD-10-CM

## 2024-07-12 DIAGNOSIS — F419 Anxiety disorder, unspecified: Secondary | ICD-10-CM | POA: Diagnosis present

## 2024-07-12 DIAGNOSIS — E860 Dehydration: Secondary | ICD-10-CM | POA: Diagnosis present

## 2024-07-12 DIAGNOSIS — R1115 Cyclical vomiting syndrome unrelated to migraine: Principal | ICD-10-CM | POA: Diagnosis present

## 2024-07-12 DIAGNOSIS — R000 Tachycardia, unspecified: Secondary | ICD-10-CM | POA: Diagnosis present

## 2024-07-12 DIAGNOSIS — R112 Nausea with vomiting, unspecified: Principal | ICD-10-CM | POA: Diagnosis present

## 2024-07-12 DIAGNOSIS — F32A Depression, unspecified: Secondary | ICD-10-CM | POA: Diagnosis present

## 2024-07-12 DIAGNOSIS — D649 Anemia, unspecified: Secondary | ICD-10-CM

## 2024-07-12 DIAGNOSIS — Z87891 Personal history of nicotine dependence: Secondary | ICD-10-CM

## 2024-07-12 DIAGNOSIS — Z6841 Body Mass Index (BMI) 40.0 and over, adult: Secondary | ICD-10-CM

## 2024-07-12 DIAGNOSIS — R110 Nausea: Secondary | ICD-10-CM | POA: Diagnosis not present

## 2024-07-12 DIAGNOSIS — E669 Obesity, unspecified: Secondary | ICD-10-CM | POA: Diagnosis present

## 2024-07-12 DIAGNOSIS — F1210 Cannabis abuse, uncomplicated: Secondary | ICD-10-CM | POA: Diagnosis present

## 2024-07-12 LAB — CBC WITH DIFFERENTIAL/PLATELET
Abs Immature Granulocytes: 0.02 K/uL (ref 0.00–0.07)
Basophils Absolute: 0 K/uL (ref 0.0–0.1)
Basophils Relative: 0 %
Eosinophils Absolute: 0 K/uL (ref 0.0–0.5)
Eosinophils Relative: 0 %
HCT: 32.3 % — ABNORMAL LOW (ref 36.0–46.0)
Hemoglobin: 9.5 g/dL — ABNORMAL LOW (ref 12.0–15.0)
Immature Granulocytes: 0 %
Lymphocytes Relative: 19 %
Lymphs Abs: 1.4 K/uL (ref 0.7–4.0)
MCH: 20.2 pg — ABNORMAL LOW (ref 26.0–34.0)
MCHC: 29.4 g/dL — ABNORMAL LOW (ref 30.0–36.0)
MCV: 68.7 fL — ABNORMAL LOW (ref 80.0–100.0)
Monocytes Absolute: 0.6 K/uL (ref 0.1–1.0)
Monocytes Relative: 9 %
Neutro Abs: 5.2 K/uL (ref 1.7–7.7)
Neutrophils Relative %: 72 %
Platelets: 493 K/uL — ABNORMAL HIGH (ref 150–400)
RBC: 4.7 MIL/uL (ref 3.87–5.11)
RDW: 22.5 % — ABNORMAL HIGH (ref 11.5–15.5)
WBC: 7.2 K/uL (ref 4.0–10.5)
nRBC: 0 % (ref 0.0–0.2)

## 2024-07-12 LAB — COMPREHENSIVE METABOLIC PANEL WITH GFR
ALT: 15 U/L (ref 0–44)
AST: 14 U/L — ABNORMAL LOW (ref 15–41)
Albumin: 4.8 g/dL (ref 3.5–5.0)
Alkaline Phosphatase: 66 U/L (ref 38–126)
Anion gap: 14 (ref 5–15)
BUN: 11 mg/dL (ref 6–20)
CO2: 30 mmol/L (ref 22–32)
Calcium: 10.8 mg/dL — ABNORMAL HIGH (ref 8.9–10.3)
Chloride: 95 mmol/L — ABNORMAL LOW (ref 98–111)
Creatinine, Ser: 0.92 mg/dL (ref 0.44–1.00)
GFR, Estimated: >60 mL/min (ref >60)
Glucose, Bld: 155 mg/dL — ABNORMAL HIGH (ref 70–99)
Potassium: 3.4 mmol/L — ABNORMAL LOW (ref 3.5–5.1)
Sodium: 140 mmol/L (ref 135–145)
Total Bilirubin: 0.4 mg/dL (ref 0.0–1.2)
Total Protein: 9.2 g/dL — ABNORMAL HIGH (ref 6.5–8.1)

## 2024-07-12 LAB — LIPASE, BLOOD: Lipase: 25 U/L (ref 11–51)

## 2024-07-12 LAB — TROPONIN T, HIGH SENSITIVITY: Troponin T High Sensitivity: <15 ng/L (ref 0–19)

## 2024-07-12 LAB — HCG, SERUM, QUALITATIVE: Preg, Serum: NEGATIVE

## 2024-07-12 MED ORDER — SODIUM CHLORIDE 0.9 % IV BOLUS
1000.0000 mL | Freq: Once | INTRAVENOUS | Status: AC
  Administered 2024-07-12: 1000 mL via INTRAVENOUS

## 2024-07-12 MED ORDER — LORAZEPAM 2 MG/ML IJ SOLN
0.5000 mg | Freq: Once | INTRAMUSCULAR | Status: AC
  Administered 2024-07-12: 0.5 mg via INTRAVENOUS
  Filled 2024-07-12: qty 1

## 2024-07-12 MED ORDER — LACTATED RINGERS IV SOLN: INTRAVENOUS | Status: DC

## 2024-07-12 MED ORDER — ALUM & MAG HYDROXIDE-SIMETH 200-200-20 MG/5ML PO SUSP
30.0000 mL | Freq: Once | ORAL | Status: AC
  Administered 2024-07-12: 30 mL via ORAL
  Filled 2024-07-12: qty 30

## 2024-07-12 MED ORDER — METOCLOPRAMIDE HCL 5 MG/ML IJ SOLN
5.0000 mg | Freq: Four times a day (QID) | INTRAMUSCULAR | Status: DC
  Administered 2024-07-12 – 2024-07-14 (×6): 5 mg via INTRAVENOUS
  Filled 2024-07-12 (×6): qty 2

## 2024-07-12 MED ORDER — ONDANSETRON HCL 4 MG/2ML IJ SOLN
4.0000 mg | Freq: Once | INTRAMUSCULAR | Status: AC
  Administered 2024-07-12: 4 mg via INTRAVENOUS
  Filled 2024-07-12: qty 2

## 2024-07-12 MED ORDER — METOCLOPRAMIDE HCL 5 MG/ML IJ SOLN
5.0000 mg | Freq: Once | INTRAMUSCULAR | Status: AC
  Administered 2024-07-12: 5 mg via INTRAVENOUS
  Filled 2024-07-12: qty 2

## 2024-07-12 MED ORDER — ONDANSETRON HCL 4 MG/2ML IJ SOLN
4.0000 mg | Freq: Three times a day (TID) | INTRAMUSCULAR | Status: DC | PRN
  Administered 2024-07-12: 4 mg via INTRAVENOUS
  Filled 2024-07-12: qty 2

## 2024-07-12 MED ORDER — POTASSIUM CHLORIDE 10 MEQ/100ML IV SOLN
10.0000 meq | INTRAVENOUS | Status: AC
  Administered 2024-07-12 – 2024-07-13 (×2): 10 meq via INTRAVENOUS
  Filled 2024-07-12 (×2): qty 100

## 2024-07-12 MED ORDER — PANTOPRAZOLE SODIUM 40 MG IV SOLR
40.0000 mg | INTRAVENOUS | Status: DC
  Administered 2024-07-12 – 2024-07-13 (×2): 40 mg via INTRAVENOUS
  Filled 2024-07-12 (×2): qty 10

## 2024-07-12 MED ORDER — ONDANSETRON HCL 4 MG/2ML IJ SOLN: 4.0000 mg | Freq: Three times a day (TID) | INTRAMUSCULAR | Status: DC

## 2024-07-12 MED ORDER — MEGESTROL ACETATE 40 MG PO TABS
40.0000 mg | ORAL_TABLET | Freq: Every day | ORAL | Status: DC
  Administered 2024-07-12 – 2024-07-15 (×4): 40 mg via ORAL
  Filled 2024-07-12 (×4): qty 1

## 2024-07-12 NOTE — Assessment & Plan Note (Signed)
 Hemoglobin 9.5 on admission, stable with baseline. OBGYN consulted in the ED who recommended Megace  daily. Do not suspect this chronic bleeding in the setting of known fibroids is contributing to pt's current presentation. She does have a Nexplanon  and received IM Depo-provera  8/29 due to persistent bleeding.  - Continue Megace  40mg  daily - Continue home ferrous sulfate  325mg  every other day - Recommend outpatient OBGYN follow up

## 2024-07-12 NOTE — ED Notes (Signed)
 Called and spoke to lab who states they can add troponin onto initial blood work.

## 2024-07-12 NOTE — Assessment & Plan Note (Signed)
 S/p IVF, Reglan , Zofran  and Maalox in the ED with minimal improvement. Recently admitted for similar symptoms that improved with antiemetics and IVF.  - Admitted to [facility]MT[facility], Dr. Rumball, med-telemetry - LR 125ml/h IVF - NPO, advance diet as tolerated - Cardiac telemetry - Remove scopolamine  patch - Reglan  5mg  q6h, Zofran  4mg  q8h PRN - IV Protonix  40mg  daily - UDS, UA - Consider abdominal imaging if symptoms do not improve or abdominal pain worsens - am BMP, Mag, phos

## 2024-07-12 NOTE — Progress Notes (Signed)
     Daily Progress Note Intern Pager: [phone]  Patient name: Lynn Wilcox Medical record number: [id number] Date of birth: [birth year] Age: 43 y.o. Gender: female  Primary Care Provider: Janna Ferrier, DO Consultants: None Code Status: Full code  Pt Overview and Major Events to Date:  10/4: Admitted  Assessment and Plan:  Lynn Wilcox is a 43 year old female with history of cyclic vomiting/marijuana hyperemesis presenting with intractable nausea and vomiting.  Initial workup in the ED, unremarkable.  Still pending UDS. Plan for antiemetic therapy, if patient does not improve or worsens we will consider repeat CTAP (CTAP in August with mild colitis).  Assessment & Plan Intractable nausea and vomiting Improved with Reglan /Zofran /one-time Ativan  from last night.  No vomiting this a.m. - LR 125ml/h IVF, DC this afternoon if tolerating p.o. - NPO, advance diet as tolerated - Cardiac telemetry - Reglan  5mg  q6h, Zofran  4mg  q8h PRN - IV Protonix  40mg  daily - UDS, UA - Consider abdominal imaging if symptoms do not improve or abdominal pain worsens Hypokalemia S/p 2 run IV K+. Pending am BMP - am BMP Vaginal bleeding - Continue Megace  40mg  daily - Continue home ferrous sulfate  325mg  every other day - Recommend outpatient OBGYN follow up  FEN/GI: N.p.o. PPx: SCDs, early ambulation Dispo: Pending clinical improvement  Subjective:  No vomiting since receiving her medications last night.  Objective: Temp:  [97.5 F (36.4 C)-99.8 F (37.7 C)] 99.8 F (37.7 C) (10/04 2130) Pulse Rate:  [64-147] 93 (10/04 2130) Resp:  [5-28] 17 (10/04 2130) BP: (110-160)/(68-113) 155/88 (10/04 2130) SpO2:  [98 %-100 %] 100 % (10/04 2130) Weight:  [105.2 kg] 105.2 kg (10/04 1152) Physical Exam: General: NAD, resting comfortably in bed Cardiovascular: RRR, no murmurs Respiratory: CTAB, normal breathing room air Abdomen: Soft, not tender, nondistended Extremities: Moving all 4 extremities, no  edema  Laboratory: Most recent CBC Lab Results  Component Value Date   WBC 7.2 07/12/2024   HGB 9.5 (L) 07/12/2024   HCT 32.3 (L) 07/12/2024   MCV 68.7 (L) 07/12/2024   PLT 493 (H) 07/12/2024   Most recent BMP    Latest Ref Rng & Units 07/12/2024   11:57 AM  BMP  Glucose 70 - 99 mg/dL 844   BUN 6 - 20 mg/dL 11   Creatinine 9.55 - 1.00 mg/dL 9.07   Sodium 864 - 854 mmol/L 140   Potassium 3.5 - 5.1 mmol/L 3.4   Chloride 98 - 111 mmol/L 95   CO2 22 - 32 mmol/L 30   Calcium 8.9 - 10.3 mg/dL 89.1     Howell Lunger, DO 07/12/2024, 10:32 PM  PGY-3, [place] FPTS Intern pager: [phone], text pages welcome Secure chat group [facility] Teaching Service

## 2024-07-12 NOTE — ED Provider Notes (Signed)
 [facility] EMERGENCY DEPARTMENT AT [facility] Provider Note   CSN: [id number] Arrival date & time: 07/12/24  1130     Patient presents with: Emesis and Abdominal Pain   Lynn Wilcox is a 43 y.o. female with a past medical history significant for asthma, anxiety, history of cyclic vomiting who presents to the ED due to numerous episodes of nonbloody, nonbilious emesis x 2 days.  Denies fever and chills.  Admits to some chest pain and shortness of breath which typically occurs with her emesis due to reflux.  No urinary or vaginal symptoms. Also notes she has had vaginal bleeding for the past 2 months which is atypical for her. Notes her bleeding has picked up over the past few days and is not passing small clots. Was seen on 8/29 for vaginal bleeding and given a depo injection.   History obtained from patient and past medical records. No interpreter used during encounter.       Prior to Admission medications   Medication Sig Start Date End Date Taking? Authorizing Provider  albuterol  (VENTOLIN  HFA) 108 (90 Base) MCG/ACT inhaler Inhale 2 puffs into the lungs as needed for wheezing or shortness of breath. 06/18/24   Howell Lunger, DO  cetirizine  (ZYRTEC ) 10 MG tablet TAKE 1 TABLET BY MOUTH DAILY Patient taking differently: Take 10 mg by mouth daily as needed for allergies. 07/07/20   Dayna Motto, DO  ferrous sulfate  324 (65 Fe) MG TBEC Take 1 tablet (325 mg total) by mouth every other day. 06/18/24   Howell Lunger, DO  fluticasone  (FLONASE ) 50 MCG/ACT nasal spray Place 2 sprays into both nostrils daily. Patient taking differently: Place 2 sprays into both nostrils daily as needed for allergies. 05/09/24   Lorrane Pac, MD  ondansetron  (ZOFRAN -ODT) 4 MG disintegrating tablet Take 1 tablet (4 mg total) by mouth every 8 (eight) hours as needed for nausea or vomiting. 06/05/24   Daralene Lonni BIRCH, PA-C  pantoprazole  (PROTONIX ) 40 MG tablet Take 1 tablet (40 mg total) by mouth  daily. 06/11/24   Tharon Lung, MD  polyethylene glycol (MIRALAX  / GLYCOLAX ) 17 g packet Take 17 g by mouth daily. 06/11/24   Tharon Lung, MD  promethazine  (PHENERGAN ) 25 MG suppository Place 1 suppository (25 mg total) rectally every 6 (six) hours as needed for nausea or vomiting. 06/11/24   Tharon Lung, MD  scopolamine  (TRANSDERM-SCOP) 1 MG/3DAYS Place 1 patch (1 mg total) onto the skin every 3 (three) days. 06/13/24   Tharon Lung, MD  senna-docusate (SENOKOT-S) 8.6-50 MG tablet Take 1 tablet by mouth daily. 06/11/24   Tharon Lung, MD    Allergies: Coffee bean extract [coffea arabica], Tea, Yeast-derived drug products, and Chocolate    Review of Systems  Constitutional:  Negative for fever.  Respiratory:  Positive for shortness of breath.   Cardiovascular:  Positive for chest pain.  Gastrointestinal:  Positive for nausea and vomiting. Negative for diarrhea.  Genitourinary:  Positive for vaginal bleeding.    Updated Vital Signs BP (!) 160/97   Pulse 82   Temp 98.6 F (37 C) (Oral)   Resp 14   Ht 5' 3 (1.6 m)   Wt 105.2 kg   SpO2 100%   BMI 41.10 kg/m   Physical Exam Vitals and nursing note reviewed.  Constitutional:      General: She is not in acute distress.    Appearance: She is not ill-appearing.  HENT:     Head: Normocephalic.  Eyes:     Pupils: Pupils  are equal, round, and reactive to light.  Cardiovascular:     Rate and Rhythm: Normal rate and regular rhythm.     Pulses: Normal pulses.     Heart sounds: Normal heart sounds. No murmur heard.    No friction rub. No gallop.  Pulmonary:     Effort: Pulmonary effort is normal.     Breath sounds: Normal breath sounds.  Abdominal:     General: Abdomen is flat. There is no distension.     Palpations: Abdomen is soft.     Tenderness: There is no abdominal tenderness. There is no guarding or rebound.     Comments: Abdomen soft, nondistended, nontender to palpation in all quadrants without guarding or peritoneal signs. No  rebound.   Musculoskeletal:        General: Normal range of motion.     Cervical back: Neck supple.  Skin:    General: Skin is warm and dry.  Neurological:     General: No focal deficit present.     Mental Status: She is alert.  Psychiatric:        Mood and Affect: Mood normal.        Behavior: Behavior normal.     (all labs ordered are listed, but only abnormal results are displayed) Labs Reviewed  COMPREHENSIVE METABOLIC PANEL WITH GFR - Abnormal; Notable for the following components:      Result Value   Potassium 3.4 (*)    Chloride 95 (*)    Glucose, Bld 155 (*)    Calcium 10.8 (*)    Total Protein 9.2 (*)    AST 14 (*)    All other components within normal limits  CBC WITH DIFFERENTIAL/PLATELET - Abnormal; Notable for the following components:   Hemoglobin 9.5 (*)    HCT 32.3 (*)    MCV 68.7 (*)    MCH 20.2 (*)    MCHC 29.4 (*)    RDW 22.5 (*)    Platelets 493 (*)    All other components within normal limits  LIPASE, BLOOD  HCG, SERUM, QUALITATIVE  URINALYSIS, ROUTINE W REFLEX MICROSCOPIC  TROPONIN T, HIGH SENSITIVITY    EKG: EKG Interpretation Date/Time:  Saturday July 12 2024 12:22:02 EDT Ventricular Rate:  83 PR Interval:  129 QRS Duration:  79 QT Interval:  371 QTC Calculation: 436 R Axis:   36  Text Interpretation: Sinus rhythm Atrial premature complex Abnormal R-wave progression, early transition No significant change since last tracing Confirmed by Doretha Folks ([id number]) on 07/12/2024 3:27:50 PM  Radiology: [facility] Chest Portable 1 View Result Date: 07/12/2024 CLINICAL DATA:  CP EXAM: PORTABLE CHEST - 1 VIEW COMPARISON:  06/09/2024 FINDINGS: No focal airspace consolidation, pleural effusion, or pneumothorax. No cardiomegaly.No acute fracture or destructive lesion. IMPRESSION: No acute cardiopulmonary abnormality. Electronically Signed   By: Rogelia Myers M.D.   On: 07/12/2024 12:48     Procedures   Medications Ordered in the ED  megestrol   (MEGACE ) tablet 40 mg (has no administration in time range)  sodium chloride  0.9 % bolus 1,000 mL (0 mLs Intravenous Stopped 07/12/24 1522)  ondansetron  (ZOFRAN ) injection 4 mg (4 mg Intravenous Given 07/12/24 1226)  alum & mag hydroxide-simeth (MAALOX/MYLANTA) 200-200-20 MG/5ML suspension 30 mL (30 mLs Oral Given 07/12/24 1609)  sodium chloride  0.9 % bolus 1,000 mL (0 mLs Intravenous Stopped 07/12/24 1810)  metoCLOPramide  (REGLAN ) injection 5 mg (5 mg Intravenous Given 07/12/24 1807)  Medical Decision Making Amount and/or Complexity of Data Reviewed External Data Reviewed: notes.    Details: Previous admission notes Labs: ordered. Decision-making details documented in ED Course. Radiology: ordered and independent interpretation performed. Decision-making details documented in ED Course.  Risk OTC drugs. Prescription drug management. Decision regarding hospitalization.   This patient presents to the ED for concern of N/V, this involves an extensive number of treatment options, and is a complaint that carries with it a high risk of complications and morbidity.  The differential diagnosis includes gastroenteritis, cannabinoid hyperemesis syndrome, pancreatitis, acute cholecystitis, etc  43 year old female with history of cyclic vomiting presents to the ED due to numerous episodes of nonbloody, nonbilious emesis x 2 days.  Also admits to ongoing vaginal bleeding x 2 months.  Upon arrival patient mildly tachycardic at 106 and afebrile.  On exam abdomen soft, nondistended, nontender. Low suspicion for acute abdomen, so will hold off on CT at this time.  Routine labs ordered to rule out electrolyte abnormalities and check kidney function.  IV fluids and Zofran  given.  Will add troponin, chest x-ray, and EKG due to chest pain which patient relates to reflux from emesis.  Will also check hemoglobin due to ongoing vaginal bleeding.  CBC with no leukocytosis.  Anemia  with hemoglobin at 9.5.  Thrombocytosis at 493.  CMP with mild hypokalemia at 3.4.  Hyperglycemia 155.  No anion gap.  Normal renal function.  Lipase normal.  Low suspicion for pancreatitis. EKG NSR. Troponin normal. Low suspicion for ACS. CXR personally reviewed and interpreted which is negative for signs of pneumonia, pneumothorax, or widened mediastinum.  Reassessed patient numerous times during her ED stay.  Heart rate has remained continuously elevated.  Patient was even ambulated in the ED and heart rate got up to 130s.  Unclear what is causing patient's tachycardia.  Appears that it has been elevated in the past.  Questionable symptomatic anemia?  Dehydration?  Patient received 2 L of IV fluids with no improvement in heart rate.  Denies any chest pain just notes she has some reflux which she describes as a burning sensation. Burning sensation improved after GI cocktail. Lower suspicion for PE.  Normal O2 saturation.   Given intractable nausea and vomiting and continued tachycardia feel patient would benefit for admission for further treatment.  6:49 PM Discussed with Dr. Nicholaus with OBGYN who recommends starting patient on Megace  40mg  daily. She will send message to office to get patient in for follow-up. Feels tachycardia is less likely from hgb 9.5 secondary to vaginal bleeding. OBGYN is happy to consult on patient during admission if needed.   Patient handed off to [facility], PA-C at shift change pending admission.   Co morbidities that complicate the patient evaluation  GERD, asthma, iron deficiency anemia Cardiac Monitoring: / EKG:  The patient was maintained on a cardiac monitor.  I personally viewed and interpreted the cardiac monitored which showed an underlying rhythm of: NSR  Social Determinants of Health:  Marijuana use  Test / Admission - Considered:  Patient will require admission for intractable nausea and vomiting.      Final diagnoses:  Nausea and vomiting,  unspecified vomiting type  Vaginal bleeding  Anemia, unspecified type    ED Discharge Orders     None          Lynn Wilcox 07/12/24 1916    Doretha Folks, MD 07/13/24 [phone]

## 2024-07-12 NOTE — ED Triage Notes (Signed)
 Arrives ambulatory to the ED with complaints of of vomiting x1 day.  Patient has a history to cyclical vomiting syndrome and voices concerns about dehydration.

## 2024-07-12 NOTE — Plan of Care (Signed)

## 2024-07-12 NOTE — ED Notes (Signed)
-  Called [facility] at 807pm for transportation to [facility].

## 2024-07-12 NOTE — Assessment & Plan Note (Addendum)
 S/p IVF, Reglan , Zofran  and Maalox in the ED with minimal improvement. Recently admitted for similar symptoms that improved with antiemetics and IVF.  - Admitted to [facility], Dr. Rumball, med-telemetry - LR 125ml/h IVF - NPO, advance diet as tolerated - Cardiac telemetry - Remove scopolamine  patch - Reglan  5mg  q6h, Zofran  4mg  q8h PRN - IV Protonix  40mg  daily - UDS, UA - Consider abdominal imaging if symptoms do not improve or abdominal pain worsens - am BMP, Mag, phos

## 2024-07-12 NOTE — Assessment & Plan Note (Signed)
 Potassium 3.4 on admission, likely secondary to poor PO intake and intractable vomiting. - IV Potassium 20meq - am BMP

## 2024-07-12 NOTE — Hospital Course (Addendum)
 Lynn Wilcox is a 43 y.o. female with history of  asthma, anxiety, depression, GERD, migraines, marijuana abuse, cyclic vomiting  who was admitted for intractable nausea/vomiting and tachycardia.  Intractable nausea and vomiting In setting of known cyclic vomiting and history of marijuana hyperemesis syndrome.  UDS***.  UPT negative. Prior CTAP 06/05/2024 showing mild colitis. Initial lab work and exam not concerning for significant intra-abdominal pathology-CT imaging was deferred.  Patient had improvement with***.  At time of discharge nausea and vomiting were well-controlled, and home regimen included***.  Tachycardia Sinus tachycardia likely multifactorial in setting of dehydration, vomiting, mild anemia.  Heart rate continue to improve with IV fluids and improvement of nausea/vomiting.  At time of discharge heart rate within normal limits***.  Heavy uterine bleeding ED provider consulted OB/GYN Dr. Nicholaus prior to admission.  Per Dr. Nicholaus, they recommend Megace  40 mg daily, and outpatient follow-up.  Ultimately, do not believe anemia was primary driver of her symptoms above.    Other chronic conditions were medically managed with home medications and formulary alternatives as necessary (iron deficiency anemia, GERD)  Follow-up recommendations

## 2024-07-12 NOTE — H&P (Cosign Needed Addendum)
 Hospital Admission History and Physical Service Pager: [phone]  Patient name: Lynn Wilcox Medical record number: [id number] Date of Birth: 1981-08-28 Age: 43 y.o. Gender: female  Primary Care Provider: Janna Ferrier, DO Consultants: None Code Status: Full  Preferred Emergency Contact:  Contact Information     Name Relation Home Work Mobile   Porter,Mac Friend [phone]        Other Contacts   None on File    Chief Complaint: Nausea and vomiting  Assessment and Plan: Lynn Wilcox is a 43 y.o. female presenting with intractable nausea and vomiting.  Differential diagnoses include cannabis hyperemesis syndrome, worsening GERD, viral gastroenteritis.  Suspect most likely cannabis hyperemesis syndrome given recent hospital admission for the same, will obtain UDS this hospitalization.  Do not suspect acute abdominal pathology given nonfocal abdominal exam however will monitor for changes. Most recent abdominal CT from 06/05/24 showed mild colitis, small hiatal hernia, and umbilical hernia.   Of note, patient has had chronic vaginal bleeding for the last 2 months suspected secondary to fibroids.  She has a Nexplanon .  She has not followed up outpatient with OB/GYN yet.  Do not suspect this is contributing to her current presentation. Assessment & Plan Intractable nausea and vomiting S/p IVF, Reglan , Zofran  and Maalox in the ED with minimal improvement. Recently admitted for similar symptoms that improved with antiemetics and IVF.  - Admitted to [facility]MT[facility], Dr. Rumball, med-telemetry - LR 125ml/h IVF - NPO, advance diet as tolerated - Cardiac telemetry - Remove scopolamine  patch - Reglan  5mg  q6h, Zofran  4mg  q8h PRN - IV Protonix  40mg  daily - UDS, UA - Consider abdominal imaging if symptoms do not improve or abdominal pain worsens - am BMP, Mag, phos Hypokalemia Potassium 3.4 on admission, likely secondary to poor PO intake and intractable vomiting. - IV Potassium 20meq - am  BMP Vaginal bleeding Hemoglobin 9.5 on admission, stable with baseline. OBGYN consulted in the ED who recommended Megace  daily. Do not suspect this chronic bleeding in the setting of known fibroids is contributing to pt's current presentation. She does have a Nexplanon  and received IM Depo-provera  8/29 due to persistent bleeding.  - Continue Megace  40mg  daily - Continue home ferrous sulfate  325mg  every other day - Recommend outpatient OBGYN follow up  Chronic and Stable Problems:  Asthma - restart PRN albuterol  as needed GERD - continue protonix  40mg  daily   FEN/GI: NPO, advance as tolerated VTE Prophylaxis: SCDs  Disposition: Med tele  History of Present Illness:  Lynn Wilcox is a 43 y.o. female presenting with nausea and nonbloody, nonbilious vomiting x 2 days. Reports that she has been unable to eat since then due to the vomiting. Also reports heartburn consistent with her prior GERD symptoms, and generalized abdominal pain. She applied her scopolamine  patch 2 days ago with no improvement. She has been unable to take her phenergan  suppositories at home so she has not had anything other than Maalox and the scopolamine  patch for nausea. Pt denies shortness of breath.  Recently admitted from 9/1 to 07/08/2024 for intractable nausea and vomiting in setting of cannabis hyperemesis vs hormonal fluctuations which improved with IVF resuscitation.   In the ED, patient was initially tachycardic.  Given 2L IV fluids, Zofran , Reglan , Maalox/mylanta.  ED provider also spoke with OB/GYN regarding patient's 2 months of vaginal bleeding due to concern that this was contributing to pt's tachycardia, who recommended 40 mg Megace  daily.  Review Of Systems: Per HPI  Pertinent Past Medical History: Anxiety Asthma GERD  Esophagitis Hiatal hernia Migraines  Remainder reviewed in history tab.   Pertinent Past Surgical History: EGD 2021  Remainder reviewed in history tab.  Pertinent Social  History: Tobacco use: Former - quit 2003 Alcohol use: Denies Other Substance ldz:Mzenmud last used marijuana last month  Pertinent Family History: Mother - stroke, HTN, HLD  Remainder reviewed in history tab.   Important Outpatient Medications: Albuterol  2 puffs PRN Ferrous sulfate  325mg  every other day Protonix  40mg  daily Phenergan  25mg  suppository q6h PRN - discharged with from hospital Scopolamine  patch 1mg  every three days - discharged with from hospital Senokot 8.6-50mg  daily  Remainder reviewed in medication history.   Objective: BP (!) 155/88 (BP Location: Left Wrist)   Pulse 93   Temp 99.8 F (37.7 C) (Oral)   Resp 17   Ht 5' 3 (1.6 m)   Wt 105.2 kg   SpO2 100%   BMI 41.10 kg/m  Exam: General: In mild distress secondary to vomiting ENTM: Dry mucous membranes Cardiovascular: RRR, no murmurs Respiratory: CTAB, breathing comfortably on RA Gastrointestinal: Soft, non-distended, diffusely TTP Neuro: A&Ox4  Labs:  CBC BMET  Recent Labs  Lab 07/12/24 1157  WBC 7.2  HGB 9.5*  HCT 32.3*  PLT 493*   Recent Labs  Lab 07/12/24 1157  NA 140  K 3.4*  CL 95*  CO2 30  BUN 11  CREATININE 0.92  GLUCOSE 155*  CALCIUM 10.8*    Troponin <15 Pregnancy negative  EKG: NSR 84 BPM, Qtc , no STT changes   Imaging Studies Performed:  CXR 07/12/24 IMPRESSION: No acute cardiopulmonary abnormality.   [name], DO 07/12/2024, 9:34 PM PGY-2, [facility]  FPTS Intern pager: [phone], text pages welcome Secure chat group [facility] Teaching Service

## 2024-07-12 NOTE — ED Provider Notes (Signed)
  Physical Exam   Vitals:   07/12/24 1740 07/12/24 1755 07/12/24 1805 07/12/24 1815  BP:    (!) 160/97  Pulse: (!) 110 (!) 111 (!) 147 82  Resp: (!) 21 (!) 28 15 14   Temp:      TempSrc:      SpO2: 100% 99% 100% 100%  Weight:      Height:         Physical Exam  Procedures  Procedures  ED Course / MDM    Medical Decision Making Amount and/or Complexity of Data Reviewed Labs: ordered. Radiology: ordered.  Risk OTC drugs. Prescription drug management. Decision regarding hospitalization.   Patient handed off at shift change from prior EDP Aleck Sheer, see their note for initial history, physical exam findings, lab interpretations, and initial assessment/plan.  Patient signed out pending hospitalist consult for admission.  Patient presenting with nausea/vomiting.  History of cannabinoid hyperemesis.  Patient also complains of 2 months of vaginal bleeding.  Abdomen is soft and nontender on exam, no imaging performed at this visit.  Patient has received Zofran  and Reglan , despite this that she is still unable to tolerate p.o.  Patient has also had intermittent bouts of tachycardia as high as 140's despite receiving 2 L of IV fluids in the emergency department this evening, she has not had any episodes of oxygen desaturation.  Prior EDP spoke with OB/GYN in regard to this patient's persistent 2 months of vaginal bleeding, plan is to start patient on 40 mg of Megace  daily with OB/GYN to reach out for follow-up.  OB/GYN does not feel that her tachycardia today is secondary to blood loss, as her hemoglobin of 9.5 is largely consistent with her baseline.  I spoke with Dr. Alfornia who agrees that this patient is appropriate for admission.        Glendia Rocky SAILOR, PA-C 07/12/24 1941    Patsey Lot, MD 07/12/24 2320

## 2024-07-12 NOTE — Plan of Care (Signed)
 Plan of Care Note for accepted transfer   Patient name: Lynn Wilcox FMW:[id number] DOB: [birth year]  Facility requesting transfer: [facility] Requesting Provider: Glendia Rocky SAILOR, PA-C   Reason for transfer: Intractable nausea and vomiting, vaginal bleeding Facility course: 43 year old female with history of asthma, anxiety, depression, GERD, migraines, marijuana abuse, cyclic vomiting presenting with 2-day history of nonbloody, nonbilious emesis.  Also reported having vaginal bleeding for the past 2 months.  Patient noted to be tachycardic (sinus tachycardia) but afebrile.  Slightly hypertensive.  Labs showing no leukocytosis, hemoglobin 9.5 (stable since labs done a month ago), potassium 3.4, chloride 95, creatinine 0.9, calcium 10.8, albumin 4.8, total protein 9.2, no elevation of lipase or LFTs, troponin negative, serum hCG negative, UA pending.  Chest x-ray negative for acute findings.  Case was discussed with OB/GYN (Dr. Nicholaus) regarding ongoing vaginal bleeding and they recommended starting the patient on Megace  40 mg daily with plan for outpatient follow-up although their service is happy to consult during admission if needed.  Patient was given Maalox, Megace , Reglan , Zofran , and 2 L IV fluids in the ED.  Plan of care: The patient is accepted for admission to Telemetry unit at [facility].  Family medicine teaching service to assume care upon arrival to [facility].  I have sent a secure chat message to on-call resident physicians Dr. Howell and Dr. Stoney to make them aware.  Until arrival, care as per EDP.  However, TRH available 24/7 for questions and assistance.  Nursing staff, please contact family medicine teaching service as soon as patient arrives to [facility].

## 2024-07-13 DIAGNOSIS — R112 Nausea with vomiting, unspecified: Secondary | ICD-10-CM

## 2024-07-13 DIAGNOSIS — F32A Depression, unspecified: Secondary | ICD-10-CM | POA: Diagnosis present

## 2024-07-13 DIAGNOSIS — D500 Iron deficiency anemia secondary to blood loss (chronic): Secondary | ICD-10-CM | POA: Diagnosis present

## 2024-07-13 DIAGNOSIS — E669 Obesity, unspecified: Secondary | ICD-10-CM | POA: Diagnosis present

## 2024-07-13 DIAGNOSIS — F419 Anxiety disorder, unspecified: Secondary | ICD-10-CM | POA: Diagnosis present

## 2024-07-13 DIAGNOSIS — F1210 Cannabis abuse, uncomplicated: Secondary | ICD-10-CM | POA: Diagnosis present

## 2024-07-13 DIAGNOSIS — Z79899 Other long term (current) drug therapy: Secondary | ICD-10-CM | POA: Diagnosis not present

## 2024-07-13 DIAGNOSIS — Z91018 Allergy to other foods: Secondary | ICD-10-CM | POA: Diagnosis not present

## 2024-07-13 DIAGNOSIS — R1115 Cyclical vomiting syndrome unrelated to migraine: Secondary | ICD-10-CM | POA: Diagnosis not present

## 2024-07-13 DIAGNOSIS — K219 Gastro-esophageal reflux disease without esophagitis: Secondary | ICD-10-CM | POA: Diagnosis present

## 2024-07-13 DIAGNOSIS — E876 Hypokalemia: Secondary | ICD-10-CM | POA: Diagnosis present

## 2024-07-13 DIAGNOSIS — J45909 Unspecified asthma, uncomplicated: Secondary | ICD-10-CM | POA: Diagnosis present

## 2024-07-13 DIAGNOSIS — N939 Abnormal uterine and vaginal bleeding, unspecified: Secondary | ICD-10-CM | POA: Diagnosis present

## 2024-07-13 DIAGNOSIS — R000 Tachycardia, unspecified: Secondary | ICD-10-CM | POA: Diagnosis present

## 2024-07-13 DIAGNOSIS — Z6841 Body Mass Index (BMI) 40.0 and over, adult: Secondary | ICD-10-CM | POA: Diagnosis not present

## 2024-07-13 DIAGNOSIS — E860 Dehydration: Secondary | ICD-10-CM | POA: Diagnosis present

## 2024-07-13 DIAGNOSIS — Z87891 Personal history of nicotine dependence: Secondary | ICD-10-CM | POA: Diagnosis not present

## 2024-07-13 LAB — URINALYSIS, ROUTINE W REFLEX MICROSCOPIC
Bilirubin Urine: NEGATIVE
Glucose, UA: NEGATIVE mg/dL
Ketones, ur: 20 mg/dL — AB
Nitrite: NEGATIVE
Protein, ur: 100 mg/dL — AB
RBC / HPF: >50 RBC/hpf (ref 0–5)
Specific Gravity, Urine: 1.024 (ref 1.005–1.030)
WBC, UA: >50 WBC/hpf (ref 0–5)
pH: 8 (ref 5.0–8.0)

## 2024-07-13 LAB — RAPID URINE DRUG SCREEN, HOSP PERFORMED
Amphetamines: NOT DETECTED
Barbiturates: NOT DETECTED
Benzodiazepines: NOT DETECTED
Cocaine: NOT DETECTED
Opiates: NOT DETECTED
Tetrahydrocannabinol: POSITIVE — AB

## 2024-07-13 LAB — BASIC METABOLIC PANEL WITH GFR
Anion gap: 11 (ref 5–15)
Anion gap: 9 (ref 5–15)
BUN: 7 mg/dL (ref 6–20)
BUN: 6 mg/dL (ref 6–20)
CO2: 25 mmol/L (ref 22–32)
CO2: 25 mmol/L (ref 22–32)
Calcium: 8.9 mg/dL (ref 8.9–10.3)
Calcium: 8.7 mg/dL — ABNORMAL LOW (ref 8.9–10.3)
Chloride: 101 mmol/L (ref 98–111)
Chloride: 102 mmol/L (ref 98–111)
Creatinine, Ser: 0.87 mg/dL (ref 0.44–1.00)
Creatinine, Ser: 0.89 mg/dL (ref 0.44–1.00)
GFR, Estimated: >60 mL/min (ref >60)
GFR, Estimated: >60 mL/min (ref >60)
Glucose, Bld: 109 mg/dL — ABNORMAL HIGH (ref 70–99)
Glucose, Bld: 111 mg/dL — ABNORMAL HIGH (ref 70–99)
Potassium: 3.2 mmol/L — ABNORMAL LOW (ref 3.5–5.1)
Potassium: 3.8 mmol/L (ref 3.5–5.1)
Sodium: 137 mmol/L (ref 135–145)
Sodium: 136 mmol/L (ref 135–145)

## 2024-07-13 LAB — MAGNESIUM: Magnesium: 2.3 mg/dL (ref 1.7–2.4)

## 2024-07-13 LAB — PHOSPHORUS: Phosphorus: 3.2 mg/dL (ref 2.5–4.6)

## 2024-07-13 MED ORDER — ONDANSETRON 4 MG PO TBDP
4.0000 mg | ORAL_TABLET | Freq: Three times a day (TID) | ORAL | Status: DC | PRN
  Administered 2024-07-14: 4 mg via ORAL
  Filled 2024-07-13: qty 1

## 2024-07-13 MED ORDER — FERROUS SULFATE 325 (65 FE) MG PO TABS
325.0000 mg | ORAL_TABLET | ORAL | Status: DC
  Administered 2024-07-13 – 2024-07-15 (×2): 325 mg via ORAL
  Filled 2024-07-13 (×3): qty 1

## 2024-07-13 MED ORDER — POTASSIUM CHLORIDE 10 MEQ/100ML IV SOLN
10.0000 meq | INTRAVENOUS | Status: AC
  Administered 2024-07-13 (×4): 10 meq via INTRAVENOUS
  Filled 2024-07-13 (×3): qty 100

## 2024-07-13 MED ORDER — POTASSIUM CHLORIDE 10 MEQ/100ML IV SOLN: 10.0000 meq | INTRAVENOUS | Status: DC

## 2024-07-13 MED ORDER — SCOPOLAMINE 1 MG/3DAYS TD PT72
1.0000 | MEDICATED_PATCH | TRANSDERMAL | Status: DC
  Administered 2024-07-13: 1 mg via TRANSDERMAL
  Filled 2024-07-13: qty 1

## 2024-07-13 NOTE — Plan of Care (Signed)

## 2024-07-14 DIAGNOSIS — R112 Nausea with vomiting, unspecified: Secondary | ICD-10-CM | POA: Diagnosis not present

## 2024-07-14 LAB — BASIC METABOLIC PANEL WITH GFR
Anion gap: 9 (ref 5–15)
BUN: <5 mg/dL — ABNORMAL LOW (ref 6–20)
CO2: 22 mmol/L (ref 22–32)
Calcium: 8.7 mg/dL — ABNORMAL LOW (ref 8.9–10.3)
Chloride: 103 mmol/L (ref 98–111)
Creatinine, Ser: 0.82 mg/dL (ref 0.44–1.00)
GFR, Estimated: >60 mL/min (ref >60)
Glucose, Bld: 113 mg/dL — ABNORMAL HIGH (ref 70–99)
Potassium: 3.5 mmol/L (ref 3.5–5.1)
Sodium: 134 mmol/L — ABNORMAL LOW (ref 135–145)

## 2024-07-14 LAB — CBC
HCT: 26.7 % — ABNORMAL LOW (ref 36.0–46.0)
Hemoglobin: 7.7 g/dL — ABNORMAL LOW (ref 12.0–15.0)
MCH: 20.1 pg — ABNORMAL LOW (ref 26.0–34.0)
MCHC: 28.8 g/dL — ABNORMAL LOW (ref 30.0–36.0)
MCV: 69.5 fL — ABNORMAL LOW (ref 80.0–100.0)
Platelets: 325 K/uL (ref 150–400)
RBC: 3.84 MIL/uL — ABNORMAL LOW (ref 3.87–5.11)
RDW: 21.3 % — ABNORMAL HIGH (ref 11.5–15.5)
WBC: 7.8 K/uL (ref 4.0–10.5)
nRBC: 0.3 % — ABNORMAL HIGH (ref 0.0–0.2)

## 2024-07-14 LAB — MAGNESIUM: Magnesium: 1.8 mg/dL (ref 1.7–2.4)

## 2024-07-14 MED ORDER — PANTOPRAZOLE SODIUM 40 MG PO TBEC
40.0000 mg | DELAYED_RELEASE_TABLET | Freq: Every day | ORAL | Status: DC
  Administered 2024-07-14 – 2024-07-15 (×2): 40 mg via ORAL
  Filled 2024-07-14 (×2): qty 1

## 2024-07-14 MED ORDER — METOCLOPRAMIDE HCL 5 MG/ML IJ SOLN: 5.0000 mg | Freq: Four times a day (QID) | INTRAMUSCULAR | Status: DC | PRN

## 2024-07-14 MED ORDER — ONDANSETRON 4 MG PO TBDP
4.0000 mg | ORAL_TABLET | Freq: Three times a day (TID) | ORAL | Status: DC | PRN
  Administered 2024-07-15: 4 mg via ORAL
  Filled 2024-07-14: qty 1

## 2024-07-14 NOTE — Assessment & Plan Note (Signed)
 Stable this AM at 3.5. Will continue to monitor.  - am BMP

## 2024-07-14 NOTE — Assessment & Plan Note (Addendum)
 Hgb 9.5-->7.7 today. Bleeding improved, likely some component of dilution 2/2 IVF. Will continue to monitor.  - Continue Megace  40mg  daily - Continue home ferrous sulfate  325mg  every other day - Recommend outpatient OBGYN follow up - Transfusion threshold Hgb <7 - am CBC

## 2024-07-14 NOTE — Plan of Care (Signed)

## 2024-07-14 NOTE — Assessment & Plan Note (Signed)
 Nausea well controlled this morning. 2 episodes of vomiting this a.m. UDS positive for THC. UA shows some dehydration.  - soft diet, advance as tolerated - Cardiac telemetry - Zofran  ODT 4mg  q8h PRN, Reglan  5mg  IV q6h PRN for refractory N/V, scopolamine  patch 1mg /3days (placed 10/5) - Protonix  40mg  daily, switched to oral today - Consider abdominal imaging if symptoms do not improve or abdominal pain worsens

## 2024-07-14 NOTE — Progress Notes (Addendum)
     Daily Progress Note Intern Pager: [phone]  Patient name: Lynn Wilcox Medical record number: [id number] Date of birth: Mar 05, 1981 Age: 43 y.o. Gender: female  Primary Care Provider: Janna Ferrier, DO Consultants: None Code Status: Full  Pt Overview and Major Events to Date:  10/4: Admitted   Medical Decision Making:  Lynn Wilcox is a 43 year old female with a PMH of cyclic vomiting/marijuana hyperemesis presenting with intractable nausea vomiting.  ED workup was unremarkable.  UDS positive for THC.  Patient on antiemetic therapy, 2 episodes of liquid vomit this morning. Tolerated soft diet well yesterday.  Assessment & Plan Intractable nausea and vomiting Nausea well controlled this morning. 2 episodes of vomiting this a.m. UDS positive for THC. UA shows some dehydration.  - soft diet, advance as tolerated - Cardiac telemetry - Zofran  ODT 4mg  q8h PRN, Reglan  5mg  IV q6h PRN for refractory N/V, scopolamine  patch 1mg /3days (placed 10/5) - Protonix  40mg  daily, switched to oral today - Consider abdominal imaging if symptoms do not improve or abdominal pain worsens Hypokalemia Stable this AM at 3.5. Will continue to monitor.  - am BMP Vaginal bleeding Hgb 9.5-->7.7 today. Bleeding improved, likely some component of dilution 2/2 IVF. Will continue to monitor.  - Continue Megace  40mg  daily - Continue home ferrous sulfate  325mg  every other day - Recommend outpatient OBGYN follow up - Transfusion threshold Hgb <7 - am CBC     FEN/GI: soft PPx: SCDs, early ambulation  Dispo: Pending clinical improvement.   Subjective:  Patient states she threw up twice this morning. It was just liquid. Denies any blood in the vomit. Denies any nausea or abdominal pain. Tolerated soft diet well yesterday.   Objective: Temp:  [98.2 F (36.8 C)-99.2 F (37.3 C)] 98.2 F (36.8 C) (10/06 0750) Pulse Rate:  [84-113] 94 (10/06 0750) Resp:  [16-18] 17 (10/06 0350) BP: (114-147)/(71-98) 121/71  (10/06 0350) SpO2:  [98 %-100 %] 100 % (10/06 0750) Physical Exam: General: NAD Cardiovascular: RRR, no m/r/g Respiratory: CTAB, normal work of breathing on room air  Abdomen: soft, non-distended, non-tender to palpation Extremities: warm, dry, no edema  Laboratory: Most recent CBC Lab Results  Component Value Date   WBC 7.8 07/14/2024   HGB 7.7 (L) 07/14/2024   HCT 26.7 (L) 07/14/2024   MCV 69.5 (L) 07/14/2024   PLT 325 07/14/2024   Most recent BMP    Latest Ref Rng & Units 07/14/2024    4:29 AM  BMP  Glucose 70 - 99 mg/dL 886   BUN 6 - 20 mg/dL <5   Creatinine 9.55 - 1.00 mg/dL 9.17   Sodium 864 - 854 mmol/L 134   Potassium 3.5 - 5.1 mmol/L 3.5   Chloride 98 - 111 mmol/L 103   CO2 22 - 32 mmol/L 22   Calcium 8.9 - 10.3 mg/dL 8.7    Imaging/Diagnostic Tests: No new imaging.   Lynn Raguel MATSU, DO 07/14/2024, 7:57 AM  PGY-1, [facility] Family Medicine FPTS Intern pager: [phone], text pages welcome Secure chat group [facility] Teaching Service

## 2024-07-14 NOTE — Plan of Care (Signed)
 Patient calm and cooperative A&O X4, medication tolerated well. Patient left with call bell in reach, side rails up and bed in on lowest position.    Problem: Education: Goal: Knowledge of General Education information will improve Description: Including pain rating scale, medication(s)/side effects and non-pharmacologic comfort measures Outcome: Progressing   Problem: Health Behavior/Discharge Planning: Goal: Ability to manage health-related needs will improve Outcome: Progressing   Problem: Clinical Measurements: Goal: Will remain free from infection Outcome: Progressing   Problem: Activity: Goal: Risk for activity intolerance will decrease Outcome: Progressing   Problem: Nutrition: Goal: Adequate nutrition will be maintained Outcome: Progressing   Problem: Coping: Goal: Level of anxiety will decrease Outcome: Progressing   Problem: Elimination: Goal: Will not experience complications related to bowel motility Outcome: Progressing   Problem: Pain Managment: Goal: General experience of comfort will improve and/or be controlled Outcome: Progressing   Problem: Skin Integrity: Goal: Risk for impaired skin integrity will decrease Outcome: Progressing

## 2024-07-15 ENCOUNTER — Telehealth (HOSPITAL_COMMUNITY): Payer: Self-pay | Admitting: Family Medicine

## 2024-07-15 ENCOUNTER — Other Ambulatory Visit (HOSPITAL_COMMUNITY): Payer: Self-pay

## 2024-07-15 ENCOUNTER — Telehealth: Payer: Self-pay | Admitting: Pharmacy Technician

## 2024-07-15 DIAGNOSIS — R112 Nausea with vomiting, unspecified: Secondary | ICD-10-CM | POA: Diagnosis not present

## 2024-07-15 LAB — BASIC METABOLIC PANEL WITH GFR
Anion gap: 8 (ref 5–15)
BUN: <5 mg/dL — ABNORMAL LOW (ref 6–20)
CO2: 23 mmol/L (ref 22–32)
Calcium: 8.9 mg/dL (ref 8.9–10.3)
Chloride: 103 mmol/L (ref 98–111)
Creatinine, Ser: 0.84 mg/dL (ref 0.44–1.00)
GFR, Estimated: >60 mL/min (ref >60)
Glucose, Bld: 115 mg/dL — ABNORMAL HIGH (ref 70–99)
Potassium: 3.3 mmol/L — ABNORMAL LOW (ref 3.5–5.1)
Sodium: 134 mmol/L — ABNORMAL LOW (ref 135–145)

## 2024-07-15 LAB — CBC
HCT: 27 % — ABNORMAL LOW (ref 36.0–46.0)
Hemoglobin: 8.1 g/dL — ABNORMAL LOW (ref 12.0–15.0)
MCH: 20.3 pg — ABNORMAL LOW (ref 26.0–34.0)
MCHC: 30 g/dL (ref 30.0–36.0)
MCV: 67.7 fL — ABNORMAL LOW (ref 80.0–100.0)
Platelets: 362 K/uL (ref 150–400)
RBC: 3.99 MIL/uL (ref 3.87–5.11)
RDW: 21 % — ABNORMAL HIGH (ref 11.5–15.5)
WBC: 8.7 K/uL (ref 4.0–10.5)
nRBC: 0.2 % (ref 0.0–0.2)

## 2024-07-15 MED ORDER — POTASSIUM CHLORIDE CRYS ER 20 MEQ PO TBCR
40.0000 meq | EXTENDED_RELEASE_TABLET | Freq: Four times a day (QID) | ORAL | Status: DC
  Administered 2024-07-15: 40 meq via ORAL
  Filled 2024-07-15: qty 2

## 2024-07-15 MED ORDER — PANTOPRAZOLE SODIUM 40 MG PO TBEC
40.0000 mg | DELAYED_RELEASE_TABLET | Freq: Every day | ORAL | 0 refills | Status: AC
  Filled 2024-07-15: qty 30, 30d supply, fill #0

## 2024-07-15 MED ORDER — MEGESTROL ACETATE 40 MG PO TABS
40.0000 mg | ORAL_TABLET | Freq: Every day | ORAL | 0 refills | Status: DC
  Filled 2024-07-15: qty 30, 30d supply, fill #0

## 2024-07-15 NOTE — Plan of Care (Signed)
  Problem: Education: Goal: Knowledge of General Education information will improve Description: Including pain rating scale, medication(s)/side effects and non-pharmacologic comfort measures Outcome: Progressing   Problem: Health Behavior/Discharge Planning: Goal: Ability to manage health-related needs will improve Outcome: Progressing   Problem: Clinical Measurements: Goal: Ability to maintain clinical measurements within normal limits will improve Outcome: Progressing Goal: Will remain free from infection Outcome: Progressing Goal: Diagnostic test results will improve Outcome: Progressing Goal: Respiratory complications will improve Outcome: Progressing Goal: Cardiovascular complication will be avoided Outcome: Progressing   Problem: Activity: Goal: Risk for activity intolerance will decrease Outcome: Progressing   Problem: Activity: Goal: Risk for activity intolerance will decrease Outcome: Progressing   Problem: Pain Managment: Goal: General experience of comfort will improve and/or be controlled Outcome: Progressing   Problem: Safety: Goal: Ability to remain free from injury will improve Outcome: Progressing   Problem: Skin Integrity: Goal: Risk for impaired skin integrity will decrease Outcome: Progressing

## 2024-07-15 NOTE — Assessment & Plan Note (Deleted)
 Nausea well controlled this morning.  - regular diet - Cardiac telemetry - Zofran  ODT 4mg  q8h PRN, Reglan  5mg  IV q6h PRN for refractory N/V, scopolamine  patch 1mg /3days (placed 10/5) - Protonix  40mg  daily, switched to oral today - Consider abdominal imaging if symptoms do not improve or abdominal pain worsens

## 2024-07-15 NOTE — Assessment & Plan Note (Deleted)
 Hgb 7.7-->8.1 today. Bleeding improved, likely some component of dilution 2/2 IVF. Will continue to monitor.  - Continue Megace  40mg  daily - Continue home ferrous sulfate  325mg  every other day - Recommend outpatient OBGYN follow up - Transfusion threshold Hgb <7 - am CBC

## 2024-07-15 NOTE — Progress Notes (Signed)
 Pt alert and oriented. No complaints of pain. Discharge information given to pt by RN. Pt verbalized understating. IV taking out.

## 2024-07-15 NOTE — Discharge Instructions (Signed)
 Dear [name],   Thank you for letting us  participate in your care! In this section, you will find a brief hospital admission summary of why you were admitted to the hospital, what happened during your admission, your diagnosis/diagnoses, and recommended follow up.   You were seen for nausea and vomiting secondary to cannabis use. Please abstain from any use to prevent symptom recurrence. You can continue to use the Scopolamine  patch as needed for nausea upon discharge.   POST-HOSPITAL & CARE INSTRUCTIONS We recommend following up with your PCP within 1 week from being discharged from the hospital. Please let PCP/Specialists know of any changes in medications that were made which you will be able to see in the medications section of this packet.  DOCTOR'S APPOINTMENTS & FOLLOW UP No future appointments.   Thank you for choosing [facility]! Take care and be well!  Family Medicine Teaching Service Inpatient Team [place]  [facility]  [address] [place], [place] [address] [phone]

## 2024-07-15 NOTE — Discharge Summary (Addendum)
 Family Medicine Teaching [facility] Discharge Summary  Patient name: Lynn Wilcox Medical record number: [id number] Date of birth: 13-Nov-1980 Age: 43 y.o. Gender: female Date of Admission: 07/12/2024  Date of Discharge: 07/15/24 Admitting Physician: Editha Ram, MD  Primary Care Provider: Janna Ferrier, DO Consultants: None  Indication for Hospitalization: Intractable nausea and vomiting   Discharge Diagnoses/Problem List:  Principal Problem for Admission: Intractable nausea and vomiting  Other Problems addressed during stay:  Principal Problem:   Intractable nausea and vomiting Active Problems:   Vaginal bleeding   Hypokalemia  Brief Hospital Course:  Toryn Dewalt is a 43 y.o. female with history of  asthma, anxiety, depression, GERD, migraines, marijuana abuse, cyclic vomiting  who was admitted for intractable nausea/vomiting and tachycardia.  Intractable nausea and vomiting In setting of known cyclic vomiting and history of marijuana hyperemesis syndrome.  UDS positive for THC.  UPT negative. Prior CTAP 06/05/2024 showing mild colitis. Initial lab work and exam not concerning for significant intra-abdominal pathology, CT imaging was deferred.  Patient had improvement with zofran  and reglan .  Diet advanced as tolerated. At time of discharge nausea and vomiting were well-controlled, and home regimen included Zofran  ODT, scopolamine  patch, and pantoprazole .  Tachycardia Sinus tachycardia likely multifactorial in setting of dehydration, vomiting, and mild anemia.  Heart rate continued to improve with IV fluids and improvement of nausea/vomiting.  At the time of discharge heart rate within normal limits.  Heavy uterine bleeding ED provider consulted OB/GYN Dr. Nicholaus prior to admission.  Per Dr. Nicholaus, they recommend Megace  40 mg daily, and outpatient follow-up.  Ultimately, do not believe anemia was primary driver of her symptoms above. Bleeding improved with Megace .    Other chronic conditions were medically managed with home medications and formulary alternatives as necessary (iron deficiency anemia, GERD)  Follow-up recommendations: Repeat anemia panel outpatient Repeat outpatient iron infusions  Results/Tests Pending at Time of Discharge: None  Disposition: Home   Discharge Condition: Medically stable for discharge   Discharge Exam:  Vitals:   07/15/24 0428 07/15/24 0717  BP: 125/77 127/84  Pulse: (!) 106 92  Resp: 17   Temp: 98.7 F (37.1 C) 98.2 F (36.8 C)  SpO2: 100% 100%   General: NAD Cardiovascular: RRR, no m/r/g Respiratory: CTAB, normal work of breathing on room air  Abdomen: soft, non-tender to palpation, no guarding, non-distended  Extremities: warm, dry, no edema   Significant Procedures: none  Significant Labs and Imaging:  Recent Labs  Lab 07/14/24 0429 07/15/24 0424  WBC 7.8 8.7  HGB 7.7* 8.1*  HCT 26.7* 27.0*  PLT 325 362   Recent Labs  Lab 07/13/24 1423 07/14/24 0429 07/15/24 0424  NA 136 134* 134*  K 3.8 3.5 3.3*  CL 102 103 103  CO2 25 22 23   GLUCOSE 111* 113* 115*  BUN 6 <5* <5*  CREATININE 0.89 0.82 0.84  CALCIUM 8.7* 8.7* 8.9  MG  --  1.8  --     Pertinent Imaging   None pertinent   Discharge Medications:  Allergies as of 07/15/2024       Reactions   Coffee Bean Extract [coffea Arabica] Other (See Comments)   Throat itches   Tea Other (See Comments)   Throat itches   Yeast-derived Drug Products Other (See Comments)   Throat itches   Chocolate Other (See Comments)   Throat itches        Medication List     STOP taking these medications    promethazine  25 MG  suppository Commonly known as: PHENERGAN        TAKE these medications    albuterol  108 (90 Base) MCG/ACT inhaler Commonly known as: VENTOLIN  HFA Inhale 2 puffs into the lungs as needed for wheezing or shortness of breath.   cetirizine  10 MG tablet Commonly known as: ZYRTEC  TAKE 1 TABLET BY MOUTH DAILY What  changed:  when to take this reasons to take this   ferrous sulfate  324 (65 Fe) MG Tbec Take 1 tablet (325 mg total) by mouth every other day.   fluticasone  50 MCG/ACT nasal spray Commonly known as: FLONASE  Place 2 sprays into both nostrils daily. What changed:  how much to take when to take this reasons to take this   megestrol  40 MG tablet Commonly known as: MEGACE  Take 1 tablet (40 mg total) by mouth daily. Start taking on: July 16, 2024   ondansetron  4 MG disintegrating tablet Commonly known as: ZOFRAN -ODT Take 1 tablet (4 mg total) by mouth every 8 (eight) hours as needed for nausea or vomiting.   pantoprazole  40 MG tablet Commonly known as: PROTONIX  Take 1 tablet (40 mg total) by mouth daily. Start taking on: July 16, 2024   scopolamine  1 MG/3DAYS Commonly known as: TRANSDERM-SCOP Place 1 patch (1 mg total) onto the skin every 3 (three) days. What changed:  when to take this additional instructions        Discharge Instructions: Please refer to Patient Instructions section of EMR for full details.  Patient was counseled important signs and symptoms that should prompt return to medical care, changes in medications, dietary instructions, activity restrictions, and follow up appointments.   Follow-Up Appointments:  Follow-up Information     [facility] [employer] [facility] [employer] [facility]. Schedule an appointment as soon as possible for a visit .   Contact information: [address] [place], [place] [address] [phone]        [facility] Follow up.   Why: 10/10 @ 10:10AM Contact information: [address] [place]   [address] [phone]                Lennie Raguel MATSU, DO 07/15/2024, 12:43 PM PGY-1, [facility] Family Medicine   I have discussed the above with Dr. Lennie and agree with the documented plan. My edits for correction/addition/clarification are included above. Please see any attending  notes.   Kathrine Melena, DO PGY-2,  Family Medicine 07/15/2024 2:07 PM

## 2024-07-15 NOTE — Progress Notes (Signed)
 Transition of Care [facility]) - Inpatient Brief Assessment   Patient Details  Name: Lynn Wilcox MRN: [id number] Date of Birth: 17-Jul-1981  Transition of Care [facility]) CM/SW Contact:    Rosaline JONELLE Joe, RN Phone Number: 07/15/2024, 9:24 AM   Clinical Narrative: Patient admitted from home with nausea, vomiting and hyperemesis relating to marijuana hyperemesis syndrome per MD notes.  Resources to be included in the AVS.  No other IP Care management needs at this time.   Transition of Care Asessment: Insurance and Status: (P) Insurance coverage has been reviewed Patient has primary care physician: (P) Yes Home environment has been reviewed: (P) from home Prior level of function:: (P) self Prior/Current Home Services: (P) No current home services Social Drivers of Health Review: (P) SDOH reviewed no interventions necessary Readmission risk has been reviewed: (P) Yes Transition of care needs: (P) no transition of care needs at this time

## 2024-07-15 NOTE — Assessment & Plan Note (Deleted)
 Stable this AM at 3.5. Will continue to monitor.  - am BMP

## 2024-07-15 NOTE — Telephone Encounter (Signed)
 Auth Submission: NO AUTH NEEDED Site of care: Site of care: [facility] Payer: [employer] Medication & CPT/J Code(s) submitted: Feraheme (ferumoxytol ) [id number] Diagnosis Code: D50.9 Route of submission (phone, fax, portal): PORTAL Phone # Fax # Auth type: Buy/Bill PB Units/visits requested: X2 DOSES Reference number:  Approval from: 07/15/24 to 10/08/24

## 2024-07-15 NOTE — Plan of Care (Signed)
 Patient calm and cooperative A&O X4. Tele called about patient  HR 140s, patient non symptomatic at time. Patient left with call bell in reach side rails up and bed in lowest position.  Problem: Education: Goal: Knowledge of General Education information will improve Description: Including pain rating scale, medication(s)/side effects and non-pharmacologic comfort measures Outcome: Progressing   Problem: Health Behavior/Discharge Planning: Goal: Ability to manage health-related needs will improve Outcome: Progressing   Problem: Clinical Measurements: Goal: Ability to maintain clinical measurements within normal limits will improve Outcome: Progressing   Problem: Activity: Goal: Risk for activity intolerance will decrease Outcome: Progressing   Problem: Nutrition: Goal: Adequate nutrition will be maintained Outcome: Progressing   Problem: Coping: Goal: Level of anxiety will decrease Outcome: Progressing   Problem: Elimination: Goal: Will not experience complications related to bowel motility Outcome: Progressing   Problem: Safety: Goal: Ability to remain free from injury will improve Outcome: Progressing   Problem: Skin Integrity: Goal: Risk for impaired skin integrity will decrease Outcome: Progressing

## 2024-07-15 NOTE — Telephone Encounter (Signed)
 Patient referred to infusion pharmacy team for ambulatory infusion of IV iron.  Insurance - [place] Medicaid Prepaid  Site of care - Site of care: [facility] Dx code - D50.0/N92.0  IV Iron Therapy - Feraheme 510 mg IV x 2  Infusion appointments - Scheduling team will schedule patient as soon as possible.    Chyenne Sobczak D. Dameshia Seybold, PharmD

## 2024-07-16 ENCOUNTER — Telehealth: Payer: Self-pay

## 2024-07-16 NOTE — Transitions of Care (Post Inpatient/ED Visit) (Unsigned)
   07/16/2024  Name: Clinton Wahlberg MRN: [id number] DOB: Mar 03, 1981  Today's TOC FU Call Status: Today's TOC FU Call Status:: Unsuccessful Call (1st Attempt) Unsuccessful Call (1st Attempt) Date: 07/16/24  Attempted to reach the patient regarding the most recent Inpatient/ED visit.  Follow Up Plan: Additional outreach attempts will be made to reach the patient to complete the Transitions of Care (Post Inpatient/ED visit) call.   Signature Julian Lemmings, LPN [facility] Nurse Health Advisor Direct Dial [phone]

## 2024-07-17 NOTE — Transitions of Care (Post Inpatient/ED Visit) (Signed)
 07/17/2024  Name: Lynn Wilcox MRN: [id number] DOB: [birth year]  Today's TOC FU Call Status: Today's TOC FU Call Status:: Successful TOC FU Call Completed Unsuccessful Call (1st Attempt) Date: 07/16/24 [facility] FU Call Complete Date: 07/17/24 Patient's Name and Date of Birth confirmed.  Transition Care Management Follow-up Telephone Call Date of Discharge: 07/15/24 Discharge Facility: [facility]) Type of Discharge: Inpatient Admission Primary Inpatient Discharge Diagnosis:: abn vaginal bleeding How have you been since you were released from the hospital?: Better Any questions or concerns?: No  Items Reviewed: Did you receive and understand the discharge instructions provided?: Yes Medications obtained,verified, and reconciled?: Yes (Medications Reviewed) Any new allergies since your discharge?: No Dietary orders reviewed?: Yes Do you have support at home?: Yes People in Home [RPT]: significant other  Medications Reviewed Today: Medications Reviewed Today     Reviewed by Emmitt Pan, LPN (Licensed Practical Nurse) on 07/17/24 at 1054  Med List Status: <None>   Medication Order Taking? Sig Documenting Provider Last Dose Status Informant  albuterol  (VENTOLIN  HFA) 108 (90 Base) MCG/ACT inhaler [id number] Yes Inhale 2 puffs into the lungs as needed for wheezing or shortness of breath. Howell Lunger, DO  Active Self, Pharmacy Records           Med Note (COFFELL, JON HERO   Sun Jul 13, 2024 10:59 AM)    cetirizine  (ZYRTEC ) 10 MG tablet [id number] Yes TAKE 1 TABLET BY MOUTH DAILY  Patient taking differently: Take 10 mg by mouth daily as needed for allergies.   Dayna Motto, DO  Active Self, Pharmacy Records  ferrous sulfate  324 (65 Fe) MG TBEC [id number] Yes Take 1 tablet (325 mg total) by mouth every other day. Howell Lunger, DO  Active Self, Pharmacy Records  fluticasone  (FLONASE ) 50 MCG/ACT nasal spray [id number] Yes Place 2 sprays into both nostrils daily.  Patient taking  differently: Place 1-2 sprays into both nostrils daily as needed for allergies.   Lorrane Pac, MD  Active Self, Pharmacy Records           Med Note (OMONYWA, CALVIN M   Sat Jul 12, 2024  9:44 PM) As needed  megestrol  (MEGACE ) 40 MG tablet [id number] Yes Take 1 tablet (40 mg total) by mouth daily. Janna Ferrier, DO  Active   ondansetron  (ZOFRAN -ODT) 4 MG disintegrating tablet [id number] Yes Take 1 tablet (4 mg total) by mouth every 8 (eight) hours as needed for nausea or vomiting. Daralene Lonni BIRCH, PA-C  Active Self, Pharmacy Records  pantoprazole  (PROTONIX ) 40 MG tablet [id number] Yes Take 1 tablet (40 mg total) by mouth daily. Gomes, Adriana, DO  Active   scopolamine  (TRANSDERM-SCOP) 1 MG/3DAYS [id number] Yes Place 1 patch (1 mg total) onto the skin every 3 (three) days.  Patient taking differently: Place 1 patch onto the skin See admin instructions. Apply 1 patch behind the ear every 72 hours - as needed for nausea, vomiting.   Tharon Lung, MD  Active Self, Pharmacy Records            Home Care and Equipment/Supplies: Were Home Health Services Ordered?: NA Any new equipment or medical supplies ordered?: NA  Functional Questionnaire: Do you need assistance with bathing/showering or dressing?: No Do you need assistance with meal preparation?: No Do you need assistance with eating?: No Do you have difficulty maintaining continence: No Do you need assistance with getting out of bed/getting out of a chair/moving?: No Do you have difficulty managing or taking your medications?: No  Follow up appointments reviewed: PCP  Follow-up appointment confirmed?: Yes Date of PCP follow-up appointment?: 07/18/24 Follow-up Provider: [facility] Follow-up appointment confirmed?: No Reason Specialist Follow-Up Not Confirmed: Patient has Specialist Provider Number and will Call for Appointment Do you need transportation to your follow-up appointment?: No Do you understand care  options if your condition(s) worsen?: Yes-patient verbalized understanding    SIGNATURE Julian Lemmings, LPN [facility] Nurse Health Advisor Direct Dial [phone]

## 2024-07-18 ENCOUNTER — Encounter: Payer: Self-pay | Admitting: Student

## 2024-07-18 ENCOUNTER — Ambulatory Visit (INDEPENDENT_AMBULATORY_CARE_PROVIDER_SITE_OTHER): Payer: Self-pay | Admitting: Student

## 2024-07-18 VITALS — BP 112/74 | HR 91 | Ht 63.0 in | Wt 241.4 lb

## 2024-07-18 DIAGNOSIS — R112 Nausea with vomiting, unspecified: Secondary | ICD-10-CM | POA: Diagnosis not present

## 2024-07-18 DIAGNOSIS — N920 Excessive and frequent menstruation with regular cycle: Secondary | ICD-10-CM | POA: Diagnosis not present

## 2024-07-18 DIAGNOSIS — D509 Iron deficiency anemia, unspecified: Secondary | ICD-10-CM | POA: Diagnosis not present

## 2024-07-18 DIAGNOSIS — D649 Anemia, unspecified: Secondary | ICD-10-CM | POA: Diagnosis present

## 2024-07-18 NOTE — Progress Notes (Signed)
    SUBJECTIVE:   CHIEF COMPLAINT / HPI:   43 year old female with history of GERD, asthma Presenting today for hospital follow-up Recently admitted for intractable nausea and vomiting Intractable nausea and vomiting was suspected to be due to [facility] use Reports symptoms have since improved since discharge from the hospital Has only had 1 episode of emesis Currently taking Zofran  as needed and needed it only twice  PERTINENT  PMH / PSH: Reviewed   OBJECTIVE:   BP 112/74   Pulse 91   Ht 5' 3 (1.6 m)   Wt 241 lb 6.4 oz (109.5 kg)   SpO2 100%   BMI 42.76 kg/m    Physical Exam General: Alert, well appearing, NAD Cardiovascular: RRR, No Murmurs, Normal S2/S2 Respiratory: CTAB, No wheezing or Rales Abdomen: No distension or tenderness Extremities: No edema on extremities   Skin: Warm and dry  ASSESSMENT/PLAN:   Microcytic anemia Suspect anemia due to heavy persistent menses which patient said has stopped recently with treatment.  Has 2 upcoming iron infusion treatment.  Will obtain CBC and iron studies today.  Intractable nausea and vomiting Appears to be well-controlled at this time.  Encourage patient to hold off marijuana use for at least 6 months.  Continue Zofran  as needed.  Menorrhagia with regular cycle Bleeding stopped with Megace .  Discussed possibly considering OCP for 3 months to help regulate given patient is currently on Nexplanon .     Norleen April, MD [facility] Medicine Center

## 2024-07-18 NOTE — Assessment & Plan Note (Signed)
 Suspect anemia due to heavy persistent menses which patient said has stopped recently with treatment.  Has 2 upcoming iron infusion treatment.  Will obtain CBC and iron studies today.

## 2024-07-18 NOTE — Patient Instructions (Signed)
 Pleasure to see you today.  Glad to hear that your nausea and vomiting is well-controlled.  You can continue to do the Zofran  as needed if you have nausea.  As discussed I would recommend holding off on marijuana use at least for the next 6 months.  I ordered some labs to check for your blood counts and iron levels.  Please make sure to complete your iron infusion therapy.

## 2024-07-18 NOTE — Assessment & Plan Note (Signed)
 Bleeding stopped with Megace .  Discussed possibly considering OCP for 3 months to help regulate given patient is currently on Nexplanon .

## 2024-07-18 NOTE — Assessment & Plan Note (Signed)
 Appears to be well-controlled at this time.  Encourage patient to hold off marijuana use for at least 6 months.  Continue Zofran  as needed.

## 2024-07-19 LAB — CBC
Hematocrit: 29.2 % — ABNORMAL LOW (ref 34.0–46.6)
Hemoglobin: 8 g/dL — ABNORMAL LOW (ref 11.1–15.9)
MCH: 20 pg — ABNORMAL LOW (ref 26.6–33.0)
MCHC: 27.4 g/dL — ABNORMAL LOW (ref 31.5–35.7)
MCV: 73 fL — ABNORMAL LOW (ref 79–97)
Platelets: 354 x10E3/uL (ref 150–450)
RBC: 4.01 x10E6/uL (ref 3.77–5.28)
RDW: 20.7 % — ABNORMAL HIGH (ref 11.7–15.4)
WBC: 7.1 x10E3/uL (ref 3.4–10.8)

## 2024-07-19 LAB — IRON,TIBC AND FERRITIN PANEL
Ferritin: 8 ng/mL — ABNORMAL LOW (ref 15–150)
Iron Saturation: 2 % — CL (ref 15–55)
Iron: 10 ug/dL — ABNORMAL LOW (ref 27–159)
Total Iron Binding Capacity: 461 ug/dL — ABNORMAL HIGH (ref 250–450)
UIBC: 451 ug/dL — ABNORMAL HIGH (ref 131–425)

## 2024-07-22 ENCOUNTER — Encounter (HOSPITAL_COMMUNITY)
Admission: RE | Admit: 2024-07-22 | Discharge: 2024-07-22 | Disposition: A | Discharge status: Home / Self Care | Source: Ambulatory Visit | Attending: Family Medicine | Admitting: Family Medicine

## 2024-07-22 ENCOUNTER — Ambulatory Visit: Payer: Self-pay | Admitting: Student

## 2024-07-22 VITALS — BP 119/76 | HR 95 | Temp 98.9°F | Resp 16

## 2024-07-22 DIAGNOSIS — N920 Excessive and frequent menstruation with regular cycle: Secondary | ICD-10-CM | POA: Insufficient documentation

## 2024-07-22 DIAGNOSIS — D500 Iron deficiency anemia secondary to blood loss (chronic): Secondary | ICD-10-CM | POA: Diagnosis present

## 2024-07-22 MED ORDER — SODIUM CHLORIDE 0.9 % IV SOLN
510.0000 mg | Freq: Once | INTRAVENOUS | Status: AC
  Administered 2024-07-22: 510 mg via INTRAVENOUS
  Filled 2024-07-22: qty 510

## 2024-07-29 ENCOUNTER — Encounter (HOSPITAL_COMMUNITY)

## 2024-08-06 ENCOUNTER — Inpatient Hospital Stay (HOSPITAL_COMMUNITY): Admission: RE | Admit: 2024-08-06 | Source: Ambulatory Visit

## 2024-08-15 ENCOUNTER — Ambulatory Visit (HOSPITAL_COMMUNITY)
Admission: RE | Admit: 2024-08-15 | Discharge: 2024-08-15 | Disposition: A | Discharge status: Home / Self Care | Source: Ambulatory Visit | Attending: Family Medicine | Admitting: Family Medicine

## 2024-08-15 ENCOUNTER — Other Ambulatory Visit: Payer: Self-pay

## 2024-08-15 VITALS — BP 126/75 | HR 77 | Temp 98.3°F | Resp 15

## 2024-08-15 DIAGNOSIS — D500 Iron deficiency anemia secondary to blood loss (chronic): Secondary | ICD-10-CM | POA: Insufficient documentation

## 2024-08-15 DIAGNOSIS — N920 Excessive and frequent menstruation with regular cycle: Secondary | ICD-10-CM | POA: Insufficient documentation

## 2024-08-15 MED ORDER — SODIUM CHLORIDE 0.9 % IV SOLN
510.0000 mg | Freq: Once | INTRAVENOUS | Status: AC
  Administered 2024-08-15: 510 mg via INTRAVENOUS
  Filled 2024-08-15: qty 510

## 2024-08-15 MED ORDER — MEGESTROL ACETATE 40 MG PO TABS: 40.0000 mg | ORAL_TABLET | Freq: Every day | ORAL | 0 refills | Status: DC

## 2024-08-15 NOTE — Telephone Encounter (Signed)
 Patient calls nurse line requesting refill on megace .   She reports that today is her last pill and needs refill prior to the weekend.   Forwarding to PCP.   Chiquita JAYSON English, RN

## 2024-09-16 ENCOUNTER — Encounter: Admitting: Family Medicine

## 2024-09-30 ENCOUNTER — Encounter: Payer: Self-pay | Admitting: Family Medicine

## 2024-09-30 ENCOUNTER — Ambulatory Visit: Admitting: Family Medicine

## 2024-09-30 VITALS — BP 122/79 | HR 85 | Wt 260.0 lb

## 2024-09-30 DIAGNOSIS — N920 Excessive and frequent menstruation with regular cycle: Secondary | ICD-10-CM

## 2024-09-30 DIAGNOSIS — Z1231 Encounter for screening mammogram for malignant neoplasm of breast: Secondary | ICD-10-CM

## 2024-09-30 DIAGNOSIS — Z3046 Encounter for surveillance of implantable subdermal contraceptive: Secondary | ICD-10-CM

## 2024-09-30 DIAGNOSIS — N871 Moderate cervical dysplasia: Secondary | ICD-10-CM | POA: Diagnosis not present

## 2024-09-30 DIAGNOSIS — D500 Iron deficiency anemia secondary to blood loss (chronic): Secondary | ICD-10-CM | POA: Diagnosis not present

## 2024-09-30 DIAGNOSIS — Z975 Presence of (intrauterine) contraceptive device: Secondary | ICD-10-CM

## 2024-09-30 NOTE — Progress Notes (Signed)
" ° ° °  Subjective:    Patient ID: Lynn Wilcox is a 43 y.o. female presenting with New Patient (Initial Visit)  on 09/30/2024  HPI: Having heavy menses and pain with cycles. She has been started on megace  40 mg 1x/day. She is on iron. Bleeding has lessened. Bleeding occurs whenever it wants to. Bleeding returns if off her Megace . Now on month 3. Has had Nexplanon  placed 04/2024. She also had abnormal pap with CIN2 on colpo with ECC showing CIN2 as well in 02/2024. H/o SVD x 2. Last u/s was 2023 and showed small fibroids.  Review of Systems  Constitutional:  Negative for chills and fever.  Respiratory:  Negative for shortness of breath.   Cardiovascular:  Negative for chest pain.  Gastrointestinal:  Negative for abdominal pain, nausea and vomiting.  Genitourinary:  Negative for dysuria.  Skin:  Negative for rash.      Objective:    BP 122/79   Pulse 85   Wt 260 lb (117.9 kg)   BMI 46.06 kg/m  Physical Exam Exam conducted with a chaperone present.  Constitutional:      General: She is not in acute distress.    Appearance: She is well-developed.  HENT:     Head: Normocephalic and atraumatic.  Eyes:     General: No scleral icterus. Cardiovascular:     Rate and Rhythm: Normal rate.  Pulmonary:     Effort: Pulmonary effort is normal.  Abdominal:     Palpations: Abdomen is soft.  Musculoskeletal:     Cervical back: Neck supple.  Skin:    General: Skin is warm and dry.  Neurological:     Mental Status: She is alert and oriented to person, place, and time.         Assessment & Plan:   Problem List Items Addressed This Visit       Unprioritized   Menorrhagia with regular cycle - Primary   Discussed options. She strongly desires hysterectomy. Will need EMB and pelvic sonogram prior to surgery. Will proceed with TVH. Risks include but are not limited to bleeding, infection, injury to surrounding structures, including bowel, bladder and ureters, blood clots, and death.   Likelihood of success is high.       Relevant Orders   US  PELVIC COMPLETE WITH TRANSVAGINAL   Ambulatory Referral For Surgery Scheduling   Nexplanon  in place   Will need this removed at subsequent visit.      Anemia due to blood loss   Continue iron repletion. Continue Megace  until surgery. Increase to BID to decrease on-going blood loss.       Dysplasia of cervix, high grade CIN 2   Declined LEEP as she prefers treatment with hysterectomy. She will need vaginal pap smears following hysterectomy.      Other Visit Diagnoses       Encounter for screening mammogram for malignant neoplasm of breast       Relevant Orders   MM 3D SCREENING MAMMOGRAM BILATERAL BREAST         Return in about 4 weeks (around 10/28/2024) for needs EMB, needs U/S.  Glenys GORMAN Birk, MD 09/30/2024 10:26 AM   "

## 2024-09-30 NOTE — Assessment & Plan Note (Addendum)
 Discussed options. She strongly desires hysterectomy. Will need EMB and pelvic sonogram prior to surgery. Will proceed with TVH. Risks include but are not limited to bleeding, infection, injury to surrounding structures, including bowel, bladder and ureters, blood clots, and death.  Likelihood of success is high.

## 2024-09-30 NOTE — Progress Notes (Signed)
 New pt, Abnormal pap, colpo- discussing options   Emesis with period cycle  Extended period length  Abdominal pain  Bleeding terrible  Large clots with period started out the size of silver dollars to palm size  Megace  08/15/24- has helped, lessened the duration of period  CT pelvis 06/05/24 Knows that she has 2 fibroids unsure of size    Discuss hysterectomy    Nexplanon  placed summer of 2025    Fainted last month  Now on Iron supplements

## 2024-09-30 NOTE — Assessment & Plan Note (Addendum)
 Will need this removed at subsequent visit.

## 2024-09-30 NOTE — Assessment & Plan Note (Signed)
 Declined LEEP as she prefers treatment with hysterectomy. She will need vaginal pap smears following hysterectomy.

## 2024-09-30 NOTE — Assessment & Plan Note (Signed)
 Continue iron repletion. Continue Megace  until surgery. Increase to BID to decrease on-going blood loss.

## 2024-10-01 ENCOUNTER — Encounter: Payer: Self-pay | Admitting: Family Medicine

## 2024-10-03 ENCOUNTER — Other Ambulatory Visit: Payer: Self-pay | Admitting: *Deleted

## 2024-10-03 MED ORDER — MEGESTROL ACETATE 40 MG PO TABS: 40.0000 mg | ORAL_TABLET | Freq: Two times a day (BID) | ORAL | 1 refills | Status: DC

## 2024-10-07 ENCOUNTER — Telehealth: Payer: Self-pay

## 2024-10-07 NOTE — Telephone Encounter (Signed)
 I called patient to schedule surgery with Dr. Fredirick on 10/28/24. I left a voicemail requesting a call back.

## 2024-10-12 NOTE — H&P (Signed)
 Lynn Wilcox is an 44 y.o. [id number] female.   Chief Complaint: Abnormal uterine bleeding, abnormal pap smear. HPI: Having heavy menses and pain with cycles. She has been started on megace  40 mg 1x/day. She is on iron. Bleeding has lessened. Bleeding occurs whenever it wants to. Bleeding returns if off her Megace . Now on month 3. Has had Nexplanon  placed 04/2024. She also had abnormal pap with CIN2 on colpo with ECC showing CIN2 as well in 02/2024. H/o SVD x 2. Last u/s was 2023 and showed small fibroids.  Past Medical History:  Diagnosis Date   Anxiety and depression 1995   Arthritis 2014   Asthma 1993   Uses inhaler prn   Diverticulosis    Esophagitis    H/O gastroesophageal reflux (GERD) 2013   Hiatal hernia    Hypokalemia    Migraines    Obesity    Seasonal allergies     Past Surgical History:  Procedure Laterality Date   BREAST BIOPSY     age 40   ESOPHAGOGASTRODUODENOSCOPY  05/2020   WISDOM TOOTH EXTRACTION      Family History  Problem Relation Age of Onset   Stroke Mother    Hypertension Mother    Hyperlipidemia Mother    COPD Father    Liver disease Father    Colon cancer Neg Hx    Stomach cancer Neg Hx    Esophageal cancer Neg Hx    Social History:  reports that she quit smoking about 23 years ago. Her smoking use included cigarettes. She has been exposed to tobacco smoke. She has never used smokeless tobacco. She reports current alcohol use. She reports current drug use. Frequency: 2.00 times per week. Drug: Marijuana.  Allergies: Allergies[1]  No medications prior to admission.    A comprehensive review of systems was negative.  There were no vitals taken for this visit. There were no vitals taken for this visit. General appearance: alert, cooperative, and appears stated age Head: Normocephalic, without obvious abnormality, atraumatic Neck: supple, symmetrical, trachea midline Lungs: normal effort Heart: regular rate and rhythm Extremities:  extremities normal, atraumatic, no cyanosis or edema Skin: Skin color, texture, turgor normal. No rashes or lesions Neurologic: Grossly normal   No results found for this or any previous visit (from the past 24 hours). No results found.  Assessment/Plan Active Problems:   Menorrhagia with regular cycle   Dysplasia of cervix, high grade CIN 2  For TVH with attempt at bilateral salpingectomy Risks include but are not limited to bleeding, infection, injury to surrounding structures, including bowel, bladder and ureters, blood clots, and death.  Likelihood of success is high.    Lynn Wilcox 10/12/2024, 10:45 PM        [1]  Allergies Allergen Reactions   Coffee Bean Extract [Coffea Arabica] Other (See Comments)    Throat itches   Tea Other (See Comments)    Throat itches   Yeast-Derived Drug Products Other (See Comments)    Throat itches   Chocolate Other (See Comments)    Throat itches

## 2024-10-14 ENCOUNTER — Emergency Department (HOSPITAL_BASED_OUTPATIENT_CLINIC_OR_DEPARTMENT_OTHER)
Admission: EM | Admit: 2024-10-14 | Discharge: 2024-10-15 | Disposition: A | Discharge status: Home / Self Care | Attending: Emergency Medicine | Admitting: Emergency Medicine

## 2024-10-14 ENCOUNTER — Encounter (HOSPITAL_BASED_OUTPATIENT_CLINIC_OR_DEPARTMENT_OTHER): Payer: Self-pay

## 2024-10-14 ENCOUNTER — Other Ambulatory Visit: Payer: Self-pay

## 2024-10-14 DIAGNOSIS — R112 Nausea with vomiting, unspecified: Secondary | ICD-10-CM | POA: Insufficient documentation

## 2024-10-14 DIAGNOSIS — R1013 Epigastric pain: Secondary | ICD-10-CM | POA: Insufficient documentation

## 2024-10-14 LAB — COMPREHENSIVE METABOLIC PANEL WITH GFR
ALT: 15 U/L (ref 0–44)
AST: 23 U/L (ref 15–41)
Albumin: 4.7 g/dL (ref 3.5–5.0)
Alkaline Phosphatase: 68 U/L (ref 38–126)
Anion gap: 16 — ABNORMAL HIGH (ref 5–15)
BUN: 8 mg/dL (ref 6–20)
CO2: 28 mmol/L (ref 22–32)
Calcium: 10.2 mg/dL (ref 8.9–10.3)
Chloride: 97 mmol/L — ABNORMAL LOW (ref 98–111)
Creatinine, Ser: 0.78 mg/dL (ref 0.44–1.00)
GFR, Estimated: >60 mL/min
Glucose, Bld: 145 mg/dL — ABNORMAL HIGH (ref 70–99)
Potassium: 3.5 mmol/L (ref 3.5–5.1)
Sodium: 141 mmol/L (ref 135–145)
Total Bilirubin: 0.4 mg/dL (ref 0.0–1.2)
Total Protein: 8.7 g/dL — ABNORMAL HIGH (ref 6.5–8.1)

## 2024-10-14 LAB — CBC
HCT: 32.9 % — ABNORMAL LOW (ref 36.0–46.0)
Hemoglobin: 10.2 g/dL — ABNORMAL LOW (ref 12.0–15.0)
MCH: 21.8 pg — ABNORMAL LOW (ref 26.0–34.0)
MCHC: 31 g/dL (ref 30.0–36.0)
MCV: 70.3 fL — ABNORMAL LOW (ref 80.0–100.0)
Platelets: 444 K/uL — ABNORMAL HIGH (ref 150–400)
RBC: 4.68 MIL/uL (ref 3.87–5.11)
RDW: 19.6 % — ABNORMAL HIGH (ref 11.5–15.5)
WBC: 12.6 K/uL — ABNORMAL HIGH (ref 4.0–10.5)
nRBC: 0 % (ref 0.0–0.2)

## 2024-10-14 LAB — LIPASE, BLOOD: Lipase: 29 U/L (ref 11–51)

## 2024-10-14 MED ORDER — DIPHENHYDRAMINE HCL 50 MG/ML IJ SOLN
25.0000 mg | Freq: Once | INTRAMUSCULAR | Status: AC
  Administered 2024-10-14: 25 mg via INTRAVENOUS
  Filled 2024-10-14: qty 1

## 2024-10-14 MED ORDER — METOCLOPRAMIDE HCL 5 MG/ML IJ SOLN
10.0000 mg | Freq: Once | INTRAMUSCULAR | Status: AC
  Administered 2024-10-14: 10 mg via INTRAVENOUS
  Filled 2024-10-14: qty 2

## 2024-10-14 MED ORDER — SODIUM CHLORIDE 0.9 % IV BOLUS
1000.0000 mL | Freq: Once | INTRAVENOUS | Status: AC
  Administered 2024-10-14: 1000 mL via INTRAVENOUS

## 2024-10-14 NOTE — ED Triage Notes (Signed)
 Pt c/o emesis x3 days, feeling of indigestion so my stomach's just burning. Normal home meds just aren't staying down. Reports poor PO r/t nausea. Hx GERD, it's not helping

## 2024-10-15 ENCOUNTER — Ambulatory Visit
Admission: RE | Admit: 2024-10-15 | Discharge: 2024-10-15 | Disposition: A | Discharge status: Home / Self Care | Source: Ambulatory Visit | Attending: Family Medicine | Admitting: Family Medicine

## 2024-10-15 DIAGNOSIS — N920 Excessive and frequent menstruation with regular cycle: Secondary | ICD-10-CM

## 2024-10-15 LAB — HCG, SERUM, QUALITATIVE: Preg, Serum: NEGATIVE

## 2024-10-15 MED ORDER — SODIUM CHLORIDE 0.9 % IV BOLUS
1000.0000 mL | Freq: Once | INTRAVENOUS | Status: AC
  Administered 2024-10-15: 1000 mL via INTRAVENOUS

## 2024-10-15 MED ORDER — DROPERIDOL 2.5 MG/ML IJ SOLN
1.2500 mg | Freq: Once | INTRAMUSCULAR | Status: AC
  Administered 2024-10-15: 1.25 mg via INTRAVENOUS
  Filled 2024-10-15: qty 2

## 2024-10-15 MED ORDER — DIPHENHYDRAMINE HCL 50 MG/ML IJ SOLN
25.0000 mg | Freq: Once | INTRAMUSCULAR | Status: AC
  Administered 2024-10-15: 25 mg via INTRAVENOUS
  Filled 2024-10-15: qty 1

## 2024-10-15 NOTE — Discharge Instructions (Signed)
 Please return for sudden worsening abdominal pain fever or inability to eat or drink.

## 2024-10-15 NOTE — ED Provider Notes (Signed)
 " [place] EMERGENCY DEPARTMENT AT [facility] Provider Note   CSN: [id number] Arrival date & time: 10/14/24  2000     Patient presents with: Emesis and Abdominal Pain   Lynn Wilcox is a 44 y.o. female.   44 yo F with a chief complaint of intractable nausea and vomiting.  Going on for about 3 days now.  She says this typically happens to her especially when she is on her menstrual cycle.  She had been taking Megace  without any planned breaks to try to stop her menstrual cycle.  She said have been working pretty well and then she started her cycle again today.  Has planned hysterectomy.  Abdominal pain is mostly epigastric.   Emesis Associated symptoms: abdominal pain   Abdominal Pain Associated symptoms: vomiting        Prior to Admission medications  Medication Sig Start Date End Date Taking? Authorizing Provider  albuterol  (VENTOLIN  HFA) 108 (90 Base) MCG/ACT inhaler Inhale 2 puffs into the lungs as needed for wheezing or shortness of breath. 06/18/24   Howell Lunger, DO  cetirizine  (ZYRTEC ) 10 MG tablet TAKE 1 TABLET BY MOUTH DAILY 07/07/20   Dayna Motto, DO  ferrous sulfate  324 (65 Fe) MG TBEC Take 1 tablet (325 mg total) by mouth every other day. 06/18/24   Howell Lunger, DO  fluticasone  (FLONASE ) 50 MCG/ACT nasal spray Place 2 sprays into both nostrils daily. 05/09/24   Lorrane Pac, MD  megestrol  (MEGACE ) 40 MG tablet Take 1 tablet (40 mg total) by mouth daily. 08/15/24   Gomes, Adriana, DO  megestrol  (MEGACE ) 40 MG tablet Take 1 tablet (40 mg total) by mouth 2 (two) times daily. Can increase to two tablets twice a day in the event of heavy bleeding 10/03/24   Fredirick Glenys RAMAN, MD  ondansetron  (ZOFRAN -ODT) 4 MG disintegrating tablet Take 1 tablet (4 mg total) by mouth every 8 (eight) hours as needed for nausea or vomiting. 06/05/24   Daralene Lonni BIRCH, PA-C  pantoprazole  (PROTONIX ) 40 MG tablet Take 1 tablet (40 mg total) by mouth daily. 07/16/24   Janna Ferrier,  DO  scopolamine  (TRANSDERM-SCOP) 1 MG/3DAYS Place 1 patch (1 mg total) onto the skin every 3 (three) days. 06/13/24   Tharon Lung, MD    Allergies: Coffee bean extract [coffea arabica], Tea, Yeast-derived drug products, and Chocolate    Review of Systems  Gastrointestinal:  Positive for abdominal pain and vomiting.    Updated Vital Signs BP (!) 173/124   Pulse 81   Temp 98.6 F (37 C)   Resp 20   SpO2 99%   Physical Exam Vitals and nursing note reviewed.  Constitutional:      General: She is not in acute distress.    Appearance: She is well-developed. She is not diaphoretic.     Comments: Patient is rocking back and forth in the room  HENT:     Head: Normocephalic and atraumatic.  Eyes:     Pupils: Pupils are equal, round, and reactive to light.  Cardiovascular:     Rate and Rhythm: Normal rate and regular rhythm.     Heart sounds: No murmur heard.    No friction rub. No gallop.  Pulmonary:     Effort: Pulmonary effort is normal.     Breath sounds: No wheezing or rales.  Abdominal:     General: There is no distension.     Palpations: Abdomen is soft.     Tenderness: There is no abdominal tenderness.  Comments: No obvious palpable abdominal discomfort.  Musculoskeletal:        General: No tenderness.     Cervical back: Normal range of motion and neck supple.  Skin:    General: Skin is warm and dry.  Neurological:     Mental Status: She is alert and oriented to person, place, and time.  Psychiatric:        Behavior: Behavior normal.     (all labs ordered are listed, but only abnormal results are displayed) Labs Reviewed  COMPREHENSIVE METABOLIC PANEL WITH GFR - Abnormal; Notable for the following components:      Result Value   Chloride 97 (*)    Glucose, Bld 145 (*)    Total Protein 8.7 (*)    Anion gap 16 (*)    All other components within normal limits  CBC - Abnormal; Notable for the following components:   WBC 12.6 (*)    Hemoglobin 10.2 (*)    HCT  32.9 (*)    MCV 70.3 (*)    MCH 21.8 (*)    RDW 19.6 (*)    Platelets 444 (*)    All other components within normal limits  LIPASE, BLOOD  HCG, SERUM, QUALITATIVE  URINALYSIS, ROUTINE W REFLEX MICROSCOPIC  PREGNANCY, URINE    EKG: None  Radiology: No results found.   Procedures   Medications Ordered in the ED  sodium chloride  0.9 % bolus 1,000 mL (0 mLs Intravenous Stopped 10/15/24 0035)  metoCLOPramide  (REGLAN ) injection 10 mg (10 mg Intravenous Given 10/14/24 2331)  diphenhydrAMINE  (BENADRYL ) injection 25 mg (25 mg Intravenous Given 10/14/24 2331)  droperidol  (INAPSINE ) 2.5 MG/ML injection 1.25 mg (1.25 mg Intravenous Given 10/15/24 0102)  diphenhydrAMINE  (BENADRYL ) injection 25 mg (25 mg Intravenous Given 10/15/24 0101)  sodium chloride  0.9 % bolus 1,000 mL (0 mLs Intravenous Stopped 10/15/24 0218)                                    Medical Decision Making Amount and/or Complexity of Data Reviewed Labs: ordered.  Risk Prescription drug management.   44 yo F with a chief complaints of intractable nausea and vomiting.  Going on for about 3 days now.  Says like this is a chronic issue for her and typically coincides with her menstrual cycle.  She has heavy menstrual cycles and has plans for a hysterectomy.  LFTs and lipase are unremarkable.  No significant electrolyte abnormalities.  No acute anemia.  Patient given fluids and antiemetics here with improvement of her symptoms.  Tolerating by mouth.  Will discharge home.  PCP follow-up.  3:03 AM:  I have discussed the diagnosis/risks/treatment options with the patient and family.  Evaluation and diagnostic testing in the emergency department does not suggest an emergent condition requiring admission or immediate intervention beyond what has been performed at this time.  They will follow up with PCP, GYN. We also discussed returning to the ED immediately if new or worsening sx occur. We discussed the sx which are most concerning (e.g.,  sudden worsening pain, fever, inability to tolerate by mouth) that necessitate immediate return. Medications administered to the patient during their visit and any new prescriptions provided to the patient are listed below.  Medications given during this visit Medications  sodium chloride  0.9 % bolus 1,000 mL (0 mLs Intravenous Stopped 10/15/24 0035)  metoCLOPramide  (REGLAN ) injection 10 mg (10 mg Intravenous Given 10/14/24 2331)  diphenhydrAMINE  (  BENADRYL ) injection 25 mg (25 mg Intravenous Given 10/14/24 2331)  droperidol  (INAPSINE ) 2.5 MG/ML injection 1.25 mg (1.25 mg Intravenous Given 10/15/24 0102)  diphenhydrAMINE  (BENADRYL ) injection 25 mg (25 mg Intravenous Given 10/15/24 0101)  sodium chloride  0.9 % bolus 1,000 mL (0 mLs Intravenous Stopped 10/15/24 0218)     The patient appears reasonably screen and/or stabilized for discharge and I doubt any other medical condition or other [facility] requiring further screening, evaluation, or treatment in the ED at this time prior to discharge.       Final diagnoses:  Nausea and vomiting in adult    ED Discharge Orders     None          Emil Share, DO 10/15/24 0303  "

## 2024-10-17 ENCOUNTER — Ambulatory Visit: Admitting: Family Medicine

## 2024-10-17 ENCOUNTER — Other Ambulatory Visit (HOSPITAL_COMMUNITY)
Admission: RE | Admit: 2024-10-17 | Discharge: 2024-10-17 | Disposition: A | Source: Ambulatory Visit | Attending: Family Medicine | Admitting: Family Medicine

## 2024-10-17 VITALS — BP 135/86 | HR 91 | Wt 251.0 lb

## 2024-10-17 DIAGNOSIS — N920 Excessive and frequent menstruation with regular cycle: Secondary | ICD-10-CM | POA: Insufficient documentation

## 2024-10-17 NOTE — Progress Notes (Signed)
" ° ° °  GYNECOLOGY OFFICE PROCEDURE NOTE  44 y.o. [id number] here for endometrial sampling prior to hysterectomy.  ENDOMETRIAL BIOPSY     The indications for endometrial biopsy were reviewed.   Risks of the biopsy including cramping, bleeding, infection, uterine perforation, inadequate specimen and need for additional procedures  were discussed. The patient states she understands and agrees to undergo procedure today. Consent was signed. Time out was performed. Chaperone was present during entire procedure. During the pelvic exam, the cervix was prepped with Betadine. A single-toothed tenaculum was placed on the anterior lip of the cervix to stabilize it. The 3 mm pipelle was introduced into the endometrial cavity without difficulty to a depth of 9 cm, and a moderate amount of tissue and blood was obtained and sent to pathology. The instruments were removed from the patient's vagina. Minimal bleeding from the cervix was noted. The patient tolerated the procedure well. Routine post-procedure instructions were given to the patient.     "

## 2024-10-17 NOTE — Progress Notes (Signed)
 Pt states she is bleeding some today, she is nervous about bx.  Pt unable to leave urine sample.

## 2024-10-21 ENCOUNTER — Ambulatory Visit
Admission: RE | Admit: 2024-10-21 | Discharge: 2024-10-21 | Disposition: A | Source: Ambulatory Visit | Attending: Family Medicine | Admitting: Family Medicine

## 2024-10-21 ENCOUNTER — Ambulatory Visit

## 2024-10-21 DIAGNOSIS — Z1231 Encounter for screening mammogram for malignant neoplasm of breast: Secondary | ICD-10-CM

## 2024-10-21 LAB — SURGICAL PATHOLOGY

## 2024-10-24 ENCOUNTER — Other Ambulatory Visit: Payer: Self-pay | Admitting: Family Medicine

## 2024-10-24 DIAGNOSIS — R928 Other abnormal and inconclusive findings on diagnostic imaging of breast: Secondary | ICD-10-CM

## 2024-10-27 ENCOUNTER — Encounter (HOSPITAL_COMMUNITY): Payer: Self-pay | Admitting: Family Medicine

## 2024-10-27 ENCOUNTER — Ambulatory Visit
Admission: RE | Admit: 2024-10-27 | Discharge: 2024-10-27 | Disposition: A | Source: Ambulatory Visit | Attending: Family Medicine | Admitting: Family Medicine

## 2024-10-27 ENCOUNTER — Other Ambulatory Visit: Payer: Self-pay

## 2024-10-27 ENCOUNTER — Other Ambulatory Visit: Payer: Self-pay | Admitting: Family Medicine

## 2024-10-27 DIAGNOSIS — R928 Other abnormal and inconclusive findings on diagnostic imaging of breast: Secondary | ICD-10-CM

## 2024-10-28 ENCOUNTER — Ambulatory Visit (HOSPITAL_BASED_OUTPATIENT_CLINIC_OR_DEPARTMENT_OTHER): Payer: Self-pay | Admitting: Anesthesiology

## 2024-10-28 ENCOUNTER — Telehealth: Payer: Self-pay | Admitting: Family Medicine

## 2024-10-28 ENCOUNTER — Ambulatory Visit (HOSPITAL_COMMUNITY)
Admission: RE | Admit: 2024-10-28 | Discharge: 2024-10-29 | Disposition: A | Attending: Family Medicine | Admitting: Family Medicine

## 2024-10-28 ENCOUNTER — Encounter (HOSPITAL_COMMUNITY): Payer: Self-pay | Admitting: Family Medicine

## 2024-10-28 ENCOUNTER — Encounter (HOSPITAL_COMMUNITY): Admission: RE | Disposition: A | Payer: Self-pay | Source: Home / Self Care | Attending: Family Medicine

## 2024-10-28 ENCOUNTER — Ambulatory Visit (HOSPITAL_COMMUNITY): Payer: Self-pay | Admitting: Anesthesiology

## 2024-10-28 ENCOUNTER — Other Ambulatory Visit: Payer: Self-pay

## 2024-10-28 DIAGNOSIS — D500 Iron deficiency anemia secondary to blood loss (chronic): Secondary | ICD-10-CM | POA: Diagnosis present

## 2024-10-28 DIAGNOSIS — Z01818 Encounter for other preprocedural examination: Secondary | ICD-10-CM

## 2024-10-28 DIAGNOSIS — Z87891 Personal history of nicotine dependence: Secondary | ICD-10-CM | POA: Insufficient documentation

## 2024-10-28 DIAGNOSIS — J45909 Unspecified asthma, uncomplicated: Secondary | ICD-10-CM

## 2024-10-28 DIAGNOSIS — D250 Submucous leiomyoma of uterus: Secondary | ICD-10-CM | POA: Diagnosis not present

## 2024-10-28 DIAGNOSIS — N871 Moderate cervical dysplasia: Secondary | ICD-10-CM | POA: Diagnosis present

## 2024-10-28 DIAGNOSIS — N939 Abnormal uterine and vaginal bleeding, unspecified: Secondary | ICD-10-CM | POA: Diagnosis not present

## 2024-10-28 DIAGNOSIS — E66813 Obesity, class 3: Secondary | ICD-10-CM | POA: Diagnosis not present

## 2024-10-28 DIAGNOSIS — K219 Gastro-esophageal reflux disease without esophagitis: Secondary | ICD-10-CM | POA: Insufficient documentation

## 2024-10-28 DIAGNOSIS — Z3046 Encounter for surveillance of implantable subdermal contraceptive: Secondary | ICD-10-CM

## 2024-10-28 DIAGNOSIS — N8000 Endometriosis of the uterus, unspecified: Secondary | ICD-10-CM

## 2024-10-28 DIAGNOSIS — N920 Excessive and frequent menstruation with regular cycle: Secondary | ICD-10-CM | POA: Diagnosis present

## 2024-10-28 DIAGNOSIS — R1115 Cyclical vomiting syndrome unrelated to migraine: Secondary | ICD-10-CM

## 2024-10-28 DIAGNOSIS — F418 Other specified anxiety disorders: Secondary | ICD-10-CM | POA: Diagnosis not present

## 2024-10-28 DIAGNOSIS — Z6841 Body Mass Index (BMI) 40.0 and over, adult: Secondary | ICD-10-CM | POA: Diagnosis not present

## 2024-10-28 DIAGNOSIS — N938 Other specified abnormal uterine and vaginal bleeding: Secondary | ICD-10-CM

## 2024-10-28 DIAGNOSIS — Z87410 Personal history of cervical dysplasia: Secondary | ICD-10-CM | POA: Insufficient documentation

## 2024-10-28 DIAGNOSIS — D259 Leiomyoma of uterus, unspecified: Secondary | ICD-10-CM | POA: Diagnosis not present

## 2024-10-28 DIAGNOSIS — Z79899 Other long term (current) drug therapy: Secondary | ICD-10-CM | POA: Insufficient documentation

## 2024-10-28 DIAGNOSIS — Z90710 Acquired absence of both cervix and uterus: Secondary | ICD-10-CM | POA: Diagnosis present

## 2024-10-28 HISTORY — PX: VAGINAL HYSTERECTOMY: SHX2639

## 2024-10-28 HISTORY — DX: Family history of other specified conditions: Z84.89

## 2024-10-28 LAB — PREPARE RBC (CROSSMATCH)

## 2024-10-28 LAB — CBC
HCT: 24.6 % — ABNORMAL LOW (ref 36.0–46.0)
Hemoglobin: 7.1 g/dL — ABNORMAL LOW (ref 12.0–15.0)
MCH: 20.5 pg — ABNORMAL LOW (ref 26.0–34.0)
MCHC: 28.9 g/dL — ABNORMAL LOW (ref 30.0–36.0)
MCV: 70.9 fL — ABNORMAL LOW (ref 80.0–100.0)
Platelets: 396 K/uL (ref 150–400)
RBC: 3.47 MIL/uL — ABNORMAL LOW (ref 3.87–5.11)
RDW: 18.4 % — ABNORMAL HIGH (ref 11.5–15.5)
WBC: 8.4 K/uL (ref 4.0–10.5)
nRBC: 0 % (ref 0.0–0.2)

## 2024-10-28 LAB — POCT PREGNANCY, URINE: Preg Test, Ur: NEGATIVE

## 2024-10-28 MED ORDER — HYDROMORPHONE HCL 1 MG/ML IJ SOLN
0.2000 mg | INTRAMUSCULAR | Status: DC | PRN
  Administered 2024-10-28: 0.4 mg via INTRAVENOUS
  Administered 2024-10-28: 0.6 mg via INTRAVENOUS
  Administered 2024-10-29: 0.4 mg via INTRAVENOUS
  Filled 2024-10-28 (×3): qty 1

## 2024-10-28 MED ORDER — MEPERIDINE HCL 25 MG/ML IJ SOLN: 6.2500 mg | INTRAMUSCULAR | Status: DC | PRN

## 2024-10-28 MED ORDER — SOD CITRATE-CITRIC ACID 500-334 MG/5ML PO SOLN: 30.0000 mL | ORAL | Status: DC

## 2024-10-28 MED ORDER — LIDOCAINE-EPINEPHRINE 1 %-1:100000 IJ SOLN
INTRAMUSCULAR | Status: AC
  Filled 2024-10-28: qty 1

## 2024-10-28 MED ORDER — TRANEXAMIC ACID-NACL 1000-0.7 MG/100ML-% IV SOLN
1000.0000 mg | Freq: Once | INTRAVENOUS | Status: AC
  Administered 2024-10-28: 1000 mg via INTRAVENOUS

## 2024-10-28 MED ORDER — KETAMINE HCL 50 MG/5ML IJ SOSY
PREFILLED_SYRINGE | INTRAMUSCULAR | Status: AC
  Filled 2024-10-28: qty 5

## 2024-10-28 MED ORDER — SCOPOLAMINE 1 MG/3DAYS TD PT72
MEDICATED_PATCH | TRANSDERMAL | Status: AC
  Filled 2024-10-28: qty 1

## 2024-10-28 MED ORDER — LIDOCAINE 2% (20 MG/ML) 5 ML SYRINGE
INTRAMUSCULAR | Status: AC
  Filled 2024-10-28: qty 5

## 2024-10-28 MED ORDER — DOCUSATE SODIUM 100 MG PO CAPS
100.0000 mg | ORAL_CAPSULE | Freq: Two times a day (BID) | ORAL | Status: DC
  Administered 2024-10-28 – 2024-10-29 (×2): 100 mg via ORAL
  Filled 2024-10-28 (×2): qty 1

## 2024-10-28 MED ORDER — METOCLOPRAMIDE HCL 5 MG/ML IJ SOLN
10.0000 mg | Freq: Four times a day (QID) | INTRAMUSCULAR | Status: DC
  Administered 2024-10-28 – 2024-10-29 (×3): 10 mg via INTRAVENOUS
  Filled 2024-10-28 (×3): qty 2

## 2024-10-28 MED ORDER — CEFAZOLIN SODIUM-DEXTROSE 2-4 GM/100ML-% IV SOLN
INTRAVENOUS | Status: AC
  Filled 2024-10-28: qty 100

## 2024-10-28 MED ORDER — FENTANYL CITRATE (PF) 100 MCG/2ML IJ SOLN
INTRAMUSCULAR | Status: AC
  Filled 2024-10-28: qty 2

## 2024-10-28 MED ORDER — AMISULPRIDE (ANTIEMETIC) 5 MG/2ML IV SOLN
INTRAVENOUS | Status: AC
  Filled 2024-10-28: qty 4

## 2024-10-28 MED ORDER — OXYCODONE HCL 5 MG PO TABS: 5.0000 mg | ORAL_TABLET | ORAL | Status: DC | PRN

## 2024-10-28 MED ORDER — MIDAZOLAM HCL (PF) 2 MG/2ML IJ SOLN
INTRAMUSCULAR | Status: DC | PRN
  Administered 2024-10-28: 2 mg via INTRAVENOUS

## 2024-10-28 MED ORDER — OXYCODONE HCL 5 MG/5ML PO SOLN: 5.0000 mg | Freq: Once | ORAL | Status: DC | PRN

## 2024-10-28 MED ORDER — DEXAMETHASONE SOD PHOSPHATE PF 10 MG/ML IJ SOLN
INTRAMUSCULAR | Status: AC
  Filled 2024-10-28: qty 1

## 2024-10-28 MED ORDER — SUGAMMADEX SODIUM 200 MG/2ML IV SOLN
INTRAVENOUS | Status: DC | PRN
  Administered 2024-10-28: 400 mg via INTRAVENOUS

## 2024-10-28 MED ORDER — ENOXAPARIN SODIUM 60 MG/0.6ML IJ SOSY
50.0000 mg | PREFILLED_SYRINGE | INTRAMUSCULAR | Status: DC
  Administered 2024-10-28: 50 mg via SUBCUTANEOUS
  Filled 2024-10-28: qty 0.6

## 2024-10-28 MED ORDER — KETOROLAC TROMETHAMINE 30 MG/ML IJ SOLN
30.0000 mg | Freq: Four times a day (QID) | INTRAMUSCULAR | Status: AC
  Administered 2024-10-28 – 2024-10-29 (×4): 30 mg via INTRAVENOUS
  Filled 2024-10-28 (×4): qty 1

## 2024-10-28 MED ORDER — CHLORHEXIDINE GLUCONATE 0.12 % MT SOLN
15.0000 mL | Freq: Once | OROMUCOSAL | Status: AC
  Administered 2024-10-28: 15 mL via OROMUCOSAL

## 2024-10-28 MED ORDER — LACTATED RINGERS IV SOLN: INTRAVENOUS | Status: DC

## 2024-10-28 MED ORDER — ORAL CARE MOUTH RINSE: 15.0000 mL | Freq: Once | OROMUCOSAL | Status: AC

## 2024-10-28 MED ORDER — OXYCODONE HCL 5 MG PO TABS: 5.0000 mg | ORAL_TABLET | Freq: Once | ORAL | Status: DC | PRN

## 2024-10-28 MED ORDER — CEFAZOLIN SODIUM-DEXTROSE 2-4 GM/100ML-% IV SOLN
2.0000 g | INTRAVENOUS | Status: AC
  Administered 2024-10-28: 2 g via INTRAVENOUS

## 2024-10-28 MED ORDER — HYDROMORPHONE HCL 1 MG/ML IJ SOLN
INTRAMUSCULAR | Status: DC | PRN
  Administered 2024-10-28: .5 mg via INTRAVENOUS

## 2024-10-28 MED ORDER — GLYCOPYRROLATE PF 0.2 MG/ML IJ SOSY
PREFILLED_SYRINGE | INTRAMUSCULAR | Status: AC
  Filled 2024-10-28: qty 1

## 2024-10-28 MED ORDER — MENTHOL 3 MG MT LOZG: 1.0000 | LOZENGE | OROMUCOSAL | Status: DC | PRN

## 2024-10-28 MED ORDER — FENTANYL CITRATE (PF) 250 MCG/5ML IJ SOLN: INTRAMUSCULAR | Status: DC | PRN

## 2024-10-28 MED ORDER — OXYCODONE HCL 5 MG PO TABS: 5.0000 mg | ORAL_TABLET | Freq: Four times a day (QID) | ORAL | 0 refills | Status: DC | PRN

## 2024-10-28 MED ORDER — HYDROMORPHONE HCL 1 MG/ML IJ SOLN
INTRAMUSCULAR | Status: AC
  Filled 2024-10-28: qty 0.5

## 2024-10-28 MED ORDER — TRANEXAMIC ACID-NACL 1000-0.7 MG/100ML-% IV SOLN
INTRAVENOUS | Status: AC
  Filled 2024-10-28: qty 100

## 2024-10-28 MED ORDER — DEXTROSE IN LACTATED RINGERS 5 % IV SOLN: INTRAVENOUS | Status: DC

## 2024-10-28 MED ORDER — POVIDONE-IODINE 10 % EX SWAB
2.0000 | Freq: Once | CUTANEOUS | Status: AC
  Administered 2024-10-28: 2 via TOPICAL

## 2024-10-28 MED ORDER — ACETAMINOPHEN 500 MG PO TABS
1000.0000 mg | ORAL_TABLET | ORAL | Status: AC
  Administered 2024-10-28: 1000 mg via ORAL

## 2024-10-28 MED ORDER — CHLORHEXIDINE GLUCONATE 0.12 % MT SOLN
OROMUCOSAL | Status: AC
  Filled 2024-10-28: qty 15

## 2024-10-28 MED ORDER — KETAMINE HCL 10 MG/ML IJ SOLN
INTRAMUSCULAR | Status: DC | PRN
  Administered 2024-10-28: 20 mg via INTRAVENOUS

## 2024-10-28 MED ORDER — LIDOCAINE 2% (20 MG/ML) 5 ML SYRINGE
INTRAMUSCULAR | Status: DC | PRN
  Administered 2024-10-28: 100 mg via INTRAVENOUS

## 2024-10-28 MED ORDER — GABAPENTIN 100 MG PO CAPS
100.0000 mg | ORAL_CAPSULE | Freq: Two times a day (BID) | ORAL | Status: DC
  Administered 2024-10-28 – 2024-10-29 (×2): 100 mg via ORAL
  Filled 2024-10-28 (×3): qty 1

## 2024-10-28 MED ORDER — METOPROLOL TARTRATE 5 MG/5ML IV SOLN
INTRAVENOUS | Status: DC | PRN
  Administered 2024-10-28: 2 mg via INTRAVENOUS

## 2024-10-28 MED ORDER — METOPROLOL TARTRATE 5 MG/5ML IV SOLN
INTRAVENOUS | Status: AC
  Filled 2024-10-28: qty 5

## 2024-10-28 MED ORDER — AMISULPRIDE (ANTIEMETIC) 5 MG/2ML IV SOLN
10.0000 mg | Freq: Once | INTRAVENOUS | Status: AC | PRN
  Administered 2024-10-28: 10 mg via INTRAVENOUS

## 2024-10-28 MED ORDER — LABETALOL HCL 5 MG/ML IV SOLN
10.0000 mg | Freq: Once | INTRAVENOUS | Status: AC
  Administered 2024-10-28: 10 mg via INTRAVENOUS

## 2024-10-28 MED ORDER — SODIUM CHLORIDE 0.9% IV SOLUTION: Freq: Once | INTRAVENOUS | Status: DC

## 2024-10-28 MED ORDER — MIDAZOLAM HCL 2 MG/2ML IJ SOLN
INTRAMUSCULAR | Status: AC
  Filled 2024-10-28: qty 2

## 2024-10-28 MED ORDER — FENTANYL CITRATE (PF) 100 MCG/2ML IJ SOLN
INTRAMUSCULAR | Status: DC | PRN
  Administered 2024-10-28 (×4): 50 ug via INTRAVENOUS

## 2024-10-28 MED ORDER — ALBUMIN HUMAN 5 % IV SOLN: INTRAVENOUS | Status: DC | PRN

## 2024-10-28 MED ORDER — PROPOFOL 10 MG/ML IV BOLUS
INTRAVENOUS | Status: AC
  Filled 2024-10-28: qty 20

## 2024-10-28 MED ORDER — SODIUM CHLORIDE 0.9 % IV SOLN: INTRAVENOUS | Status: DC | PRN

## 2024-10-28 MED ORDER — DEXAMETHASONE SOD PHOSPHATE PF 10 MG/ML IJ SOLN
INTRAMUSCULAR | Status: DC | PRN
  Administered 2024-10-28: 10 mg via INTRAVENOUS

## 2024-10-28 MED ORDER — 0.9 % SODIUM CHLORIDE (POUR BTL) OPTIME
TOPICAL | Status: DC | PRN
  Administered 2024-10-28: 100 mL

## 2024-10-28 MED ORDER — SCOPOLAMINE 1 MG/3DAYS TD PT72
MEDICATED_PATCH | TRANSDERMAL | Status: DC | PRN
  Administered 2024-10-28: 1 via TRANSDERMAL

## 2024-10-28 MED ORDER — IBUPROFEN 600 MG PO TABS
600.0000 mg | ORAL_TABLET | Freq: Four times a day (QID) | ORAL | Status: DC
  Administered 2024-10-29: 600 mg via ORAL
  Filled 2024-10-28: qty 1

## 2024-10-28 MED ORDER — ROCURONIUM BROMIDE 10 MG/ML (PF) SYRINGE
PREFILLED_SYRINGE | INTRAVENOUS | Status: DC | PRN
  Administered 2024-10-28: 100 mg via INTRAVENOUS

## 2024-10-28 MED ORDER — ONDANSETRON HCL 4 MG PO TABS: 4.0000 mg | ORAL_TABLET | Freq: Four times a day (QID) | ORAL | Status: DC | PRN

## 2024-10-28 MED ORDER — HYDROMORPHONE HCL 1 MG/ML IJ SOLN: 0.2500 mg | INTRAMUSCULAR | Status: DC | PRN

## 2024-10-28 MED ORDER — BISACODYL 10 MG RE SUPP: 10.0000 mg | Freq: Every day | RECTAL | Status: DC | PRN

## 2024-10-28 MED ORDER — ACETAMINOPHEN 500 MG PO TABS
1000.0000 mg | ORAL_TABLET | Freq: Four times a day (QID) | ORAL | Status: DC
  Administered 2024-10-28 – 2024-10-29 (×3): 1000 mg via ORAL
  Filled 2024-10-28 (×5): qty 2

## 2024-10-28 MED ORDER — LABETALOL HCL 5 MG/ML IV SOLN
INTRAVENOUS | Status: AC
  Filled 2024-10-28: qty 4

## 2024-10-28 MED ORDER — LIDOCAINE-EPINEPHRINE 1 %-1:100000 IJ SOLN
INTRAMUSCULAR | Status: DC | PRN
  Administered 2024-10-28: 20 mL

## 2024-10-28 MED ORDER — ESTRADIOL 0.01 % VA CREA
TOPICAL_CREAM | VAGINAL | Status: AC
  Filled 2024-10-28: qty 42.5

## 2024-10-28 MED ORDER — PROPOFOL 10 MG/ML IV BOLUS
INTRAVENOUS | Status: DC | PRN
  Administered 2024-10-28: 200 mg via INTRAVENOUS

## 2024-10-28 MED ORDER — BUPIVACAINE HCL (PF) 0.25 % IJ SOLN
INTRAMUSCULAR | Status: DC | PRN
  Administered 2024-10-28: 2 mL

## 2024-10-28 MED ORDER — ONDANSETRON HCL 4 MG/2ML IJ SOLN
4.0000 mg | Freq: Four times a day (QID) | INTRAMUSCULAR | Status: DC | PRN
  Administered 2024-10-28 (×2): 4 mg via INTRAVENOUS
  Filled 2024-10-28 (×2): qty 2

## 2024-10-28 MED ORDER — BUPIVACAINE HCL (PF) 0.25 % IJ SOLN
INTRAMUSCULAR | Status: AC
  Filled 2024-10-28: qty 10

## 2024-10-28 MED ORDER — ONDANSETRON HCL 4 MG/2ML IJ SOLN
INTRAMUSCULAR | Status: AC
  Filled 2024-10-28: qty 2

## 2024-10-28 MED ORDER — ACETAMINOPHEN 500 MG PO TABS
ORAL_TABLET | ORAL | Status: AC
  Filled 2024-10-28: qty 2

## 2024-10-28 MED ORDER — GLYCOPYRROLATE PF 0.2 MG/ML IJ SOSY
PREFILLED_SYRINGE | INTRAMUSCULAR | Status: DC | PRN
  Administered 2024-10-28: .1 mg via INTRAVENOUS

## 2024-10-28 NOTE — Interval H&P Note (Signed)
 History and Physical Interval Note:  10/28/2024 6:49 AM  Lynn Wilcox  has presented today for surgery, with the diagnosis of Menorrhagia.  The various methods of treatment have been discussed with the patient and family. After consideration of risks, benefits and other options for treatment, the patient has consented to  Procedures: HYSTERECTOMY, VAGINAL (N/A) possible bilateral salpingectomy, Nexplanon  removal as a surgical intervention.  The patient's history has been reviewed, patient examined, no change in status, stable for surgery.  I have reviewed the patient's chart and labs.  Questions were answered to the patient's satisfaction.     Glenys GORMAN Birk

## 2024-10-28 NOTE — H&P (Signed)
 Pt. With hgb of 7.1. She continues to have bleeding despite megace  and Nexplanon  in place. She is crossed for 2 units of blood, which is likely to be needed.

## 2024-10-28 NOTE — Anesthesia Postprocedure Evaluation (Signed)
"   Anesthesia Post Note  Patient: Lynn Wilcox  Procedure(s) Performed: HYSTERECTOMY, VAGINAL IMPLANT REMOVAL BILATERAL SALPINGECTOMY (Vagina )     Patient location during evaluation: PACU Anesthesia Type: General Level of consciousness: awake and alert Pain management: pain level controlled Vital Signs Assessment: post-procedure vital signs reviewed and stable Respiratory status: spontaneous breathing, nonlabored ventilation and respiratory function stable Cardiovascular status: blood pressure returned to baseline and stable Postop Assessment: no apparent nausea or vomiting Anesthetic complications: no   No notable events documented.  Last Vitals:  Vitals:   10/28/24 1000 10/28/24 1015  BP: (!) 168/94 (!) 155/93  Pulse: (!) 109 93  Resp: (!) 23 (!) 23  Temp:  36.4 C  SpO2: 95% 94%    Last Pain:  Vitals:   10/28/24 1000  TempSrc:   PainSc: Asleep                 Butler Levander Pinal      "

## 2024-10-28 NOTE — Anesthesia Procedure Notes (Signed)
 Procedure Name: Intubation Date/Time: 10/28/2024 7:40 AM  Performed by: Dianne Burnard RAMAN, CRNAPre-anesthesia Checklist: Patient identified, Patient being monitored, Timeout performed, Emergency Drugs available and Suction available Patient Re-evaluated:Patient Re-evaluated prior to induction Oxygen Delivery Method: Circle system utilized Preoxygenation: Pre-oxygenation with 100% oxygen Induction Type: IV induction Ventilation: Mask ventilation without difficulty Laryngoscope Size: Mac and 3 Grade View: Grade I Tube type: Oral Tube size: 7.0 mm Number of attempts: 1 Airway Equipment and Method: Video-laryngoscopy Placement Confirmation: ETT inserted through vocal cords under direct vision, positive ETCO2 and breath sounds checked- equal and bilateral Secured at: 22 cm Tube secured with: Tape Dental Injury: Teeth and Oropharynx as per pre-operative assessment

## 2024-10-28 NOTE — Transfer of Care (Signed)
 Immediate Anesthesia Transfer of Care Note  Patient: Lynn Wilcox  Procedure(s) Performed: HYSTERECTOMY, VAGINAL IMPLANT REMOVAL BILATERAL SALPINGECTOMY (Vagina )  Patient Location: PACU  Anesthesia Type:General  Level of Consciousness: awake, oriented, and patient cooperative  Airway & Oxygen Therapy: Patient Spontanous Breathing  Post-op Assessment: Report given to RN, Post -op Vital signs reviewed and stable, and Patient moving all extremities X 4  Post vital signs: Reviewed and stable  Last Vitals:  Vitals Value Taken Time  BP 153/104 10/28/24 09:05  Temp    Pulse 104 10/28/24 09:10  Resp 19 10/28/24 09:10  SpO2 93 % 10/28/24 09:10  Vitals shown include unfiled device data.  Last Pain:  Vitals:   10/28/24 0623  TempSrc: Oral  PainSc: 0-No pain      Patients Stated Pain Goal: 5 (10/28/24 9376)  Complications: No notable events documented.

## 2024-10-28 NOTE — Anesthesia Preprocedure Evaluation (Signed)
"                                    Anesthesia Evaluation  Patient identified by MRN, date of birth, ID band Patient awake    Reviewed: Allergy & Precautions, H&P , NPO status , Patient's Chart, lab work & pertinent test results  Airway Mallampati: II  TM Distance: >3 FB Neck ROM: Full    Dental  (+) Dental Advisory Given   Pulmonary asthma , former smoker   Pulmonary exam normal breath sounds clear to auscultation       Cardiovascular negative cardio ROS Normal cardiovascular exam Rhythm:Regular Rate:Normal     Neuro/Psych  Headaches  Anxiety Depression     negative psych ROS   GI/Hepatic Neg liver ROS,GERD  ,,  Endo/Other    Class 3 obesity  Renal/GU negative Renal ROS  negative genitourinary   Musculoskeletal  (+) Arthritis , Osteoarthritis,    Abdominal  (+) + obese  Peds negative pediatric ROS (+)  Hematology  (+) Blood dyscrasia, anemia   Anesthesia Other Findings   Reproductive/Obstetrics negative OB ROS                              Anesthesia Physical Anesthesia Plan  ASA: 3  Anesthesia Plan: General   Post-op Pain Management: Tylenol  PO (pre-op)*   Induction: Intravenous  PONV Risk Score and Plan: 3 and Ondansetron , Dexamethasone , Midazolam  and Treatment may vary due to age or medical condition  Airway Management Planned: Oral ETT  Additional Equipment:   Intra-op Plan:   Post-operative Plan: Extubation in OR  Informed Consent: I have reviewed the patients History and Physical, chart, labs and discussed the procedure including the risks, benefits and alternatives for the proposed anesthesia with the patient or authorized representative who has indicated his/her understanding and acceptance.     Dental advisory given  Plan Discussed with: CRNA  Anesthesia Plan Comments:         Anesthesia Quick Evaluation  "

## 2024-10-28 NOTE — Op Note (Signed)
 Preoperative diagnosis: Abnormal uterine bleeding, anemia, CIN 2  Postoperative diagnosis: Same  Procedure: Transvaginal hysterectomy, bilateral salpingectomy, Nexplanon  removal   Surgeon: Glenys RAMAN. Fredirick, M.D.  Assistant: Lynwood Solomons, MD An experienced assistant was required given the standard of surgical care given the complexity of the case.  This assistant was needed for exposure, dissection, suctioning, retraction, instrument exchange, and for overall help during the procedure.  Anesthesia: General ETT-,Miller, Butler Dade, MD, MD  Findings: enlarged uterus, normal tubes, ovaries.  Estimated blood loss: 150 cc  Specimen: Uterus, tubes to pathology  Reason for procedure: Patient had long h/o bleeding. She has been on Nexplanon  and Megace  and continued to bleed. Has had significant anemia.  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 and desired definitive treament.   Procedure: Patient was taken to the OR where she was placed in dorsal lithotomy in Allen stirrups. She was prepped and draped in the usual sterile fashion. A timeout was performed. The patient received 2 g of Ancef  and TXA prior to procedure. The patient had SCDs in place.  A weighted speculum was placed inside the vagina. The cervix was visualized and grasped with 2 doublle-tooth tenacula. 20 cc of 1% lidocaine  with epinephrine  were injected paracervically. A knife was used to make a circumferential incision around the vagina. An opened sponge was used to dissect the vagina off the cervix. The posterior peritoneum was entered sharply with Mayo scissors. The posterior peritoneum was tagged to the vaginal cuff with a single stitch. The anterior peritoneal cavity was not easily entered and dissected superiorly. A Heaney clamp was used to clamp first the left uterosacral ligament and cardinal which was then cut and Haney suture ligated with 0 Vicryl stitch, the stitch was held. Similarly the right uterosacral ligament  was clamped cut and suture ligated.  Sequential bites up the broad to the uterine arteries were taken until the tubo-ovarian pedicles were encountered. The left utero-ovarian pedicle grasped with a Heaney clamp. The right utero-ovarian pedicle was similarly grasped with the Heaney clamp. The uterus removed. Free tie and Heaney suture placed bilaterally. The right left ovaries were easily visualized. The left tube was grasped with Babcock clamp and a Kelly clamp placed behind this. Tube  removed and ligated with a free tie.The right tube was similarly grasped with a Babcock clamp and a Kelly clamp used to clamp behind this. The tube removed and ligated with a free tie. Inspection of all pedicles revealed adequate hemostasis. There was no bleeding. The vagina was closed with 0 Vicryl suture in a locked running fashion with care taken to incorporate the uterosacral pedicles. Excellent hemostasis was noted at the end of the case. The vaginal cuff was inspected there was minimal bleeding noted.  A Foley catheter is placed inside her bladder. Clear, yellow urine was noted. All instrument needle and lap counts were correct x 2. Patient was awakened taken to recovery room in stable condition.  Attention was turned to the left arm. Nexplanon  site identified.  Area prepped in usual sterile fashon. One cc of 2% lidocaine  was used to anesthetize the area at the distal end of the implant. A small stab incision was made right beside the implant on the distal portion.  The Nexplanon  rod was grasped using hemostats and removed without difficulty.  There was less than 3 cc blood loss. There were no complications.  Steri-strips were applied over the small incision.  A pressure bandage was applied to reduce any bruising.  The patient tolerated the procedure well and was given post procedure instructions.  Glenys GORMAN Birk, MD 10/28/2024, 8:56 AM

## 2024-10-29 ENCOUNTER — Other Ambulatory Visit (HOSPITAL_COMMUNITY): Payer: Self-pay

## 2024-10-29 ENCOUNTER — Encounter (HOSPITAL_COMMUNITY): Payer: Self-pay | Admitting: Family Medicine

## 2024-10-29 DIAGNOSIS — N920 Excessive and frequent menstruation with regular cycle: Secondary | ICD-10-CM | POA: Diagnosis not present

## 2024-10-29 LAB — COMPREHENSIVE METABOLIC PANEL WITH GFR
ALT: 8 U/L (ref 0–44)
AST: 12 U/L — ABNORMAL LOW (ref 15–41)
Albumin: 3.5 g/dL (ref 3.5–5.0)
Alkaline Phosphatase: 77 U/L (ref 38–126)
Anion gap: 11 (ref 5–15)
BUN: 5 mg/dL — ABNORMAL LOW (ref 6–20)
CO2: 24 mmol/L (ref 22–32)
Calcium: 8.9 mg/dL (ref 8.9–10.3)
Chloride: 102 mmol/L (ref 98–111)
Creatinine, Ser: 0.74 mg/dL (ref 0.44–1.00)
GFR, Estimated: >60 mL/min
Glucose, Bld: 143 mg/dL — ABNORMAL HIGH (ref 70–99)
Potassium: 3.5 mmol/L (ref 3.5–5.1)
Sodium: 137 mmol/L (ref 135–145)
Total Bilirubin: 0.4 mg/dL (ref 0.0–1.2)
Total Protein: 6.4 g/dL — ABNORMAL LOW (ref 6.5–8.1)

## 2024-10-29 LAB — CBC WITH DIFFERENTIAL/PLATELET
Abs Immature Granulocytes: 0.04 K/uL (ref 0.00–0.07)
Basophils Absolute: 0 K/uL (ref 0.0–0.1)
Basophils Relative: 0 %
Eosinophils Absolute: 0 K/uL (ref 0.0–0.5)
Eosinophils Relative: 0 %
HCT: 21.4 % — ABNORMAL LOW (ref 36.0–46.0)
Hemoglobin: 6.6 g/dL — CL (ref 12.0–15.0)
Immature Granulocytes: 0 %
Lymphocytes Relative: 17 %
Lymphs Abs: 1.9 K/uL (ref 0.7–4.0)
MCH: 21.1 pg — ABNORMAL LOW (ref 26.0–34.0)
MCHC: 30.8 g/dL (ref 30.0–36.0)
MCV: 68.4 fL — ABNORMAL LOW (ref 80.0–100.0)
Monocytes Absolute: 0.9 K/uL (ref 0.1–1.0)
Monocytes Relative: 8 %
Neutro Abs: 8.2 K/uL — ABNORMAL HIGH (ref 1.7–7.7)
Neutrophils Relative %: 75 %
Platelets: 356 K/uL (ref 150–400)
RBC: 3.13 MIL/uL — ABNORMAL LOW (ref 3.87–5.11)
RDW: 18.2 % — ABNORMAL HIGH (ref 11.5–15.5)
WBC: 11 K/uL — ABNORMAL HIGH (ref 4.0–10.5)
nRBC: 0.3 % — ABNORMAL HIGH (ref 0.0–0.2)

## 2024-10-29 LAB — SURGICAL PATHOLOGY

## 2024-10-29 MED ORDER — HYDROMORPHONE HCL 2 MG PO TABS
2.0000 mg | ORAL_TABLET | ORAL | 0 refills | Status: AC | PRN
  Filled 2024-10-29: qty 22, 4d supply, fill #0

## 2024-10-29 MED ORDER — OXYCODONE HCL 5 MG PO TABS
5.0000 mg | ORAL_TABLET | Freq: Four times a day (QID) | ORAL | 0 refills | Status: DC | PRN
  Filled 2024-10-29: qty 20, 5d supply, fill #0

## 2024-10-29 MED ORDER — LACTATED RINGERS IV SOLN: INTRAVENOUS | Status: DC

## 2024-10-29 MED ORDER — HYDROMORPHONE HCL 2 MG PO TABS
2.0000 mg | ORAL_TABLET | ORAL | Status: DC | PRN
  Administered 2024-10-29: 2 mg via ORAL
  Filled 2024-10-29: qty 1

## 2024-10-29 MED ORDER — IRON SUCROSE 200 MG IVPB - SIMPLE MED
200.0000 mg | Freq: Once | Status: AC
  Administered 2024-10-29: 200 mg via INTRAVENOUS
  Filled 2024-10-29: qty 200

## 2024-10-29 MED ORDER — HYDROMORPHONE HCL 2 MG PO TABS: 2.0000 mg | ORAL_TABLET | ORAL | 0 refills | Status: DC | PRN

## 2024-10-29 MED ORDER — LACTATED RINGERS IV BOLUS
500.0000 mL | Freq: Once | INTRAVENOUS | Status: AC
  Administered 2024-10-29: 500 mL via INTRAVENOUS

## 2024-10-29 NOTE — Plan of Care (Signed)
Patient to be discharged with printed instructions. Bula Cavalieri L Osmar Howton, RN  

## 2024-10-29 NOTE — Progress Notes (Signed)
 1 Day Post-Op Procedures (LRB): HYSTERECTOMY, VAGINAL IMPLANT REMOVAL BILATERAL SALPINGECTOMY (N/A)  Subjective: Patient reports intense N/V yesterday, unclear if related to anesthesia, had some post-tussive emesis or the oxycodone , which makes her nauseated. Able to tolerate breakfast. Her foley remained in until this am. Has not voided. Has not attempted ambulation.    Objective: I have reviewed patient's vital signs, intake and output, medications, and labs.  General: alert, cooperative, and appears stated age Resp: normal effort Cardio: regular rate and rhythm GI: soft, appropriately tender Extremities: Homans sign is negative, no sign of DVT Vaginal Bleeding: minimal  Assessment: s/p Procedures: HYSTERECTOMY, VAGINAL IMPLANT REMOVAL BILATERAL SALPINGECTOMY (N/A): stable  Plan: Advance diet Encourage ambulation Advance to PO medication Discontinue IV fluids Discharge home If meets milestones, to include ambulation and voiding Will give IV iron  prior to DC  LOS: 0 days    Glenys GORMAN Birk, MD 10/29/2024, 10:41 AM

## 2024-10-29 NOTE — Discharge Summary (Signed)
 Physician Discharge Summary  Patient ID: Lynn Wilcox MRN: [id number] DOB/AGE: 1981/06/07 44 y.o.  Admit date: 10/28/2024 Discharge date: No discharge date for patient encounter.    Admission Diagnoses:  Principal Problem:   Status post hysterectomy Active Problems:   Menorrhagia with regular cycle   Anemia due to blood loss   Dysplasia of cervix, high grade CIN 2   Nexplanon  removal   Discharge Diagnoses:  Same  Past Medical History:  Diagnosis Date   Anxiety and depression 1995   Arthritis 2014   Asthma 1993   Uses inhaler prn   Diverticulosis    Esophagitis    Family history of adverse reaction to anesthesia    son had reaction to anesthesia- difficult to wake up from dental anesthesia and had to be transported to hospital   H/O gastroesophageal reflux (GERD) 2013   Hiatal hernia    Hypokalemia    Migraines    Obesity    Seasonal allergies     Surgeries: Procedures: HYSTERECTOMY, VAGINAL IMPLANT REMOVAL BILATERAL SALPINGECTOMY on 10/28/2024   Consultants:   Discharged Condition: Improved  Hospital Course: Lynn Wilcox is an 44 y.o. female G2P2002 who was admitted 10/28/2024 with a chief complaint of No chief complaint on file. , and found to have a diagnosis of Status post hysterectomy.  They were brought to the operating room on 10/28/2024 and underwent the above named procedures.    She was given perioperative antibiotics:  Anti-infectives (From admission, onward)    Start     Dose/Rate Route Frequency Ordered Stop   10/28/24 0600  ceFAZolin  (ANCEF ) IVPB 2g/100 mL premix        2 g 200 mL/hr over 30 Minutes Intravenous On call to O.R. 10/28/24 0540 10/28/24 0751   10/28/24 0552  ceFAZolin  (ANCEF ) 2-4 GM/100ML-% IVPB       Note to Pharmacy: Dalton Azure, Cat: cabinet override      10/28/24 0552 10/28/24 0825     .   She was given sequential compression devices, early ambulation, and chemoprophylaxis for DVT prophylaxis.  She benefited maximally  from their hospital stay and there were no complications. She was ambulating, voiding, tolerating po and deemed stable for discharge. She was given IV iron  due to low hemoglobin, present on admission.  Recent vital signs:  Vitals:   10/29/24 0826 10/29/24 1408  BP: 128/69 136/71  Pulse: 91 91  Resp: 17 18  Temp: 98.6 F (37 C) 98.2 F (36.8 C)  SpO2:  100%   Discharge exam: Physical Examination: General appearance - alert, well appearing, and in no distress Chest - normal effort Heart - normal rate and regular rhythm Abdomen - soft, appropriately tender, dressing is clean and dry Extremities - peripheral pulses normal, no pedal edema, no clubbing or cyanosis, Homan's sign negative bilaterally Recent laboratory studies:  Results for orders placed or performed during the hospital encounter of 10/28/24  Pregnancy, urine POC   Collection Time: 10/28/24  5:53 AM  Result Value Ref Range   Preg Test, Ur NEGATIVE NEGATIVE  CBC   Collection Time: 10/28/24  6:49 AM  Result Value Ref Range   WBC 8.4 4.0 - 10.5 K/uL   RBC 3.47 (L) 3.87 - 5.11 MIL/uL   Hemoglobin 7.1 (L) 12.0 - 15.0 g/dL   HCT 75.3 (L) 63.9 - 53.9 %   MCV 70.9 (L) 80.0 - 100.0 fL   MCH 20.5 (L) 26.0 - 34.0 pg   MCHC 28.9 (L) 30.0 - 36.0 g/dL  RDW 18.4 (H) 11.5 - 15.5 %   Platelets 396 150 - 400 K/uL   nRBC 0.0 0.0 - 0.2 %  Type and screen   Collection Time: 10/28/24  6:49 AM  Result Value Ref Range   ABO/RH(D) B POS    Antibody Screen NEG    Sample Expiration 10/31/2024,2359    Unit Number [id number]    Blood Component Type RED CELLS,LR    Unit division 00    Status of Unit ALLOCATED    Transfusion Status OK TO TRANSFUSE    Crossmatch Result      Compatible Performed at [facility], [address], [place], [place] [address]    Unit Number [id number]    Blood Component Type RED CELLS,LR    Unit division 00    Status of Unit ALLOCATED    Transfusion Status OK TO TRANSFUSE    Crossmatch Result  Compatible   BPAM RBC   Collection Time: 10/28/24  6:49 AM  Result Value Ref Range   ISSUE DATE / TIME 797398799155    Blood Product Unit Number [id number]    PRODUCT CODE [id number]    Unit Type and Rh 7300    Blood Product Expiration Date 797397887640    ISSUE DATE / TIME 797398799155    Blood Product Unit Number [id number]    PRODUCT CODE [id number]    Unit Type and Rh 7300    Blood Product Expiration Date 797397867640   Prepare RBC (crossmatch)   Collection Time: 10/28/24  7:32 AM  Result Value Ref Range   Order Confirmation      ORDER PROCESSED BY BLOOD BANK Performed at [facility], [address] [place], [place] [address]   Prepare RBC (crossmatch)   Collection Time: 10/28/24  8:09 AM  Result Value Ref Range   Order Confirmation      ORDER PROCESSED BY BLOOD BANK DUPLICATE REQUEST Performed at [facility], [address] [place], [place] [address]   Surgical pathology   Collection Time: 10/28/24  8:45 AM  Result Value Ref Range   SURGICAL PATHOLOGY      SURGICAL PATHOLOGY CASE: [id number] PATIENT: Lynn Wilcox Surgical Pathology Report     Clinical History: Menorrhagia (TP)     FINAL MICROSCOPIC DIAGNOSIS:  A. UTERUS AND CERVIX WITH BILATERAL FALLOPIAN TUBES, HYSTERECTOMY: Cervix     Squamous metaplasia.     Negative for dysplasia. Endometrium     Inactive endometrial glands with stromal progestational changes.     Negative for endometrial intraepithelial neoplasia (EIN) and malignancy. Myometrium     Leiomyomata.     Negative for malignancy. Uterine serosa     Endometriosis.     Negative for malignancy. Bilateral fallopian tubes     Two unremarkable fallopian tubes, one without attached fimbria.     Negative for endometriosis and malignancy.   GROSS DESCRIPTION: A.  Specimen: received fresh and subsequently placed in formalin labeled with the patient's name and Uterus, cervix. Integrity: intact Weight: 192 g Uterus  Size and Shape: .6.1 x 5.5 x 5.5 cm Serosa: red and glistening with minimal  brown mottling. Cervix: 4.4 cm long and 4.3 cm in diameter with red, rubbery ectocervical mucosa surrounding a 2.2 cm patent, slit-like external os. Endometrium: red-tan, glistening endometrium lines the 5.9 cm long, 3.8 cm wide endometrial cavity. A 3.6 cm submucosal nodule bulges into the cavity. Myometrium: in addition to the submucosal nodule, a few additional well-circumscribed, white-tan nodules ranging from 0.3-0.9  cm in greatest dimension are present in the remaining 3.3-4.8 cm thick myometrium. Detached bilateral fallopian tubes: 4.3 x 0.3 cm and 3.8 x 0.3 cm; red-tan with unremarkable cut and serosal surfaces. Fimbria is only distinct in the longer segment. Block Summary: A1: brown mottled serosa A2: anterior and posterior cervix A3: anterior uterus A4: posterior uterus A5: smaller nodules A6: submucosal nodule A7: first FT fimbria and representative cross sections A8: second FT possible fimbria and representative cross sections (LEF 10/28/2024)  Fina l Diagnosis performed by Norleen Dover, MD.   Electronically signed 10/29/2024 Technical and / or Professional components performed at [facility]. [facility], [address], [place], [place] [address].  Immunohistochemistry Technical component (if applicable) was performed at [facility]. [address], [address], [place], [place] [address].   IMMUNOHISTOCHEMISTRY DISCLAIMER (if applicable): Some of these immunohistochemical stains may have been developed and the performance characteristics determine by [facility]. Some may not have been cleared or approved by the U.S. Food and Drug Administration. The FDA has determined that such clearance or approval is not necessary. This test is used for clinical purposes. It should not be regarded as investigational or for research. This laboratory is certified under the  Clinical Laboratory Improvement Amendments of 1988 (CLIA-88) as qualified to perform high complexity clinical laboratory testi ng.  The controls stained appropriately.   IHC stains are performed on formalin fixed, paraffin embedded tissue using a 3,3diaminobenzidine (DAB) chromogen and Leica Bond Autostainer System. The staining intensity of the nucleus is score manually and is reported as the percentage of tumor cell nuclei demonstrating specific nuclear staining. The specimens are fixed in 10% Neutral Formalin for at least 6 hours and up to 72hrs. These tests are validated on decalcified tissue. Results should be interpreted with caution given the possibility of false negative results on decalcified specimens. Antibody Clones are as follows ER-clone 51F, PR-clone 16, Ki67- clone MM1. Some of these immunohistochemical stains may have been developed and the performance characteristics determined by [facility].   CBC with Differential/Platelet   Collection Time: 10/29/24  4:40 AM  Result Value Ref Range   WBC 11.0 (H) 4.0 - 10.5 K/uL   RBC 3.13 (L) 3.87 - 5.11 MIL/uL   Hemoglobin 6.6 (LL) 12.0 - 15.0 g/dL   HCT 78.5 (L) 63.9 - 53.9 %   MCV 68.4 (L) 80.0 - 100.0 fL   MCH 21.1 (L) 26.0 - 34.0 pg   MCHC 30.8 30.0 - 36.0 g/dL   RDW 81.7 (H) 88.4 - 84.4 %   Platelets 356 150 - 400 K/uL   nRBC 0.3 (H) 0.0 - 0.2 %   Neutrophils Relative % 75 %   Neutro Abs 8.2 (H) 1.7 - 7.7 K/uL   Lymphocytes Relative 17 %   Lymphs Abs 1.9 0.7 - 4.0 K/uL   Monocytes Relative 8 %   Monocytes Absolute 0.9 0.1 - 1.0 K/uL   Eosinophils Relative 0 %   Eosinophils Absolute 0.0 0.0 - 0.5 K/uL   Basophils Relative 0 %   Basophils Absolute 0.0 0.0 - 0.1 K/uL   Immature Granulocytes 0 %   Abs Immature Granulocytes 0.04 0.00 - 0.07 K/uL  Comprehensive metabolic panel   Collection Time: 10/29/24  4:40 AM  Result Value Ref Range   Sodium 137 135 - 145 mmol/L   Potassium 3.5 3.5 - 5.1 mmol/L    Chloride 102 98 - 111 mmol/L   CO2 24 22 - 32 mmol/L  Glucose, Bld 143 (H) 70 - 99 mg/dL   BUN 5 (L) 6 - 20 mg/dL   Creatinine, Ser 9.25 0.44 - 1.00 mg/dL   Calcium 8.9 8.9 - 89.6 mg/dL   Total Protein 6.4 (L) 6.5 - 8.1 g/dL   Albumin  3.5 3.5 - 5.0 g/dL   AST 12 (L) 15 - 41 U/L   ALT 8 0 - 44 U/L   Alkaline Phosphatase 77 38 - 126 U/L   Total Bilirubin 0.4 0.0 - 1.2 mg/dL   GFR, Estimated >39 >39 mL/min   Anion gap 11 5 - 15    Discharge Medications:   Allergies as of 10/29/2024       Reactions   Coffee Bean Extract [coffea Arabica] Other (See Comments)   Throat itches   Tea Other (See Comments)   Throat itches   Yeast-derived Drug Products Other (See Comments)   Throat itches   Chocolate Other (See Comments)   Throat itches        Medication List     STOP taking these medications    megestrol  40 MG tablet Commonly known as: MEGACE        TAKE these medications    albuterol  108 (90 Base) MCG/ACT inhaler Commonly known as: VENTOLIN  HFA Inhale 2 puffs into the lungs as needed for wheezing or shortness of breath.   cetirizine  10 MG tablet Commonly known as: ZYRTEC  TAKE 1 TABLET BY MOUTH DAILY   ferrous sulfate  324 (65 Fe) MG Tbec Take 1 tablet (325 mg total) by mouth every other day.   fluticasone  50 MCG/ACT nasal spray Commonly known as: FLONASE  Place 2 sprays into both nostrils daily.   HYDROmorphone  2 MG tablet Commonly known as: DILAUDID  Take 1 tablet (2 mg total) by mouth every 4 (four) hours as needed for severe pain (pain score 7-10).   ondansetron  4 MG disintegrating tablet Commonly known as: ZOFRAN -ODT Take 1 tablet (4 mg total) by mouth every 8 (eight) hours as needed for nausea or vomiting.   pantoprazole  40 MG tablet Commonly known as: PROTONIX  Take 1 tablet (40 mg total) by mouth daily.   scopolamine  1 MG/3DAYS Commonly known as: TRANSDERM-SCOP Place 1 patch (1 mg total) onto the skin every 3 (three) days.        Diagnostic  Studies: MM 3D DIAGNOSTIC MAMMOGRAM UNILATERAL LEFT BREAST Result Date: 10/27/2024 CLINICAL DATA:  Patient was recalled from baseline screening mammogram for 2 possible masses in the left breast. EXAM: DIGITAL DIAGNOSTIC UNILATERAL LEFT MAMMOGRAM WITH TOMOSYNTHESIS AND CAD; ULTRASOUND LEFT BREAST LIMITED TECHNIQUE: Left digital diagnostic mammography and breast tomosynthesis was performed. The images were evaluated with computer-aided detection. ; Targeted ultrasound examination of the left breast was performed. COMPARISON:  Baseline screening mammogram dated 10/21/2024. ACR Breast Density Category b: There are scattered areas of fibroglandular density. FINDINGS: Additional imaging of the left breast was performed. There is persistence of an oval well-circumscribed 1 cm mass in the 3 o'clock region of the left breast, middle depth. There is persistence of an oval well-circumscribed 1 cm mass in the upper-outer quadrant of the left breast, anterior depth. There are no malignant type microcalcifications. Targeted ultrasound is performed, showing an oval, well-circumscribed hypoechoic mass in the left breast at 1 o'clock 10 cm from the nipple. The mass has no internal vascularity. It measures 1.0 x 0.6 x 1.0 cm. There is an oval well-circumscribed hypoechoic mass in the left breast at 3 o'clock 12 cm from the nipple with central fat measuring 1.0 x  0.5 x 0.6 cm. It appears to have internal fat is likely an benign intramammary lymph node. IMPRESSION: 1. 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 2. Probable benign 1 cm intramammary lymph node in the left breast at 3 o'clock 12 cm from the nipple. RECOMMENDATION: Short-term interval follow-up left breast ultrasound in 6 months is recommended. I have discussed the findings and recommendations with the patient. If applicable, a reminder letter will be sent to the patient regarding the next appointment. BI-RADS CATEGORY  3:  Probably benign. Electronically Signed   By: Dina  Arceo M.D.   On: 10/27/2024 08:46   US  LIMITED ULTRASOUND INCLUDING AXILLA LEFT BREAST  Result Date: 10/27/2024 CLINICAL DATA:  Patient was recalled from baseline screening mammogram for 2 possible masses in the left breast. EXAM: DIGITAL DIAGNOSTIC UNILATERAL LEFT MAMMOGRAM WITH TOMOSYNTHESIS AND CAD; ULTRASOUND LEFT BREAST LIMITED TECHNIQUE: Left digital diagnostic mammography and breast tomosynthesis was performed. The images were evaluated with computer-aided detection. ; Targeted ultrasound examination of the left breast was performed. COMPARISON:  Baseline screening mammogram dated 10/21/2024. ACR Breast Density Category b: There are scattered areas of fibroglandular density. FINDINGS: Additional imaging of the left breast was performed. There is persistence of an oval well-circumscribed 1 cm mass in the 3 o'clock region of the left breast, middle depth. There is persistence of an oval well-circumscribed 1 cm mass in the upper-outer quadrant of the left breast, anterior depth. There are no malignant type microcalcifications. Targeted ultrasound is performed, showing an oval, well-circumscribed hypoechoic mass in the left breast at 1 o'clock 10 cm from the nipple. The mass has no internal vascularity. It measures 1.0 x 0.6 x 1.0 cm. There is an oval well-circumscribed hypoechoic mass in the left breast at 3 o'clock 12 cm from the nipple with central fat measuring 1.0 x 0.5 x 0.6 cm. It appears to have internal fat is likely an benign intramammary lymph node. IMPRESSION: 1. 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 2. Probable benign 1 cm intramammary lymph node in the left breast at 3 o'clock 12 cm from the nipple. RECOMMENDATION: Short-term interval follow-up left breast ultrasound in 6 months is recommended. I have discussed the findings and recommendations with the patient. If applicable, a reminder letter  will be sent to the patient regarding the next appointment. BI-RADS CATEGORY  3: Probably benign. Electronically Signed   By: Dina  Arceo M.D.   On: 10/27/2024 08:46   MM 3D SCREENING MAMMOGRAM BILATERAL BREAST Addendum Date: 10/27/2024 ADDENDUM REPORT: 10/27/2024 07:53 ADDENDUM: This is an addendum to a screening mammogram dated 10/21/2024. The report should state in the left breast, possible masses warrant further evaluation. In the right breast, no findings suspicious for malignancy. IMPRESSION: Further evaluation is suggested for possible masses in the left breast. Recommendation: Diagnostic mammogram and possible ultrasound of the left breast. BI-RADS category 0: Incomplete. Need additional imaging evaluation and/or prior mammograms for comparison. Electronically Signed   By: Dina  Arceo M.D.   On: 10/27/2024 07:53   Result Date: 10/27/2024 CLINICAL DATA:  Screening. EXAM: DIGITAL SCREENING BILATERAL MAMMOGRAM WITH TOMOSYNTHESIS AND CAD TECHNIQUE: Bilateral screening digital craniocaudal and mediolateral oblique mammograms were obtained. Bilateral screening digital breast tomosynthesis was performed. The images were evaluated with computer-aided detection. COMPARISON:  None available. ACR Breast Density Category b: There are scattered areas of fibroglandular density. FINDINGS: In the right breast, possible masses warrants  further evaluation. In the left breast, no findings suspicious for malignancy. IMPRESSION: Further evaluation is suggested for possible masses in the right breast. RECOMMENDATION: Diagnostic mammogram and possibly ultrasound of the right breast. (Code:[id number]-70M) The patient will be contacted regarding the findings, and additional imaging will be scheduled. BI-RADS CATEGORY  0: Incomplete: Need additional imaging evaluation. Electronically Signed: By: Dina  Arceo M.D. On: 10/24/2024 04:56   US  PELVIC COMPLETE WITH TRANSVAGINAL Result Date: 10/17/2024 CLINICAL DATA:  Abnormal uterine  bleeding EXAM: TRANSABDOMINAL AND TRANSVAGINAL ULTRASOUND OF PELVIS TECHNIQUE: Both transabdominal and transvaginal ultrasound examinations of the pelvis were performed. Transabdominal technique was performed for global imaging of the pelvis including uterus, ovaries, adnexal regions, and pelvic cul-de-sac. It was necessary to proceed with endovaginal exam following the transabdominal exam to visualize the endometrium and ovaries. COMPARISON:  CT abdomen and pelvis dated 06/05/2024, ultrasound pelvis dated 05/17/2022 FINDINGS: Uterus Measurements: 11.0 cm in sagittal dimension. Peripherally hyperechoic, heterogeneously hypoechoic mass in the left submucosal uterine body measures 5.5 x 3.1 x 2.6 cm, previously 2.6 x 2.0 x 1.9 cm, and protrudes into the endometrial canal. Endometrium Endometrium is distorted by the submucosal mass and not well seen. Visualized portion measures 7 mm. Right ovary Measurements: 2.5 x 1.6 x 1.6 cm = volume: 3.4 mL. Possible thick-walled cystic structure along the posterior aspect of the ovary measures 1.4 x 1.3 x 1.2 cm, likely corpus luteum. Left ovary Suboptimally evaluated due to overlying bowel gas. Other findings:  No abnormal free fluid. IMPRESSION: 1. Interval increase in size of a left submucosal uterine body mass, now measuring up to 5.5 cm, previously 2.6 cm, which protrudes into the endometrial canal. This may represent a submucosal leiomyoma, however given interval increase in size, malignant degeneration is considered a possibility. 2. Endometrium is distorted by the submucosal mass and not well seen. Visualized portion measures 7 mm. 3. Normal right ovary containing presumed corpus luteum. Suboptimally evaluated left ovary due to overlying bowel gas. Electronically Signed   By: Limin  Xu M.D.   On: 10/17/2024 08:55    Disposition: Discharge disposition: 01-Home or Self Care       Discharge Instructions     Call MD for:  persistant nausea and vomiting   Complete  by: As directed    Call MD for:  redness, tenderness, or signs of infection (pain, swelling, redness, odor or green/yellow discharge around incision site)   Complete by: As directed    Call MD for:  severe uncontrolled pain   Complete by: As directed    Call MD for:  temperature >100.4   Complete by: As directed    Diet - low sodium heart healthy   Complete by: As directed    Discharge patient   Complete by: As directed    Discharge disposition: 01-Home or Self Care   Discharge patient date: 10/29/2024   Driving Restrictions   Complete by: As directed    None while taking narcotic pain meds   Increase activity slowly   Complete by: As directed    Lifting restrictions   Complete by: As directed    Nothing > 20 lbs x 2 weeks   Sexual Activity Restrictions   Complete by: As directed    None x 12 weeks        Follow-up Information     [facility] for [facility] Follow up in 2 week(s).   Specialty: Obstetrics and Gynecology Why: postop check, they will call you with an  appointment Contact information: [address] [place]  [phone]                 Signed: Glenys GORMAN Birk 10/29/2024, 3:01 PM

## 2024-10-30 ENCOUNTER — Other Ambulatory Visit: Payer: Self-pay | Admitting: Family Medicine

## 2024-10-30 DIAGNOSIS — R928 Other abnormal and inconclusive findings on diagnostic imaging of breast: Secondary | ICD-10-CM

## 2024-11-01 LAB — TYPE AND SCREEN
ABO/RH(D): B POS
Antibody Screen: NEGATIVE
Unit division: 0
Unit division: 0

## 2024-11-01 LAB — BPAM RBC
Blood Product Expiration Date: 202602112359
Blood Product Expiration Date: 202602132359
ISSUE DATE / TIME: 202601200844
ISSUE DATE / TIME: 202601200844
Unit Type and Rh: 7300
Unit Type and Rh: 7300

## 2024-11-13 ENCOUNTER — Ambulatory Visit: Admitting: Family Medicine

## 2024-11-13 ENCOUNTER — Encounter: Payer: Self-pay | Admitting: Family Medicine

## 2024-11-13 VITALS — BP 122/81 | HR 85

## 2024-11-13 DIAGNOSIS — D509 Iron deficiency anemia, unspecified: Secondary | ICD-10-CM

## 2024-11-13 DIAGNOSIS — N871 Moderate cervical dysplasia: Secondary | ICD-10-CM

## 2024-11-13 DIAGNOSIS — Z90710 Acquired absence of both cervix and uterus: Secondary | ICD-10-CM

## 2024-11-13 NOTE — Assessment & Plan Note (Signed)
 Final pathology is clear of CIN.

## 2024-11-13 NOTE — Assessment & Plan Note (Signed)
 Should improve with hysterectomy.

## 2024-11-13 NOTE — Assessment & Plan Note (Signed)
 Doing well. F/u in 4 weeks for vaginal check.

## 2024-11-13 NOTE — Progress Notes (Signed)
" ° °  Subjective:    Patient ID: Lynn Wilcox is a 44 y.o. female presenting with Routine Post Op  on 11/13/2024  HPI: S/p TVH. Pathology reviewed. She is feeling well. Occasional pain. No more bleeding.  Review of Systems  Constitutional:  Negative for chills and fever.  Respiratory:  Negative for shortness of breath.   Cardiovascular:  Negative for chest pain.  Gastrointestinal:  Negative for abdominal pain, nausea and vomiting.  Genitourinary:  Negative for dysuria.  Skin:  Negative for rash.      Objective:    BP 122/81   Pulse 85   LMP 10/20/2024  Physical Exam Exam conducted with a chaperone present.  Constitutional:      General: She is not in acute distress.    Appearance: She is well-developed.  HENT:     Head: Normocephalic and atraumatic.  Eyes:     General: No scleral icterus. Cardiovascular:     Rate and Rhythm: Normal rate.  Pulmonary:     Effort: Pulmonary effort is normal.  Abdominal:     Palpations: Abdomen is soft.  Musculoskeletal:     Cervical back: Neck supple.  Skin:    General: Skin is warm and dry.  Neurological:     Mental Status: She is alert and oriented to person, place, and time.         Assessment & Plan:   Problem List Items Addressed This Visit       Unprioritized   Microcytic anemia   Should improve with hysterectomy.      Dysplasia of cervix, high grade CIN 2 - Primary   Final pathology is clear of CIN.      Status post hysterectomy   Doing well. F/u in 4 weeks for vaginal check.       Return in about 4 weeks (around 12/11/2024) for postop check.  Glenys GORMAN Birk, MD 11/13/2024 1:56 PM   "

## 2024-12-11 ENCOUNTER — Encounter: Admitting: Family Medicine
